# Patient Record
Sex: Female | Born: 1990 | Race: White
Health system: Southern US, Community
[De-identification: ages and names within clinical notes are randomized; demographics above are authoritative.]

## PROBLEM LIST (undated history)

## (undated) ENCOUNTER — Ambulatory Visit: Admission: RE | Payer: No Typology Code available for payment source | Source: Home / Self Care | Admitting: Obstetrics

## (undated) ENCOUNTER — Emergency Department
Disposition: A | Discharge status: Home / Self Care | Payer: Self-pay | Attending: Emergency Medicine | Admitting: Emergency Medicine

## (undated) ENCOUNTER — Observation Stay: Admission: EM | Payer: Medicaid Other | Source: Home / Self Care

## (undated) DIAGNOSIS — J349 Unspecified disorder of nose and nasal sinuses: Secondary | ICD-10-CM

## (undated) DIAGNOSIS — C801 Malignant (primary) neoplasm, unspecified: Secondary | ICD-10-CM

## (undated) DIAGNOSIS — T148XXA Other injury of unspecified body region, initial encounter: Secondary | ICD-10-CM

## (undated) DIAGNOSIS — R112 Nausea with vomiting, unspecified: Secondary | ICD-10-CM

## (undated) DIAGNOSIS — J45909 Unspecified asthma, uncomplicated: Secondary | ICD-10-CM

## (undated) DIAGNOSIS — F4310 Post-traumatic stress disorder, unspecified: Secondary | ICD-10-CM

## (undated) DIAGNOSIS — F419 Anxiety disorder, unspecified: Secondary | ICD-10-CM

## (undated) DIAGNOSIS — H9390 Unspecified disorder of ear, unspecified ear: Secondary | ICD-10-CM

## (undated) DIAGNOSIS — S0990XA Unspecified injury of head, initial encounter: Secondary | ICD-10-CM

## (undated) DIAGNOSIS — Z9889 Other specified postprocedural states: Secondary | ICD-10-CM

## (undated) DIAGNOSIS — K219 Gastro-esophageal reflux disease without esophagitis: Secondary | ICD-10-CM

## (undated) DIAGNOSIS — G43909 Migraine, unspecified, not intractable, without status migrainosus: Secondary | ICD-10-CM

## (undated) DIAGNOSIS — I10 Essential (primary) hypertension: Secondary | ICD-10-CM

## (undated) DIAGNOSIS — F1911 Other psychoactive substance abuse, in remission: Secondary | ICD-10-CM

## (undated) DIAGNOSIS — H9190 Unspecified hearing loss, unspecified ear: Secondary | ICD-10-CM

## (undated) DIAGNOSIS — K429 Umbilical hernia without obstruction or gangrene: Secondary | ICD-10-CM

## (undated) DIAGNOSIS — F53 Postpartum depression: Secondary | ICD-10-CM

## (undated) DIAGNOSIS — F319 Bipolar disorder, unspecified: Secondary | ICD-10-CM

## (undated) DIAGNOSIS — Z9109 Other allergy status, other than to drugs and biological substances: Secondary | ICD-10-CM

## (undated) DIAGNOSIS — R011 Cardiac murmur, unspecified: Secondary | ICD-10-CM

## (undated) DIAGNOSIS — K589 Irritable bowel syndrome without diarrhea: Secondary | ICD-10-CM

## (undated) DIAGNOSIS — M545 Low back pain, unspecified: Secondary | ICD-10-CM

## (undated) DIAGNOSIS — N83209 Unspecified ovarian cyst, unspecified side: Secondary | ICD-10-CM

## (undated) DIAGNOSIS — F32A Depression, unspecified: Secondary | ICD-10-CM

## (undated) DIAGNOSIS — B009 Herpesviral infection, unspecified: Secondary | ICD-10-CM

## (undated) DIAGNOSIS — B1910 Unspecified viral hepatitis B without hepatic coma: Secondary | ICD-10-CM

## (undated) DIAGNOSIS — N390 Urinary tract infection, site not specified: Secondary | ICD-10-CM

## (undated) DIAGNOSIS — J392 Other diseases of pharynx: Secondary | ICD-10-CM

## (undated) DIAGNOSIS — G8929 Other chronic pain: Secondary | ICD-10-CM

## (undated) DIAGNOSIS — R1310 Dysphagia, unspecified: Secondary | ICD-10-CM

## (undated) DIAGNOSIS — T8859XA Other complications of anesthesia, initial encounter: Secondary | ICD-10-CM

## (undated) HISTORY — PX: TYMPANOSTOMY TUBE PLACEMENT: SHX32

## (undated) HISTORY — DX: Unspecified viral hepatitis B without hepatic coma: B19.10

---
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
## 2010-01-01 LAB — URINALYSIS
Bilirubin, UA: NEGATIVE
Blood, UA: NEGATIVE
Glucose, UA: NEGATIVE
Ketones UA: NEGATIVE
Leukocyte Esterase, UA: NEGATIVE
Nitrate: NEGATIVE
Protein, UR: NEGATIVE
Specific Gravity, UR: 1.007 (ref 1.000–1.035)
Urobilinogen, UA: NORMAL
pH, Urine: 7.5 (ref 5.0–8.0)

## 2010-01-01 LAB — CBC AND DIFFERENTIAL
BASOPHILS %: 0.1 % (ref 0.0–2.0)
Baso(Absolute): 0.01 10*3/uL (ref 0.00–0.20)
Eosinophils %: 1.6 % (ref 0.0–6.0)
Eosinophils Absolute: 0.13 10*3/uL (ref 0.10–0.30)
Hematocrit: 37.4 % (ref 27.0–49.5)
Hemoglobin: 12.5 g/dL (ref 11.7–15.5)
Immature Granulocytes #: 0.01 10*3/uL (ref 0.00–0.05)
Immature Granulocytes %: 0.1 % — ABNORMAL HIGH (ref 0.0–0.0)
Lymphocytes Absolute: 4.36 10*3/uL (ref 1.00–4.80)
Lymphocytes Relative: 55.2 % — ABNORMAL HIGH (ref 25.0–55.0)
MCH: 27.3 pg (ref 27.0–34.0)
MCHC: 33.4 g/dL (ref 32.0–36.0)
MCV: 81.7 fL (ref 80–100)
MPV: 9.6 fL (ref 9.0–13.0)
Monocytes Absolute: 0.42 10*3/uL (ref 0.10–1.20)
Monocytes Relative %: 5.3 % (ref 1.0–8.0)
Neutrophils Absolute: 2.98 10*3/uL (ref 1.80–7.70)
Neutrophils Relative %: 37.8 % — ABNORMAL LOW (ref 49.0–69.0)
Nucleated RBC %: 0 /100WBC (ref 0.0–0.0)
Nucleted RBC #: 0 10*3/uL (ref 0.00–0.00)
Platelets: 202 10*3/uL (ref 150–400)
RBC: 4.58 M/uL (ref 3.80–5.40)
RDW: 13.9 % (ref 11.0–14.0)
WBC: 7.9 10*3/uL (ref 4.80–10.80)

## 2010-01-01 LAB — CHLAMYDIA TRACH/GC AMPLIFIED DETECTION
C. Trachomatis Amplified: NEGATIVE
N. Gonorrhoeae Amplified: NEGATIVE

## 2010-01-01 LAB — COMPREHENSIVE METABOLIC PANEL
ALT: 32 U/L (ref 7–56)
AST (SGOT): 20 U/L (ref 10–40)
Albumin, Synovial: 3.4 g/dL — ABNORMAL LOW (ref 3.9–5.0)
Alkaline Phosphatase: 44 U/L (ref 38–126)
BUN / Creatinine Ratio: 13 (ref 8–20)
BUN: 8 mg/dL (ref 6–20)
Bilirubin, Total: <0.1 mg/dL — ABNORMAL LOW (ref 0.2–1.3)
CO2: 26 mmol/L (ref 21.0–31.0)
Calcium: 8.7 mg/dL (ref 8.4–10.2)
Chloride: 107 mmol/L (ref 101–111)
Creatinine: 0.65 mg/dL (ref 0.52–1.04)
EGFR: >60 mL/min/{1.73_m2}
EGFR: >60 mL/min/{1.73_m2}
Glucose: 80 mg/dL (ref 70–100)
Potassium: 4.1 mmol/L (ref 3.6–5.0)
Protein, Total: 5.8 g/dL — ABNORMAL LOW (ref 6.3–8.2)
Sodium: 140 mmol/L (ref 135–145)

## 2010-01-01 LAB — SEDIMENTATION RATE, AUTOMATED: Sed Rate: 3 (ref 0–20)

## 2010-01-01 LAB — STREP A ANTIGEN (THROAT): Streptococcus A AG: NEGATIVE

## 2010-01-01 LAB — HCG, QUALITATIVE URINE: Urine HCG Qualitative: NEGATIVE

## 2010-01-01 LAB — C-REACTIVE PROTEIN, QUANTITATIVE: C-Reactive Protein, Quantitative: <0.5 mg/dL (ref 0.0–1.0)

## 2010-01-01 LAB — AMYLASE: Amylase: 32 U/L (ref 29–110)

## 2010-01-01 LAB — LIPASE: Lipase: 95 U/L (ref 23–300)

## 2010-07-09 ENCOUNTER — Emergency Department
Admission: EM | Admit: 2010-07-09 | Disposition: A | Discharge status: Home / Self Care | Payer: Self-pay | Source: Emergency Department | Admitting: Emergency Medicine

## 2010-07-09 LAB — COMPREHENSIVE METABOLIC PANEL
ALT: 149 U/L — ABNORMAL HIGH (ref 7–56)
AST (SGOT): 82 U/L — ABNORMAL HIGH (ref 10–40)
Albumin, Synovial: 4.4 g/dL (ref 3.9–5.0)
Alkaline Phosphatase: 68 U/L (ref 38–126)
BUN / Creatinine Ratio: 23 — ABNORMAL HIGH (ref 8–20)
BUN: 15 mg/dL (ref 6–20)
Bilirubin, Total: 0.8 mg/dL (ref 0.2–1.3)
CO2: 24 mmol/L (ref 21.0–31.0)
Calcium: 9.2 mg/dL (ref 8.4–10.2)
Chloride: 103 mmol/L (ref 101–111)
Creatinine: 0.64 mg/dL (ref 0.52–1.04)
EGFR: >60 mL/min/{1.73_m2}
EGFR: >60 mL/min/{1.73_m2}
Glucose: 90 mg/dL (ref 70–100)
Potassium: 4.2 mmol/L (ref 3.6–5.0)
Protein, Total: 7.3 g/dL (ref 6.3–8.2)
Sodium: 138 mmol/L (ref 135–145)

## 2010-07-09 LAB — CBC AND DIFFERENTIAL
BASOPHILS %: 0.1 % (ref 0.0–2.0)
Baso(Absolute): 0.01 10*3/uL (ref 0.00–0.20)
Eosinophils %: 0.5 % (ref 0.0–6.0)
Eosinophils Absolute: 0.04 10*3/uL — ABNORMAL LOW (ref 0.10–0.30)
Hematocrit: 40.2 % (ref 27.0–49.5)
Hemoglobin: 13.7 g/dL (ref 11.7–15.5)
Immature Granulocytes #: 0.01 10*3/uL (ref 0.00–0.05)
Immature Granulocytes %: 0.1 % — ABNORMAL HIGH (ref 0.0–0.0)
Lymphocytes Absolute: 2.27 10*3/uL (ref 1.00–4.80)
Lymphocytes Relative: 29.6 % (ref 25.0–55.0)
MCH: 28.1 pg (ref 27.0–34.0)
MCHC: 34.1 g/dL (ref 32.0–36.0)
MCV: 82.4 fL (ref 80–100)
MPV: 9.3 fL (ref 9.0–13.0)
Monocytes Absolute: 0.43 10*3/uL (ref 0.10–1.20)
Monocytes Relative %: 5.6 % (ref 1.0–8.0)
Neutrophils Absolute: 4.91 10*3/uL (ref 1.80–7.70)
Neutrophils Relative %: 64.2 % (ref 49.0–69.0)
Nucleated RBC %: 0 /100WBC (ref 0.0–0.0)
Nucleted RBC #: 0 10*3/uL (ref 0.00–0.00)
Platelets: 258 10*3/uL (ref 150–400)
RBC: 4.88 M/uL (ref 3.80–5.40)
RDW: 13.6 % (ref 11.0–14.0)
WBC: 7.66 10*3/uL (ref 4.80–10.80)

## 2010-07-09 LAB — URINALYSIS
Bilirubin, UA: NEGATIVE
Glucose, UA: NEGATIVE
Leukocyte Esterase, UA: NEGATIVE
Nitrate: NEGATIVE
Specific Gravity, UR: 1.025 (ref 1.000–1.035)
Urobilinogen, UA: NORMAL
pH, Urine: 6 (ref 5.0–8.0)

## 2010-07-09 LAB — HCG, QUALITATIVE URINE: Urine HCG Qualitative: NEGATIVE

## 2010-07-09 LAB — STREP A ANTIGEN (THROAT): Streptococcus A AG: NEGATIVE

## 2010-07-09 LAB — URINALYSIS WITH MICROSCOPIC

## 2010-08-05 ENCOUNTER — Emergency Department
Admission: EM | Admit: 2010-08-05 | Disposition: A | Discharge status: Home / Self Care | Payer: Self-pay | Source: Emergency Department | Admitting: Pediatric Emergency Medicine

## 2010-08-05 LAB — CBC AND DIFFERENTIAL
BASOPHILS %: 0.1 % (ref 0.0–2.0)
Baso(Absolute): 0.01 10*3/uL (ref 0.00–0.20)
Eosinophils %: 1.2 % (ref 0.0–6.0)
Eosinophils Absolute: 0.11 10*3/uL (ref 0.10–0.30)
Hematocrit: 38.8 % (ref 27.0–49.5)
Hemoglobin: 13.2 g/dL (ref 11.7–15.5)
Immature Granulocytes #: 0.02 10*3/uL (ref 0.00–0.05)
Immature Granulocytes %: 0.2 % — ABNORMAL HIGH (ref 0.0–0.0)
Lymphocytes Absolute: 2.96 10*3/uL (ref 1.00–4.80)
Lymphocytes Relative: 32 % (ref 25.0–55.0)
MCH: 27.9 pg (ref 27.0–34.0)
MCHC: 34 g/dL (ref 32.0–36.0)
MCV: 82 fL (ref 80–100)
MPV: 9.5 fL (ref 9.0–13.0)
Monocytes Absolute: 0.6 10*3/uL (ref 0.10–1.20)
Monocytes Relative %: 6.5 % (ref 1.0–8.0)
Neutrophils Absolute: 5.57 10*3/uL (ref 1.80–7.70)
Neutrophils Relative %: 60.2 % (ref 49.0–69.0)
Nucleated RBC %: 0 /100WBC (ref 0.0–0.0)
Nucleted RBC #: 0 10*3/uL (ref 0.00–0.00)
Platelets: 234 10*3/uL (ref 150–400)
RBC: 4.73 M/uL (ref 3.80–5.40)
RDW: 13.2 % (ref 11.0–14.0)
WBC: 9.25 10*3/uL (ref 4.80–10.80)

## 2010-08-05 LAB — URINALYSIS
Bilirubin, UA: NEGATIVE
Blood, UA: NEGATIVE
Glucose, UA: NEGATIVE
Leukocyte Esterase, UA: NEGATIVE
Nitrate: NEGATIVE
Protein, UR: NEGATIVE
Specific Gravity, UR: 1.007 (ref 1.000–1.035)
Urobilinogen, UA: NORMAL
pH, Urine: 7.5 (ref 5.0–8.0)

## 2010-08-05 LAB — COMPREHENSIVE METABOLIC PANEL
ALT: 30 U/L (ref 7–56)
AST (SGOT): 23 U/L (ref 10–40)
Albumin, Synovial: 3.9 g/dL (ref 3.9–5.0)
Alkaline Phosphatase: 56 U/L (ref 38–126)
BUN / Creatinine Ratio: 18 (ref 8–20)
BUN: 10 mg/dL (ref 6–20)
Bilirubin, Total: 0.7 mg/dL (ref 0.2–1.3)
CO2: 23 mmol/L (ref 21.0–31.0)
Calcium: 9 mg/dL (ref 8.4–10.2)
Chloride: 106 mmol/L (ref 101–111)
Creatinine: 0.55 mg/dL (ref 0.52–1.04)
EGFR: >60 mL/min/{1.73_m2}
EGFR: >60 mL/min/{1.73_m2}
Glucose: 86 mg/dL (ref 70–100)
Potassium: 4.4 mmol/L (ref 3.6–5.0)
Protein, Total: 7 g/dL (ref 6.3–8.2)
Sodium: 137 mmol/L (ref 135–145)

## 2010-08-05 LAB — HCG, QUALITATIVE URINE: Urine HCG Qualitative: NEGATIVE

## 2010-08-05 LAB — D-DIMER, QUANTITATIVE: D-Dimer, Quant.: <0.22 ug/mL-FEU (ref 0.00–0.51)

## 2010-08-07 LAB — CHLAMYDIA TRACH/GC AMPLIFIED DETECTION
C. Trachomatis Amplified: NEGATIVE
N. Gonorrhoeae Amplified: NEGATIVE

## 2010-08-16 ENCOUNTER — Emergency Department
Admission: EM | Admit: 2010-08-16 | Disposition: A | Discharge status: Home / Self Care | Payer: Self-pay | Source: Emergency Department | Admitting: Emergency Medicine

## 2010-08-16 LAB — HCG, QUANTITATIVE, PREGNANCY: hCG, Quant.: 4810.4 m[IU]/mL — ABNORMAL HIGH (ref 0.00–6.10)

## 2010-08-16 LAB — COMPREHENSIVE METABOLIC PANEL
ALT: 29 U/L (ref 7–56)
AST (SGOT): 22 U/L (ref 10–40)
Albumin, Synovial: 3.8 g/dL — ABNORMAL LOW (ref 3.9–5.0)
Alkaline Phosphatase: 53 U/L (ref 38–126)
BUN / Creatinine Ratio: 19 (ref 8–20)
BUN: 12 mg/dL (ref 6–20)
Bilirubin, Total: 0.2 mg/dL (ref 0.2–1.3)
CO2: 22 mmol/L (ref 21.0–31.0)
Calcium: 8.8 mg/dL (ref 8.4–10.2)
Chloride: 108 mmol/L (ref 101–111)
Creatinine: 0.6 mg/dL (ref 0.52–1.04)
EGFR: >60 mL/min/{1.73_m2}
EGFR: >60 mL/min/{1.73_m2}
Glucose: 77 mg/dL (ref 70–100)
Potassium: 4 mmol/L (ref 3.6–5.0)
Protein, Total: 6.2 g/dL — ABNORMAL LOW (ref 6.3–8.2)
Sodium: 140 mmol/L (ref 135–145)

## 2010-08-16 LAB — CBC AND DIFFERENTIAL
BASOPHILS %: 0.1 % (ref 0.0–2.0)
Baso(Absolute): 0.01 10*3/uL (ref 0.00–0.20)
Eosinophils %: 1.2 % (ref 0.0–6.0)
Eosinophils Absolute: 0.14 10*3/uL (ref 0.10–0.30)
Hematocrit: 37.3 % (ref 27.0–49.5)
Hemoglobin: 12.7 g/dL (ref 11.7–15.5)
Immature Granulocytes #: 0.03 10*3/uL (ref 0.00–0.05)
Immature Granulocytes %: 0.3 % — ABNORMAL HIGH (ref 0.0–0.0)
Lymphocytes Absolute: 4.33 10*3/uL (ref 1.00–4.80)
Lymphocytes Relative: 36.7 % (ref 25.0–55.0)
MCH: 28 pg (ref 27.0–34.0)
MCHC: 34 g/dL (ref 32.0–36.0)
MCV: 82.3 fL (ref 80–100)
MPV: 9.4 fL (ref 9.0–13.0)
Monocytes Absolute: 0.73 10*3/uL (ref 0.10–1.20)
Monocytes Relative %: 6.2 % (ref 1.0–8.0)
Neutrophils Absolute: 6.6 10*3/uL (ref 1.80–7.70)
Neutrophils Relative %: 55.8 % (ref 49.0–69.0)
Nucleated RBC %: 0.1 /100WBC — ABNORMAL HIGH (ref 0.0–0.0)
Nucleted RBC #: 0.02 10*3/uL — ABNORMAL HIGH (ref 0.00–0.00)
Platelets: 251 10*3/uL (ref 150–400)
RBC: 4.53 M/uL (ref 3.80–5.40)
RDW: 13.3 % (ref 11.0–14.0)
WBC: 11.81 10*3/uL — ABNORMAL HIGH (ref 4.80–10.80)

## 2010-08-16 LAB — URINALYSIS
Bilirubin, UA: NEGATIVE
Blood, UA: NEGATIVE
Glucose, UA: NEGATIVE
Ketones UA: NEGATIVE
Leukocyte Esterase, UA: NEGATIVE
Nitrate: NEGATIVE
Protein, UR: NEGATIVE
Specific Gravity, UR: 1.004 (ref 1.000–1.035)
Urobilinogen, UA: NORMAL
pH, Urine: 6 (ref 5.0–8.0)

## 2010-08-16 LAB — ABO/RH: ABO Rh: O POS

## 2010-08-16 LAB — HCG, SERUM, QUALITATIVE: Hcg Qualitative: POSITIVE

## 2010-08-16 LAB — ANTIBODY SCREEN: AB Screen Gel: NEGATIVE

## 2010-08-16 LAB — HCG, QUALITATIVE URINE: Urine HCG Qualitative: POSITIVE

## 2010-08-25 ENCOUNTER — Emergency Department
Admit: 2010-08-25 | Disposition: A | Discharge status: Home / Self Care | Payer: Self-pay | Source: Emergency Department | Admitting: Emergency Medicine

## 2010-08-25 LAB — GROUP A STREP, RAPID ANTIGEN: Group A Strep, Rapid Antigen: NEGATIVE

## 2010-08-27 LAB — THROAT CULTURE: Culture Throat: NORMAL

## 2010-11-23 ENCOUNTER — Ambulatory Visit
Admission: AD | Admit: 2010-11-23 | Disposition: A | Discharge status: Home / Self Care | Payer: Self-pay | Source: Ambulatory Visit | Attending: Obstetrics & Gynecology | Admitting: Obstetrics & Gynecology

## 2010-11-23 LAB — URINALYSIS
Bilirubin, UA: NEGATIVE
Blood, UA: NEGATIVE
Glucose, UA: NEGATIVE
Leukocyte Esterase, UA: NEGATIVE
Nitrate: NEGATIVE
Protein, UR: NEGATIVE
Specific Gravity, UR: 1.032 (ref 1.000–1.035)
Urobilinogen, UA: NORMAL
pH, Urine: 6 (ref 5.0–8.0)

## 2010-12-02 ENCOUNTER — Emergency Department
Admission: EM | Admit: 2010-12-02 | Disposition: A | Discharge status: Home / Self Care | Payer: Self-pay | Source: Emergency Department | Admitting: Emergency Medicine

## 2011-03-22 ENCOUNTER — Ambulatory Visit
Admission: AD | Admit: 2011-03-22 | Disposition: A | Discharge status: Home / Self Care | Payer: Self-pay | Source: Ambulatory Visit | Attending: Obstetrics & Gynecology | Admitting: Obstetrics & Gynecology

## 2011-04-05 DIAGNOSIS — B977 Papillomavirus as the cause of diseases classified elsewhere: Secondary | ICD-10-CM

## 2011-04-05 HISTORY — DX: Papillomavirus as the cause of diseases classified elsewhere: B97.7

## 2011-04-07 ENCOUNTER — Inpatient Hospital Stay
Admission: RE | Admit: 2011-04-07 | Disposition: A | Discharge status: Home / Self Care | Payer: Self-pay | Source: Ambulatory Visit

## 2011-04-07 ENCOUNTER — Ambulatory Visit
Admission: RE | Admit: 2011-04-07 | Discharge: 2011-04-07 | Disposition: A | Discharge status: Home / Self Care | Payer: Self-pay | Source: Ambulatory Visit | Attending: Obstetrics & Gynecology | Admitting: Obstetrics & Gynecology

## 2011-04-07 LAB — CBC AND DIFFERENTIAL
Basophils Absolute Automated: 0.01 10*3/uL (ref 0.00–0.20)
Basophils Automated: 0 % (ref 0–2)
Eosinophils Absolute Automated: 0.14 10*3/uL (ref 0.00–0.70)
Eosinophils Automated: 1 % (ref 0–5)
Hematocrit: 30.1 % — ABNORMAL LOW (ref 37.0–47.0)
Hgb: 9.6 g/dL — ABNORMAL LOW (ref 12.0–16.0)
Immature Granulocytes Absolute: 0.06 10*3/uL — ABNORMAL HIGH
Immature Granulocytes: 1 % (ref 0–1)
Lymphocytes Absolute Automated: 3.6 10*3/uL (ref 0.50–4.40)
Lymphocytes Automated: 32 % (ref 15–41)
MCH: 26.4 pg — ABNORMAL LOW (ref 28.0–32.0)
MCHC: 31.9 g/dL — ABNORMAL LOW (ref 32.0–36.0)
MCV: 82.9 fL (ref 80.0–100.0)
MPV: 9.3 fL — ABNORMAL LOW (ref 9.4–12.3)
Monocytes Absolute Automated: 0.82 10*3/uL (ref 0.00–1.20)
Monocytes: 7 % (ref 0–11)
Neutrophils Absolute: 6.76 10*3/uL (ref 1.80–8.10)
Neutrophils: 60 % (ref 52–75)
Nucleated RBC: 0 /100 WBC
Platelets: 214 10*3/uL (ref 140–400)
RBC: 3.63 10*6/uL — ABNORMAL LOW (ref 4.20–5.40)
RDW: 14 % (ref 12–15)
WBC: 11.33 10*3/uL — ABNORMAL HIGH (ref 3.50–10.80)

## 2011-04-07 LAB — TYPE AND SCREEN
AB Screen Gel: NEGATIVE
ABO Rh: O POS

## 2011-04-08 LAB — RAPID DRUG SCREEN, URINE
Barbiturate Screen, UR: NOT DETECTED
Benzodiazepine Screen, UR: NOT DETECTED
Cannabinoid Screen, UR: NOT DETECTED
Cocaine, UR: NOT DETECTED
Opiate Screen, UR: NOT DETECTED
PCP Screen, UR: NOT DETECTED
Urine Amphetamine Screen: NOT DETECTED

## 2011-05-06 ENCOUNTER — Emergency Department
Admission: EM | Admit: 2011-05-06 | Disposition: A | Discharge status: Home / Self Care | Payer: Self-pay | Source: Emergency Department | Admitting: Pediatric Emergency Medicine

## 2011-05-06 LAB — URINALYSIS
Bilirubin, UA: NEGATIVE
Blood, UA: NEGATIVE
Glucose, UA: NEGATIVE
Ketones UA: NEGATIVE
Nitrate: NEGATIVE
Protein, UR: NEGATIVE
Specific Gravity, UR: 1.012 (ref 1.000–1.035)
Urobilinogen, UA: NORMAL
pH, Urine: 6.5 (ref 5.0–8.0)

## 2011-05-06 LAB — URINALYSIS WITH MICROSCOPIC

## 2011-05-06 LAB — INFLUENZA ANTIGEN
Influenza A: NEGATIVE
Influenza B: NEGATIVE

## 2011-05-06 LAB — HCG, QUALITATIVE URINE: Urine HCG Qualitative: NEGATIVE

## 2011-05-06 LAB — STREP A ANTIGEN (THROAT): Streptococcus A AG: NEGATIVE

## 2011-08-25 ENCOUNTER — Emergency Department
Admission: EM | Admit: 2011-08-25 | Discharge: 2011-08-25 | Disposition: A | Discharge status: Home / Self Care | Payer: Self-pay | Attending: Emergency Medical Services | Admitting: Emergency Medical Services

## 2011-08-25 ENCOUNTER — Emergency Department: Payer: Self-pay

## 2011-08-25 DIAGNOSIS — Z880 Allergy status to penicillin: Secondary | ICD-10-CM | POA: Insufficient documentation

## 2011-08-25 DIAGNOSIS — F17200 Nicotine dependence, unspecified, uncomplicated: Secondary | ICD-10-CM | POA: Insufficient documentation

## 2011-08-25 DIAGNOSIS — G8929 Other chronic pain: Secondary | ICD-10-CM | POA: Insufficient documentation

## 2011-08-25 DIAGNOSIS — N949 Unspecified condition associated with female genital organs and menstrual cycle: Secondary | ICD-10-CM | POA: Insufficient documentation

## 2011-08-25 DIAGNOSIS — Z91040 Latex allergy status: Secondary | ICD-10-CM | POA: Insufficient documentation

## 2011-08-25 DIAGNOSIS — Z882 Allergy status to sulfonamides status: Secondary | ICD-10-CM | POA: Insufficient documentation

## 2011-08-25 DIAGNOSIS — Z881 Allergy status to other antibiotic agents status: Secondary | ICD-10-CM | POA: Insufficient documentation

## 2011-08-25 DIAGNOSIS — J40 Bronchitis, not specified as acute or chronic: Secondary | ICD-10-CM | POA: Insufficient documentation

## 2011-08-25 HISTORY — DX: Anxiety disorder, unspecified: F41.9

## 2011-08-25 LAB — URINALYSIS
Bilirubin, UA: NEGATIVE
Glucose, UA: NEGATIVE
Leukocyte Esterase, UA: NEGATIVE
Nitrite, UA: NEGATIVE
Protein, UR: NEGATIVE
Specific Gravity UA: 1.025 (ref 1.001–1.035)
Urine pH: 6 (ref 5.0–8.0)
Urobilinogen, UA: 1 mg/dL

## 2011-08-25 LAB — URINE MICROSCOPIC

## 2011-08-25 LAB — URINE HCG QUALITATIVE: Urine HCG Qualitative: NEGATIVE

## 2011-08-25 MED ORDER — AZITHROMYCIN 250 MG PO TABS
250.00 mg | ORAL_TABLET | Freq: Every day | ORAL | Status: AC
Start: 2011-08-25 — End: 2011-08-31

## 2011-08-25 NOTE — Discharge Instructions (Signed)
Bronchitis    You have been diagnosed with bronchitis.    Bronchitis is an irritation of the large breathing tubes. It can be caused by tobacco smoke, air pollution, or an infection. Patients with bronchitis are short of breath and may cough up green or yellow mucous. These symptoms are usually worse at night, when lying flat and in wet weather. Most people with bronchitis do not need antibiotics. If your doctor prescribes antibiotics, fill the prescription and take all the medicine according to the instructions.    Bronchitis is usually treated with medicine to help stop coughing. An inhaler with Albuterol or Ventolin is sometimes used to help with cough. It is best to use the inhaler with a spacer to help the medicine reach your lungs. The doctor can prescribe a spacer.    Bronchitis is usually caused by a virus. Antibiotics do not kill viruses. In fact, antibiotics do not affect viruses in any way. In the past, some doctors prescribed antibiotics for people with bronchitis. We now know that antibiotics are not helpful for most bronchitis patients. Patients who might need antibiotics are those with lung problems that don't go away, like emphysema or COPD.    Your coughing and wheezing might last for 2 or 3 weeks! The symptoms should get better over this time period and not worse.    Do not smoke. Research shows that smoking causes heart disease, cancer, and birth defects. Avoiding smoking will help your asthma. If you smoke, ask your doctor for ideas about how to stop.   If you do not smoke, avoid others who do.    YOU SHOULD SEEK MEDICAL ATTENTION IMMEDIATELY, EITHER HERE OR AT THE NEAREST EMERGENCY DEPARTMENT, IF ANY OF THE FOLLOWING OCCURS:   You wheeze or have trouble breathing.   You have a fever (temperature higher than 101 F or 38.3 C), that won't go away.   You have chest pain.   You vomit or cannot keep liquids down or you feel weak or dizzy.   Your symptoms get worse over the next 2 or 3  days.      Bronchitis    You have been diagnosed with bronchitis.    Bronchitis is an irritation of the large breathing tubes. It can be caused by tobacco smoke, air pollution, or an infection. Patients with bronchitis are short of breath and may cough up green or yellow mucous. These symptoms are usually worse at night, when lying flat and in wet weather. Most people with bronchitis do not need antibiotics. If your doctor prescribes antibiotics, fill the prescription and take all the medicine according to the instructions.    Bronchitis is usually treated with medicine to help stop coughing. An inhaler with Albuterol or Ventolin is sometimes used to help with cough. It is best to use the inhaler with a spacer to help the medicine reach your lungs. The doctor can prescribe a spacer.    Bronchitis is usually caused by a virus. Antibiotics do not kill viruses. In fact, antibiotics do not affect viruses in any way. In the past, some doctors prescribed antibiotics for people with bronchitis. We now know that antibiotics are not helpful for most bronchitis patients. Patients who might need antibiotics are those with lung problems that don't go away, like emphysema or COPD.    Your coughing and wheezing might last for 2 or 3 weeks! The symptoms should get better over this time period and not worse.    Do not smoke. Research  shows that smoking causes heart disease, cancer, and birth defects. Avoiding smoking will help your asthma. If you smoke, ask your doctor for ideas about how to stop.   If you do not smoke, avoid others who do.    YOU SHOULD SEEK MEDICAL ATTENTION IMMEDIATELY, EITHER HERE OR AT THE NEAREST EMERGENCY DEPARTMENT, IF ANY OF THE FOLLOWING OCCURS:   You wheeze or have trouble breathing.   You have a fever (temperature higher than 101 F or 38.3 C), that won't go away.   You have chest pain.   You vomit or cannot keep liquids down or you feel weak or dizzy.   Your symptoms get worse over the  next 2 or 3 days.      Pelvic Pain    You have been diagnosed with pelvic pain.    Pelvic pain affects many women who come here for evaluation. Your doctor has checked for the most dangerous causes of pelvic pain, such as appendicitis, pelvic abscess ectopic (tubal) pregnancy, or other complications of pregnancy. You do not have any of these problems.    There are many other causes of pelvic pain that cannot be detected with the type of testing that can be done here today. Some diseases, such as uterine fibroids or endometriosis, may cause abnormal vaginal bleeding along with pain. These problems often require the doctor to look at the pelvic organs in the operating room under general anesthesia.    After examining you today and conducting a few tests, the doctor may suspect a specific cause of your pelvic pain, like infection of the uterus or fallopian tubes (known as pelvic inflammatory disease or PID). If so, treatment can be started immediately, even though it might take a few days to get all the test results back.    Cancer is a rare cause of acute pelvic pain, but it still needs to be considered as a possible cause of your symptoms. The emergency department or urgent care clinic is rarely able to diagnose or rule out cancer as the cause of pelvic pain. Cancer screening is not part of a routine emergency evaluation.    It is VERY IMPORTANT that you follow up with your regular doctor who can evaluate all possible causes of your pain.    At this time it is safe for you to go home.    You may need to return here or go to the nearest Emergency Department if you develop symptoms that might indicate you are having complications, such as worsening infection, vaginal bleeding, or some other problem.    Be sure to follow up with your gynecologist or regular doctor in the next few days. Tell your doctor about this visit.   If you do not have a regular doctor, make sure you tell the medical staff prior to  leaving here, so that we can help make these arrangements for you.    YOU SHOULD SEEK MEDICAL ATTENTION IMMEDIATELY, EITHER HERE OR AT THE NEAREST EMERGENCY DEPARTMENT, IF ANY OF THE FOLLOWING OCCURS:   Increasingly severe pain in the abdomen (belly), pelvis or back.   Increasingly large amounts of vaginal discharge or bleeding (more than one pad per hour), or passage of large blood clots.   Fever, chills, nausea, vomiting.   Dizziness, lightheadedness, or passing out.    [facility] Referral Tenna Delaine    You have been referred to Ob/Gyn specialists, Dr. Tenna Delaine and Dr. Shelby Dubin.    Call them at [phone] today or the next  business day to schedule an appointment.    When you call the office, tell them that you were evaluated in the Emergency Department and the doctor felt that you should be seen by an Ob/Gyn specialist.    They should be able to see you within 3 business days.    Their website is [email or web address]    [facility]  [phone]  [place] [address]    [facility] [address]  [place] [address]

## 2011-08-25 NOTE — ED Notes (Signed)
Reports "bacteria infection since i've been pregnant" referring to possible UTI/vaginal infection x approx 11 months and now reports 2 days of SOB and upper back pain.

## 2011-08-25 NOTE — ED Provider Notes (Addendum)
History     Chief Complaint   Patient presents with   . Urinary Tract Infection Symptoms   . Back Pain   . Shortness of Breath     Patient is a 21 y.o. female presenting with urinary tract infection, back pain, and shortness of breath.   Urinary Tract Infection Symptoms    Back Pain     Shortness of Breath   Associated symptoms include shortness of breath.   Pt reports that she has had a UTI since the birth of her sone 14 months ago. Last antibioitics were about 3 months ago.  She complains of chronic pain, but has not returned to see her doctor since then. Today she is also concerned about a cough and shortness of breath for the last few days, associated with some numbness to the upper back. Cough is dry and not accompanied by a fever. Has a hx of bronchitis, still smoking. No PMD.    Past Medical History   Diagnosis Date   . Childhood asthma    . Anxiety        History reviewed. No pertinent past surgical history.    No family history on file.    Social  History   Substance Use Topics   . Smoking status: Current Some Day Smoker   . Smokeless tobacco: Not on file   . Alcohol Use: No       Allergies   Allergen Reactions   . Amoxicillin Anaphylaxis   . Ceclor (Cefaclor) Anaphylaxis and Rash   . Macrodantin Anaphylaxis   . Penicillins Anaphylaxis   . Flagyl (Metronidazole) Itching   . Sulfa Antibiotics Itching   . Clindamycin Rash   . Latex Rash       Current/Home Medications    NAPROXEN SODIUM (ANAPROX) 220 MG TABLET    Take 220 mg by mouth 2 (two) times daily with meals.        Review of Systems   Respiratory: Positive for shortness of breath.    Musculoskeletal: Positive for back pain.   As above,  All other systems reviewed and are negative      Physical Exam    BP 121/74  Pulse 75  Temp(Src) 97 F (36.1 C) (Oral)  Resp 18  Ht 1.676 m  Wt 91.173 kg  BMI 32.44 kg/m2  SpO2 98%  LMP 08/22/2011    Physical Exam  Obese, vitals noted.   Constitutional: Vital signs reviewed. Well appearing.  Head:  Normocephalic, atraumatic  Eyes: No conjunctival injection. No discharge.  ENT: Mucous membranes moist, OP and TM's clear,no adenopathy.   Neck: Normal range of motion. Non-tender.  Respiratory/Chest: Clear to auscultation. No respiratory distress.   Cardiovascular: Regular rate and rhythm. No murmur.   Abdomen: Soft and non-tender. No guarding. No masses or hepatosplenomegaly.  LowerExtremity: No edema. No cyanosis.  Neurological: No focal motor deficits by observation. Speech normal. Memory normal.  Skin: Warm and dry. No rash.  Lymphatic: No cervical lymphadenopathy.  Psychiatric: Normal affect. Normal concentration.    MDM and ED Course     ED Medication Orders     None           MDM      Procedures    Clinical Impression & Disposition     Clinical Impression  Final diagnoses:   None        ED Disposition     None           New Prescriptions  No medications on file               Juliane Poot, MD  08/25/11 1340    Discussed resutls with patient. Recommend GYN follow up for chronic pelvic pain. Menstruating now. Will rx for bronchitis.    Juliane Poot, MD  08/25/11 [phone]

## 2011-08-26 NOTE — ED Notes (Signed)
Follow up call. Answering machine.

## 2011-09-06 ENCOUNTER — Emergency Department: Payer: Self-pay

## 2011-09-06 ENCOUNTER — Emergency Department
Admission: EM | Admit: 2011-09-06 | Discharge: 2011-09-06 | Disposition: A | Discharge status: Home / Self Care | Payer: Self-pay | Attending: Pediatric Emergency Medicine | Admitting: Pediatric Emergency Medicine

## 2011-09-06 DIAGNOSIS — R071 Chest pain on breathing: Secondary | ICD-10-CM | POA: Insufficient documentation

## 2011-09-06 DIAGNOSIS — F17200 Nicotine dependence, unspecified, uncomplicated: Secondary | ICD-10-CM | POA: Insufficient documentation

## 2011-09-06 LAB — ECG 12-LEAD
Atrial Rate: 55 {beats}/min
P Axis: 35 degrees
P-R Interval: 168 ms
Q-T Interval: 424 ms
QRS Duration: 86 ms
QTC Calculation (Bezet): 405 ms
R Axis: 37 degrees
T Axis: 24 degrees
Ventricular Rate: 55 {beats}/min

## 2011-09-06 LAB — URINE HCG QUALITATIVE: Urine HCG Qualitative: NEGATIVE

## 2011-09-06 LAB — URINALYSIS
Bilirubin, UA: NEGATIVE
Glucose, UA: NEGATIVE
Ketones UA: NEGATIVE
Nitrite, UA: NEGATIVE
Protein, UR: NEGATIVE
Specific Gravity UA: 1.02 (ref 1.001–1.035)
Urine pH: 6 (ref 5.0–8.0)
Urobilinogen, UA: 0.2 mg/dL

## 2011-09-06 LAB — URINE MICROSCOPIC

## 2011-09-06 MED ORDER — CYCLOBENZAPRINE HCL 5 MG PO TABS
5.00 mg | ORAL_TABLET | Freq: Three times a day (TID) | ORAL | Status: DC | PRN
Start: 2011-09-06 — End: 2011-09-11

## 2011-09-06 NOTE — ED Provider Notes (Signed)
Physician/Midlevel provider first contact with patient: 67    History     Chief Complaint   Patient presents with   . Chest Pain   . Abdominal Pain     HPI Comments: Catherine Spence female presents with chest pain for over past month.  Pt seen at Urgent care about a month ago, and diagnosed with bronchitis/sinus infection, and placed on Azithromycin.  Pt cough and congestion improved.  Pt has developed new chest pain over past 3 days.  Pt pain is in upper right central chest, radiates laterally and to back.  Pt pain is not reproducible.  Pain is worse with deep breaths.  Pt also with complaints of abdominal pain localized to suprapubic area and lower back for past few days.  Pt denies any urinary symptoms, and denies any vaginal discharge.    Patient is a 21 y.o. female presenting with chest pain and abdominal pain. The history is provided by the patient. No language interpreter was used.   Chest Pain  The chest pain began more  than 1 month ago. Duration of episode(s) is 3 days. Chest pain occurs constantly. The chest pain is unchanged. The pain is associated with breathing and coughing. At its most intense, the pain is at 8/10. The pain is currently at 5/10. The severity of the pain is moderate. The quality of the pain is described as sharp and dull. The pain radiates to the upper back. Chest pain is worsened by deep breathing. Primary symptoms include abdominal pain. Pertinent negatives for primary symptoms include no fever, no shortness of breath, no cough, no wheezing, no vomiting, no dizziness and no altered mental status.   The abdominal pain began more than 2 days ago. The abdominal pain has been unchanged since its onset. The abdominal pain is located in the suprapubic region. The abdominal pain does not radiate. The severity of the abdominal pain is 4/10. The abdominal pain is relieved by nothing.   Pertinent negatives for associated symptoms include no lower extremity edema, no near-syncope, no numbness and no  weakness. She tried NSAIDs (Azithromycin) for the symptoms. Risk factors include stress and obesity.   Her past medical history is significant for anxiety/panic attacks.   Pertinent negatives for past medical history include no diabetes. Past medical history comments: Asthma       Abdominal Pain  The primary symptoms of the illness include abdominal pain. The primary symptoms of the illness do not include fever, shortness of breath, vomiting, diarrhea, dysuria or vaginal bleeding. The current episode started more than 2 days ago. The onset of the illness was gradual. The problem has not changed since onset.  The patient states that she believes she is currently not pregnant. The patient has not had a change in bowel habit. Additional symptoms associated with the illness include back pain. Symptoms associated with the illness do not include anorexia, constipation, urgency or frequency. Significant associated medical issues do not include inflammatory bowel disease, diabetes or liver disease.       Past Medical History   Diagnosis Date   . Childhood asthma    . Anxiety        History reviewed. No pertinent past surgical history.    History reviewed. No pertinent family history.    Social  History   Substance Use Topics   . Smoking status: Current Some Day Smoker   . Smokeless tobacco: Not on file   . Alcohol Use: No       Allergies  Allergen Reactions   . Amoxicillin Anaphylaxis   . Ceclor (Cefaclor) Anaphylaxis and Rash   . Macrodantin Anaphylaxis   . Penicillins Anaphylaxis   . Flagyl (Metronidazole) Itching   . Sulfa Antibiotics Itching   . Clindamycin Rash   . Latex Rash       Current/Home Medications    No medications on file        Review of Systems   Constitutional: Negative for fever, activity change and appetite change.   HENT: Negative for congestion, sore throat, facial swelling, rhinorrhea, trouble swallowing and neck pain.    Eyes: Negative for pain and discharge.   Respiratory: Negative for cough,  shortness of breath and wheezing.    Cardiovascular: Positive for chest pain. Negative for near-syncope.   Gastrointestinal: Positive for abdominal pain. Negative for vomiting, diarrhea, constipation and anorexia.   Genitourinary: Negative for dysuria, urgency, frequency, decreased urine volume, vaginal bleeding and difficulty urinating.   Musculoskeletal: Positive for back pain. Negative for myalgias and arthralgias.   Skin: Negative for color change and rash.   Neurological: Negative for dizziness, weakness, numbness and headaches.   Psychiatric/Behavioral: Negative for behavioral problems, confusion and altered mental status.       Physical Exam    BP 118/70  Pulse 71  Temp(Src) 98.4 F (36.9 C) (Temporal Artery)  Resp 18  Ht 1.676 m  Wt 102 kg  BMI 36.29 kg/m2  SpO2 98%  LMP 08/22/2011    Physical Exam   Nursing note and vitals reviewed.  Constitutional: She is oriented to person, place, and time. She appears well-developed and well-nourished. No distress.   HENT:   Head: Normocephalic and atraumatic.   Right Ear: External ear normal.   Left Ear: External ear normal.   Nose: Nose normal.   Mouth/Throat: Oropharynx is clear and moist. No oropharyngeal exudate.   Eyes: Conjunctivae are normal. Right eye exhibits no discharge. Left eye exhibits no discharge.   Neck: Normal range of motion. Neck supple. No JVD present. No tracheal deviation present.   Cardiovascular: Normal rate, regular rhythm, normal heart sounds and intact distal pulses.  Exam reveals no gallop and no friction rub.    No murmur heard.  Pulmonary/Chest: Effort normal and breath sounds normal. No stridor. No respiratory distress. She has no wheezes. She exhibits no tenderness.   Abdominal: Soft. Bowel sounds are normal. She exhibits no distension and no mass. There is tenderness (Mild suprapubic). There is no guarding.   Musculoskeletal: Normal range of motion. She exhibits no edema and no tenderness.   Lymphadenopathy:     She has no  cervical adenopathy.   Neurological: She is alert and oriented to person, place, and time. No cranial nerve deficit. She exhibits normal muscle tone. Coordination normal.   Skin: Skin is warm and dry. No rash noted. She is not diaphoretic.       MDM and ED Course     ED Medication Orders     None           MDM  Number of Diagnoses or Management Options  Chest pain on breathing: new and does not require workup  Diagnosis management comments: Diff Dx  Musculoskeletal chest pain  Gastritis  Pneumonia  Pneumothorax  Pulmonary embolism  Cardiac chest pain       Amount and/or Complexity of Data Reviewed  Clinical lab tests: reviewed  Tests in the radiology section of CPT: reviewed  Tests in the medicine section of CPT: reviewed  Risk of Complications, Morbidity, and/or Mortality  Presenting problems: low  Diagnostic procedures: low  Management options: low  General comments: 21yo female with chest pain over past month, with new chest pain over past 3 days associated with breathing.  Pt also with some suprapubic abdominal pain as well.  EKG reveals sinus bradycardia, but is otherwise normal.  CXR reveal no acute cardiopulmonary process.  UHCG is negative.  UA reveals moderate LE, and 11-25 WBC/hpf, but had many squamous epithelials.  UCx was sent.  Discussed findings with Pt, agrees with plan.  [place] with Flexeril for muscle aches, supportive care, and PMD follow up as outpatient.    Patient Progress  Patient progress: stable      Oxygen saturation by pulse oximetry is 95%-100%, Normal.  Interventions: None Needed.    EKG Interpretation:    Rhythm:  Sinus Bradycardia  Ectopy:  None  Rate:  Bradycardic  Conduction:  No blocks  ST Segments:  Normal ST segments  T Waves:  Normal T Waves  Axis:  Normal  Q Waves:  None seen  Pacing:  Not applicable  Clinical Impression:  Non-specific EKG    Procedures    Clinical Impression & Disposition     Clinical Impression  Final diagnoses:   Chest pain on breathing        ED Disposition      Discharge Catherine Spence discharge to home/self care.    Condition at discharge: Stable             New Prescriptions    CYCLOBENZAPRINE (FLEXERIL) 5 MG TABLET    Take 1 tablet (5 mg total) by mouth 3 (three) times daily as needed for Muscle spasms.               Ivan Croft, MD  09/07/11 [phone]

## 2011-09-06 NOTE — ED Notes (Signed)
Pt reports having chest pain for 4 weeks. PMD put her on zpack for bronchitis and sinusitis. No improvement while on abx. Now pt with worsening chest pain today. Lower abd pain and low back pain for 5 days.

## 2011-09-06 NOTE — Discharge Instructions (Signed)
Chest Pain of Unclear Etiology     You have been seen for chest pain. The cause of your pain is not yet known.     Your doctor has learned about your medical history, examined you, and checked any tests that were done. Still, it is unclear why you are having pain. The doctor thinks there is only a very small chance that your pain is caused by a life-threatening condition. Later, your primary care doctor might do more tests or check you again.     Sometimes chest pain is caused by a dangerous condition, like a heart attack, aorta injury, blood clot in the lung, or collapsed lung. It is unlikely that your pain is caused by a life-threatening condition if: Your chest pain lasts only a few seconds at a time; you are not short of breath, nauseated (sick to your stomach), sweaty, or lightheaded; your pain gets worse when you twist or bend; your pain improves with exercise or hard work.     Chest pain is serious. It is VERY IMPORTANT that you follow up with your regular doctor and seek medical attention immediately here or at the nearest Emergency Department if your symptoms become worse or they change.     YOU SHOULD SEEK MEDICAL ATTENTION IMMEDIATELY, EITHER HERE OR AT THE NEAREST EMERGENCY DEPARTMENT, IF ANY OF THE FOLLOWING OCCURS:  · Your pain gets worse.  · Your pain makes you short of breath, nauseated, or sweaty.  · Your pain gets worse when you walk, go up stairs, or exert yourself.  · You feel weak, lightheaded, or faint.  · It hurts to breathe.  · Your leg swells.  · Your symptoms get worse or you have new symptoms or concerns.

## 2011-09-11 ENCOUNTER — Emergency Department
Admission: EM | Admit: 2011-09-11 | Discharge: 2011-09-11 | Disposition: A | Discharge status: Home / Self Care | Payer: Self-pay | Attending: Pediatrics | Admitting: Pediatrics

## 2011-09-11 ENCOUNTER — Emergency Department: Payer: Self-pay

## 2011-09-11 DIAGNOSIS — Z883 Allergy status to other anti-infective agents status: Secondary | ICD-10-CM | POA: Insufficient documentation

## 2011-09-11 DIAGNOSIS — Z880 Allergy status to penicillin: Secondary | ICD-10-CM | POA: Insufficient documentation

## 2011-09-11 DIAGNOSIS — Z882 Allergy status to sulfonamides status: Secondary | ICD-10-CM | POA: Insufficient documentation

## 2011-09-11 DIAGNOSIS — Z91040 Latex allergy status: Secondary | ICD-10-CM | POA: Insufficient documentation

## 2011-09-11 DIAGNOSIS — B9689 Other specified bacterial agents as the cause of diseases classified elsewhere: Secondary | ICD-10-CM | POA: Insufficient documentation

## 2011-09-11 DIAGNOSIS — N760 Acute vaginitis: Secondary | ICD-10-CM | POA: Insufficient documentation

## 2011-09-11 DIAGNOSIS — F17200 Nicotine dependence, unspecified, uncomplicated: Secondary | ICD-10-CM | POA: Insufficient documentation

## 2011-09-11 LAB — CBC AND DIFFERENTIAL
Basophils Absolute Automated: 0.01 10*3/uL (ref 0.00–0.20)
Basophils Automated: 0 % (ref 0–2)
Eosinophils Absolute Automated: 0.07 10*3/uL (ref 0.00–0.70)
Eosinophils Automated: 1 % (ref 0–5)
Hematocrit: 37 % (ref 37.0–47.0)
Hgb: 12 g/dL (ref 12.0–16.0)
Immature Granulocytes Absolute: 0.01 10*3/uL
Immature Granulocytes: 0 % (ref 0–1)
Lymphocytes Absolute Automated: 3 10*3/uL (ref 0.50–4.40)
Lymphocytes Automated: 34 % (ref 15–41)
MCH: 24.5 pg — ABNORMAL LOW (ref 28.0–32.0)
MCHC: 32.4 g/dL (ref 32.0–36.0)
MCV: 75.7 fL — ABNORMAL LOW (ref 80.0–100.0)
MPV: 10.1 fL (ref 9.4–12.3)
Monocytes Absolute Automated: 0.48 10*3/uL (ref 0.00–1.20)
Monocytes: 5 % (ref 0–11)
Neutrophils Absolute: 5.36 10*3/uL (ref 1.80–8.10)
Neutrophils: 60 % (ref 52–75)
Platelets: 289 10*3/uL (ref 140–400)
RBC: 4.89 10*6/uL (ref 4.20–5.40)
RDW: 14 % (ref 12–15)
WBC: 8.92 10*3/uL (ref 3.50–10.80)

## 2011-09-11 LAB — URINALYSIS
Bilirubin, UA: NEGATIVE
Glucose, UA: NEGATIVE
Ketones UA: NEGATIVE
Leukocyte Esterase, UA: NEGATIVE
Nitrite, UA: NEGATIVE
Protein, UR: NEGATIVE
Specific Gravity UA: 1.03 (ref 1.001–1.035)
Urine pH: 6 (ref 5.0–8.0)
Urobilinogen, UA: 0.2 mg/dL

## 2011-09-11 LAB — URINE MICROSCOPIC

## 2011-09-11 LAB — HCG, SERUM, QUALITATIVE: Hcg Qualitative: NEGATIVE

## 2011-09-11 MED ORDER — CLINDAMYCIN PHOSPHATE 100 MG VA SUPP
100.00 mg | Freq: Every evening | VAGINAL | Status: AC
Start: 2011-09-11 — End: 2011-09-14

## 2011-09-11 MED ORDER — KETOROLAC TROMETHAMINE 30 MG/ML IJ SOLN
INTRAMUSCULAR | Status: DC
Start: 2011-09-11 — End: 2011-09-11
  Filled 2011-09-11: qty 1

## 2011-09-11 MED ORDER — KETOROLAC TROMETHAMINE 30 MG/ML IJ SOLN
30.00 mg | Freq: Once | INTRAMUSCULAR | Status: AC
Start: 2011-09-11 — End: 2011-09-11
  Administered 2011-09-11: 30 mg via INTRAVENOUS

## 2011-09-11 NOTE — ED Provider Notes (Addendum)
Physician/Midlevel provider first contact with patient: 1726    History     Chief Complaint   Patient presents with   . Pelvic Pain   . Vaginal Bleeding     Patient is a 21 y.o. female presenting with pelvic pain and vaginal bleeding. The history is provided by the patient and a significant other. No language interpreter was used.   Pelvic Pain  This is a new problem. The current episode started in the past 7 days. The problem occurs intermittently. The problem has been rapidly worsening. Associated symptoms include abdominal pain and nausea. Pertinent negatives include no congestion, coughing, fever, headaches, rash, sore throat or vomiting. Nothing aggravates the symptoms. She has tried nothing for the symptoms.   Vaginal Bleeding  This is a new problem. The current episode started 1 to 4 weeks ago. The problem occurs intermittently. Associated symptoms include abdominal pain and nausea. Pertinent negatives include no congestion, coughing, fever, headaches, rash, sore throat or vomiting.   21 yo well until 2 weeks prior when she had a normal period.  Since then intermittent spotting and lower abdominal pain.  Seen last week, urine HCG neg.  Today was having intercourse and suddenly had much worse vaginal and pelvic pain.  Also with foul smelling pink vaginal discharge.    Past Medical History   Diagnosis Date   . Childhood asthma    . Anxiety        No past surgical history on file.    No family history on file.    Social  History   Substance Use Topics   . Smoking status: Current Some Day Smoker   . Smokeless tobacco: Not on file   . Alcohol Use: No       Allergies   Allergen Reactions   . Amoxicillin Anaphylaxis   . Ceclor (Cefaclor) Anaphylaxis and Rash   . Macrodantin Anaphylaxis   . Penicillins Anaphylaxis   . Flagyl (Metronidazole) Itching   . Sulfa Antibiotics Itching   . Clindamycin Rash   . Latex Rash       Current/Home Medications    No medications on file        Review of Systems   Constitutional:  Negative for fever, activity change and appetite change.   HENT: Negative for congestion and sore throat.    Eyes: Negative for discharge and redness.   Respiratory: Negative for cough and wheezing.    Gastrointestinal: Positive for nausea and abdominal pain. Negative for vomiting and diarrhea.   Genitourinary: Positive for vaginal bleeding, vaginal discharge, vaginal pain, pelvic pain and dyspareunia. Negative for dysuria, urgency, hematuria and decreased urine volume.   Musculoskeletal: Positive for back pain (unchanged).   Skin: Negative for color change and rash.   Neurological: Negative for seizures and headaches.       Physical Exam    BP 117/71  Pulse 79  Temp(Src) 97.9 F (36.6 C) (Temporal Artery)  Resp 18  Ht 1.676 m  Wt 103.5 kg  BMI 36.85 kg/m2  SpO2 100%  LMP 08/22/2011    Physical Exam   Constitutional: She is oriented to person, place, and time. She appears well-developed and well-nourished. She is active.  Non-toxic appearance. She does not appear ill. No distress.   HENT:   Head: Normocephalic and atraumatic.   Right Ear: Tympanic membrane and external ear normal. No drainage.   Left Ear: Tympanic membrane and external ear normal. No drainage.   Mouth/Throat: No oral lesions. No oropharyngeal exudate.  Eyes: EOM are normal. Pupils are equal, round, and reactive to light. Right eye exhibits no discharge. Left eye exhibits no discharge. Right conjunctiva is not injected. Left conjunctiva is not injected.   Neck: Normal range of motion. Neck supple.   Cardiovascular: Normal rate, regular rhythm and normal pulses.    No murmur heard.  Pulmonary/Chest: Not tachypneic. No respiratory distress. She has no decreased breath sounds. She has no wheezes.   Abdominal: Soft. Normal appearance. She exhibits no distension. There is tenderness in the suprapubic area. There is no rigidity, no rebound, no guarding and no CVA tenderness.        Obese abdomen, soft but tender suprapubic  No rebound or guarding    Genitourinary: No labial fusion. There is no rash, tenderness, lesion or injury on the right labia. There is no rash, tenderness, lesion or injury on the left labia. Cervix exhibits no motion tenderness, no discharge and no friability. Right adnexum displays tenderness (mild). Left adnexum displays no tenderness. No erythema, tenderness or bleeding around the vagina. No foreign body around the vagina. No signs of injury around the vagina. Vaginal discharge found.   Musculoskeletal:        Normal ROM, no deformity or edema   Lymphadenopathy:     She has no cervical adenopathy.   Neurological: She is alert and oriented to person, place, and time. No cranial nerve deficit or sensory deficit.   Skin: Skin is warm, dry and intact. No rash noted. No erythema.   Psychiatric: Her speech is normal and behavior is normal. Thought content normal. Her affect is not inappropriate. Cognition and memory are normal.       MDM and ED Course     ED Medication Orders     None           MDM  Number of Diagnoses or Management Options  Diagnosis management comments: I, Allene Dillon, MD, have been the primary provider for Catherine Spence during this Emergency Dept visit.    DDX  PID  Vaginitis  Pregnancy   Ovarian pathology    Pt signed out to Dr. Dory Peru at 7 pm, introduced at the bedside        Procedures      Radiology Results (24 Hour)     Procedure Component Value Units Date/Time    US Pelvic with Transvaginal [[id number]] Collected:09/11/11 2057    Order Status:Completed  Updated:09/11/11 2103    Narrative:    HISTORY: 21 year old female with pelvic pain and vaginal bleeding.  Negative beta-hCG.     FINDINGS: Transabdominal and transvaginal pelvic ultrasound were  performed. Transvaginal exam was used to better evaluate the endometrium  and adnexa. Color-flow and spectral Doppler imaging of the ovaries was  performed to assess for torsion.     The uterus measures 8.5 x 4.3 x 5.2 cm and is normal in size and  echotexture. No  fibroids or other focal lesions are seen. The  endometrium is homogeneous in appearance and measures 1.2 mm in double  wall thickness.     The right ovary measures 4.0 x 2.6 x 3.4 cm and the left ovary 3.1 x 1.9  x 1.8 cm. The ovaries appear normal. There is a 2.2 cm follicle in the  right ovary. No adnexal masses or fluid collections are seen. There is  normal symmetric arterial and venous Doppler demonstrated in both  ovaries. There is no evidence of ovarian torsion.       Impression:  Impression:  Normal pelvic ultrasound. No evidence of ovarian torsion.    US Abdomen/Pelvis Art/Vein Visceral Duplex [employer] [[id number]] Collected:09/11/11 2057    Order Status:Completed  Updated:09/11/11 2103    Narrative:    HISTORY: 21 year old female with pelvic pain and vaginal bleeding.  Negative beta-hCG.     FINDINGS: Transabdominal and transvaginal pelvic ultrasound were  performed. Transvaginal exam was used to better evaluate the endometrium  and adnexa. Color-flow and spectral Doppler imaging of the ovaries was  performed to assess for torsion.     The uterus measures 8.5 x 4.3 x 5.2 cm and is normal in size and  echotexture. No fibroids or other focal lesions are seen. The  endometrium is homogeneous in appearance and measures 1.2 mm in double  wall thickness.     The right ovary measures 4.0 x 2.6 x 3.4 cm and the left ovary 3.1 x 1.9  x 1.8 cm. The ovaries appear normal. There is a 2.2 cm follicle in the  right ovary. No adnexal masses or fluid collections are seen. There is  normal symmetric arterial and venous Doppler demonstrated in both  ovaries. There is no evidence of ovarian torsion.       Impression:    Impression:  Normal pelvic ultrasound. No evidence of ovarian torsion.        Clinical Impression & Disposition     Clinical Impression  Final diagnoses:   Bacterial vaginosis        ED Disposition     Discharge CEIL RODERICK discharge to home/self care.    Condition at discharge: Stable             New  Prescriptions    CLINDAMYCIN (CLEOCIN) 100 MG VAGINAL SUPPOSITORY    Place 1 suppository (100 mg total) vaginally nightly.               Allene Dillon, MD  09/11/11 0865    Allene Dillon, MD  09/12/11 7846    Allene Dillon, MD  09/12/11 [phone]

## 2011-09-11 NOTE — ED Notes (Signed)
Spotting a few days ago. Spotting prior to having sex, painful intercourse today. Douched last night because of a foul smell and had some pink. Not on birth controlled stopped in may. Concerned because of the spotting. For the past 2 weeks with headache. Pt with lower pelvic pain/cramps. Last period was a normal period.

## 2011-09-11 NOTE — Discharge Instructions (Signed)
BACTERIAL VAGINOSIS    You have a bacterial infection of the vagina called bacterial baginosis (also called BV, gardnerella, or non-specific vaginitis). This occurs when the "bad" bacteria outnumber the "good" bacteria that are normally present in the vagina. Symptoms include foul-smelling vaginal discharge (most noticeable after vaginal intercourse). There may also be burning with urination. The burning is caused as the urine passes over the inflamed outer vaginal area.  The cause of bacterial vaginosis is not certain. However, your risk is higher if you recently began a new sexual relationship, or have had many sex partners in the past. Your risk is also higher if you douche often.  While bacterial vaginosis most often occurs only in sexually active women, this is not a true sexually transmitted disease. You did not get this from your partner. You cannot give it to your partner. The infection may be related to temporary changes in the pH of vaginal fluids after being exposed to semen.  HOME CARE:   Keep the genital area clean and free of discharge. Do this by wearing an absorbent sanitary pad and changing it often. Shower daily. When you shower, clean the outer vaginal area with plain soap and water.   Do not douche during treatment unless advised to do so by your doctor. Routine douching after treatment is no longer recommended to clean the vagina. It raises your risk of vaginal infection and pelvic inflammatory disease.   Avoid having sex until you have finished all antibiotic medicine and all symptoms have gone away.   Wear cotton underwear or cotton-lined panty hose. Don't wear pants that are too tight.   Limiting the number of sex partners you have lowers your risk of this and other vaginal infections, STDs, and HIV.   Take all medicine as directed until it is gone, even if you are feeling better. If you don't do this, symptoms might return.  FOLLOW UP with your doctor if symptoms don't go away after  the medicine is finished.  GET PROMPT MEDICAL ATTENTION if any of the following occur:   Fever of 100.39F (38C) or higher, or as directed by your healthcare provider   Lower abdominal pain   Rash or joint pain   Painful sores around the outer vaginal area or on your partner's penis   [address], [address] [place], [place] [address]. All rights reserved. This information is not intended as a substitute for professional medical care. Always follow your healthcare professional's instructions.

## 2011-10-23 ENCOUNTER — Emergency Department
Admission: EM | Admit: 2011-10-23 | Discharge: 2011-10-23 | Disposition: A | Discharge status: Home / Self Care | Payer: Self-pay | Attending: Pediatric Emergency Medicine | Admitting: Pediatric Emergency Medicine

## 2011-10-23 ENCOUNTER — Emergency Department: Payer: Self-pay

## 2011-10-23 DIAGNOSIS — Z91040 Latex allergy status: Secondary | ICD-10-CM | POA: Insufficient documentation

## 2011-10-23 DIAGNOSIS — J329 Chronic sinusitis, unspecified: Secondary | ICD-10-CM | POA: Insufficient documentation

## 2011-10-23 DIAGNOSIS — Z882 Allergy status to sulfonamides status: Secondary | ICD-10-CM | POA: Insufficient documentation

## 2011-10-23 DIAGNOSIS — N898 Other specified noninflammatory disorders of vagina: Secondary | ICD-10-CM | POA: Insufficient documentation

## 2011-10-23 HISTORY — DX: Depression, unspecified: F32.A

## 2011-10-23 LAB — CBC AND DIFFERENTIAL
Basophils Absolute Automated: 0.02 10*3/uL (ref 0.00–0.20)
Basophils Automated: 0 % (ref 0–2)
Eosinophils Absolute Automated: 0.14 10*3/uL (ref 0.00–0.70)
Eosinophils Automated: 1 % (ref 0–5)
Hematocrit: 38.3 % (ref 37.0–47.0)
Hgb: 12.2 g/dL (ref 12.0–16.0)
Lymphocytes Absolute Automated: 3.76 10*3/uL (ref 0.50–4.40)
Lymphocytes Automated: 38 % (ref 15–41)
MCH: 24 pg — ABNORMAL LOW (ref 28.0–32.0)
MCHC: 31.9 g/dL — ABNORMAL LOW (ref 32.0–36.0)
MCV: 75.4 fL — ABNORMAL LOW (ref 80.0–100.0)
MPV: 9.9 fL (ref 9.4–12.3)
Monocytes Absolute Automated: 0.57 10*3/uL (ref 0.00–1.20)
Monocytes: 6 % (ref 0–11)
Neutrophils Absolute: 5.27 10*3/uL (ref 1.80–8.10)
Neutrophils: 54 % (ref 52–75)
Platelets: 285 10*3/uL (ref 140–400)
RBC: 5.08 10*6/uL (ref 4.20–5.40)
RDW: 15 % (ref 12–15)
WBC: 9.76 10*3/uL (ref 3.50–10.80)

## 2011-10-23 LAB — URINALYSIS
Bilirubin, UA: NEGATIVE
Blood, UA: NEGATIVE
Glucose, UA: NEGATIVE
Ketones UA: NEGATIVE
Leukocyte Esterase, UA: NEGATIVE
Nitrite, UA: NEGATIVE
Protein, UR: NEGATIVE
Specific Gravity UA: 1.032 (ref 1.001–1.035)
Urine pH: 6 (ref 5.0–8.0)
Urobilinogen, UA: 0.2 mg/dL

## 2011-10-23 LAB — HCG QUANTITATIVE: hCG, Quant.: <1.2 m[IU]/mL

## 2011-10-23 LAB — URINE HCG QUALITATIVE: Urine HCG Qualitative: NEGATIVE

## 2011-10-23 MED ORDER — IBUPROFEN 200 MG PO TABS
600.00 mg | ORAL_TABLET | Freq: Once | ORAL | Status: AC
Start: 2011-10-23 — End: 2011-10-23
  Administered 2011-10-23: 600 mg via ORAL
  Filled 2011-10-23: qty 3

## 2011-10-23 MED ORDER — AZITHROMYCIN 250 MG PO TABS
250.00 mg | ORAL_TABLET | Freq: Every day | ORAL | Status: AC
Start: 2011-10-23 — End: 2011-10-29

## 2011-10-23 MED ORDER — IBUPROFEN 600 MG PO TABS
600.00 mg | ORAL_TABLET | Freq: Four times a day (QID) | ORAL | Status: AC | PRN
Start: 2011-10-23 — End: 2011-11-02

## 2011-10-23 NOTE — Discharge Instructions (Signed)
Sinusitis    You have been seen for a sinus infection.    Sinus infections are common. They often happen after people have a virus or a common cold.    Symptoms are: Pain in the face, green or yellow mucous from the nose, fevers and chills. There may also be a feeling of fullness or pressure in the face.    Decongestants may help. This helps the sinuses drain. Sometimes a steroid spray is used.    You may need antibiotics for the infection. Not all cases of sinusitis need antibiotics. Viruses cause most sinusitis. Antibiotics do not work on viruses. Sometimes sinusitis and be caused by bacteria. These cases usually get better with medicine to help the sinuses drain. Antibiotics should be given only to patients who have symptoms for more than 2 weeks and if they have fevers, severe sinus pain and pus mixed in with the mucus.    YOU SHOULD SEEK MEDICAL ATTENTION IMMEDIATELY, EITHER HERE OR AT THE NEAREST EMERGENCY DEPARTMENT, IF ANY OF THE FOLLOWING OCCURS:   You do not get better with treatment.   You have severe headaches and high fevers or any confusion.   You have worse pain in the face. Your face gets more swollen.

## 2011-10-23 NOTE — ED Notes (Signed)
Three weeks ago, patient was prescribed an antibiotic for her vaginal infection. Patient however developed an allergic reaction from the medication and discontinued medication. Patient now is complaining of abdominal pain and a pressure headache. Patient also developed diarrhea and nausea.

## 2011-10-23 NOTE — ED Provider Notes (Signed)
Physician/Midlevel provider first contact with patient: 10/23/11 1651         History     Chief Complaint   Patient presents with   . Sinusitis   . Abdominal Pain     Patient is a 21 y.o. female presenting with sinusitis and female genitourinary complaint. The history is provided by the patient.   Sinusitis   This is a new problem. Episode onset: 3 weeks. The problem has been gradually worsening. There has been no fever. The pain is at a severity of 5/10. The pain is moderate. The pain has been constant since onset. Associated symptoms include congestion, sinus pressure and cough. Pertinent negatives include no chills, no ear pain and no sore throat. She has tried nothing for the symptoms.   Female GU Problem  Primary symptoms include discharge, genital odor and vaginal bleeding.  Primary symptoms include no pelvic pain, no genital pain, no genital rash and no dysuria. Primary symptoms comment: pt has had vaginal spotting on and off for the past few weeks.  This is a new (3 weeks) problem. The problem has not changed since onset.She has missed her period (lmp was early june). The discharge was green and yellow. Associated symptoms include abdominal pain. Pertinent negatives include no vomiting and no frequency. Associated symptoms comments: Pt states feels bloating in the lower abd, not necessarily, pain, but has bloating. . She has tried nothing for the symptoms.       Past Medical History   Diagnosis Date   . Childhood asthma    . Anxiety    . Depression        History reviewed. No pertinent past surgical history.    No family history on file.    Social  History   Substance Use Topics   . Smoking status: Current Some Day Smoker   . Smokeless tobacco: Not on file   . Alcohol Use: No       .     Allergies   Allergen Reactions   . Amoxicillin Anaphylaxis   . Ceclor (Cefaclor) Anaphylaxis and Rash   . Macrodantin Anaphylaxis   . Penicillins Anaphylaxis   . Flagyl (Metronidazole) Itching   . Sulfa Antibiotics Itching    . Clindamycin Rash   . Latex Rash       Current/Home Medications    No medications on file        Review of Systems   Constitutional: Negative for fever and chills.   HENT: Positive for congestion, rhinorrhea, postnasal drip and sinus pressure. Negative for ear pain, nosebleeds and sore throat.    Eyes: Negative for photophobia and visual disturbance.   Respiratory: Positive for cough.    Gastrointestinal: Positive for abdominal pain. Negative for vomiting.   Genitourinary: Positive for vaginal bleeding and vaginal discharge. Negative for dysuria, urgency, frequency, flank pain, vaginal pain and pelvic pain.   Musculoskeletal: Negative for back pain and arthralgias.   All other systems reviewed and are negative.        Physical Exam    BP 108/55  Pulse 70  Temp(Src) 98.9 F (37.2 C) (Temporal Artery)  Resp 18  Ht 1.676 m  Wt 104 kg  BMI 37.01 kg/m2  SpO2 100%  LMP 09/06/2011    Physical Exam   Nursing note and vitals reviewed.  Constitutional: She is oriented to person, place, and time. She appears well-developed and well-nourished.   HENT:   Head: Normocephalic and atraumatic.   Right Ear: External ear  normal.   Left Ear: External ear normal.   Nose: Mucosal edema and rhinorrhea present. No epistaxis.  No foreign bodies. Right sinus exhibits frontal sinus tenderness. Left sinus exhibits frontal sinus tenderness.   Eyes: EOM are normal. Pupils are equal, round, and reactive to light.   Neck: Normal range of motion. No Brudzinski's sign noted.   Cardiovascular: Normal rate, regular rhythm and normal heart sounds.    Pulmonary/Chest: Effort normal and breath sounds normal.   Abdominal: Soft. Bowel sounds are normal. She exhibits no distension. There is no tenderness. There is no rebound and no guarding.        Denies any pain currently. C/o feeling bloated.    Genitourinary: There is no rash or tenderness on the right labia. There is no rash or tenderness on the left labia. Cervix exhibits no motion  tenderness, no discharge and no friability. Right adnexum displays no mass and no tenderness. Left adnexum displays no mass and no tenderness. No tenderness or bleeding around the vagina. No signs of injury around the vagina. Vaginal discharge found.   Musculoskeletal: Normal range of motion. She exhibits no edema and no tenderness.   Neurological: She is alert and oriented to person, place, and time.   Skin: Skin is warm and dry.       MDM and ED Course     ED Medication Orders      Start     Status Ordering Provider    10/23/11 1915   ibuprofen (ADVIL,MOTRIN) tablet 600 mg   Once      Route: Oral  Ordered Dose: 600 mg         Last MAR action:  Given [name]                 MDM  Number of Diagnoses or Management Options  Sinusitis:   Diagnosis management comments: Pt here for two complaints    #1) HA and sinus infection x 3 weeks. States she has had frontal facial pain with moderate nasal congestion and mucous. Having to blow her nose constantly. Forehead pain worse with bending down. Denies any fevers or chills. No sore throat Has had mild cough for the past 2 weeks. Sx consistent with sinusitis. Non toxic appearing and no signs of sepsis. NO meningeal signs.     #2) Vaginal d/c for the past 3 weeks. Was seen in the ED last month and dx with vaginosis. Took one pill of clinda and felt funny so stopped taking it. Still having discharge, yellowish. States she had a regular period Early June, and only had vaginal spotting early July Did   not have a regular period in July and wondering if she is pregnant.  Pelvic shows minimal d/c. Abd soft and nontender. No signs of appy or torsion. Pt is not pregnant. NO signs of PID and no UTI. Wet mount negative. Sx consistent with dysfunctional uterine bleeding.       I, Carolina Sink- Physician Assistant, have been the primary provider for this pt during their ED stay.     The attending signature signifies review and agreement of the history, physical examination,  evaluation, clinical impression and plan except as noted.     02 sat  99%ra, which is nml.           Procedures    Clinical Impression & Disposition     Clinical Impression  Final diagnoses:   Sinusitis        ED Disposition  Discharge Catherine Spence discharge to home/self care.    Condition at discharge: Stable             New Prescriptions    AZITHROMYCIN (ZITHROMAX Z-PAK) 250 MG TABLET    Take 1 tablet (250 mg total) by mouth daily. 2 tablets on day one, then 1 tablet on days 2 through 5    IBUPROFEN (ADVIL,MOTRIN) 600 MG TABLET    Take 1 tablet (600 mg total) by mouth every 6 (six) hours as needed for Pain or Fever.               Mariel Aloe [place], [place]  10/23/11 2225

## 2011-10-24 NOTE — ED Provider Notes (Signed)
I have reviewed the notes, assessments, and/or procedures performed by Carolina Sink, PA, I concur with her/his documentation of Catherine Spence.      Ivan Croft, MD  10/24/11 [name]

## 2011-11-30 ENCOUNTER — Emergency Department: Payer: Self-pay

## 2011-11-30 ENCOUNTER — Emergency Department
Admission: EM | Admit: 2011-11-30 | Discharge: 2011-11-30 | Disposition: A | Discharge status: Home / Self Care | Payer: Self-pay | Attending: Pediatric Emergency Medicine | Admitting: Pediatric Emergency Medicine

## 2011-11-30 DIAGNOSIS — R51 Headache: Secondary | ICD-10-CM | POA: Insufficient documentation

## 2011-11-30 DIAGNOSIS — F17200 Nicotine dependence, unspecified, uncomplicated: Secondary | ICD-10-CM | POA: Insufficient documentation

## 2011-11-30 DIAGNOSIS — R079 Chest pain, unspecified: Secondary | ICD-10-CM | POA: Insufficient documentation

## 2011-11-30 LAB — URINALYSIS
Bilirubin, UA: NEGATIVE
Blood, UA: NEGATIVE
Glucose, UA: NEGATIVE
Ketones UA: NEGATIVE
Leukocyte Esterase, UA: NEGATIVE
Nitrite, UA: NEGATIVE
Protein, UR: NEGATIVE
Specific Gravity UA: 1.01 (ref 1.001–1.035)
Urine pH: 7.5 (ref 5.0–8.0)
Urobilinogen, UA: 0.2 mg/dL

## 2011-11-30 LAB — URINE HCG QUALITATIVE: Urine HCG Qualitative: NEGATIVE

## 2011-11-30 LAB — GROUP A STREP, RAPID ANTIGEN: Group A Strep, Rapid Antigen: NEGATIVE

## 2011-11-30 MED ORDER — IBUPROFEN 200 MG PO TABS
600.00 mg | ORAL_TABLET | Freq: Once | ORAL | Status: AC
Start: 2011-11-30 — End: 2011-11-30
  Administered 2011-11-30: 600 mg via ORAL
  Filled 2011-11-30: qty 3

## 2011-11-30 NOTE — ED Notes (Signed)
Pt reports having chest pain for several days. This am began with sob and increased left sided chest/rib pain. Nausea but no vomiting. Diarrhea for 1 week but constipated for 2 days. Afebrile at home.

## 2011-11-30 NOTE — ED Provider Notes (Signed)
Physician/Midlevel provider first contact with patient: 11/30/11 1103         EMERGENCY DEPARTMENT NOTE    Date: 11/30/2011  Patient Name: [facility]    History of Presenting Illness     Chief Complaint   Patient presents with   . Shortness of Breath   . Chest Pain     Catherine Spence is a 21 y.o. female who presents with HA, sore throat, cough and congestion for 2-3 days, with acute onset chest pain and palpitations today. Pt notes HA is intermittent, located bifrontal, and is c/w migraines she has had in past. Sore throat primarily when she swallows. Mild cough which is non-productive and worse in the evenings, along with nasal congestion and rhinorrhea which has been persistent. Pt has had tactile fevers and mild nausea. Today, while she was sitting, she began to feel sudden, sharp chest pain located just inferior to her left anterior rib. Pain is worse with deep inspiration, direct palpation and movement. This pain was accompanied by palpitations. She denies experiencing this pain before, no h/o cardiac issues or syncope. She has taken no analgesia. She denies any known trauma or injury and did not have this pain previously. No FH of cardiac disease or sudden death, no h/o clotting, stroke or DVT in patient or family. Pt denies using any medications or drugs. Pt uncertain if she could be pregnant.         This history was obtained from the patient.     PCP: Christa See, MD    Past Medical History     Past Medical History   Diagnosis Date   . Childhood asthma    . Anxiety    . Depression        Past Surgical History   History reviewed. No pertinent past surgical history.    Family History   No family history on file.    Social History     History     Social History   . Marital Status: Significant Other     Spouse Name: N/A     Number of Children: N/A   . Years of Education: N/A     Social History Main Topics   . Smoking status: Current Some Day Smoker     Types: Cigarettes   . Smokeless tobacco: Not on  file   . Alcohol Use: No   . Drug Use: Yes     Special: Marijuana   . Sexually Active: Not on file     Other Topics Concern   . Not on file     Social History Narrative   . No narrative on file     Pt currently lives in shelter    Allergies     Allergies   Allergen Reactions   . Amoxicillin Anaphylaxis   . Ceclor (Cefaclor) Anaphylaxis and Rash   . Macrodantin Anaphylaxis   . Penicillins Anaphylaxis   . Flagyl (Metronidazole) Itching   . Sulfa Antibiotics Itching   . Clindamycin Rash   . Latex Rash       Medications   No current facility-administered medications for this encounter.  No current outpatient prescriptions on file.    Review of Systems     Constitutional: Tactile fever, no significant changes in appetite  Eyes: No eye redness. No changes in vision  Ears: No ear pain, no ear drainage  Nose: Mild nasal congestion and rhinorrhea  Throat: + sore throat  Respiratory: Mild cough with acute shortness  of breath  GI: No vomiting or diarrhea.  Genitourinary: No dysuria or hematuria  Musculoskeletal: No swelling or edema  Neuro: Headache but no dizziness  Skin: No rash or skin lesions.    Physical Exam   BP 117/68  Pulse 75  Temp(Src) 97 F (36.1 C) (Temporal Artery)  Resp 18  Wt 104 kg  SpO2 97%  LMP 11/16/2011    Constitutional: Vital signs reviewed. Obese female. Well appearing, well hydrated, well perfused, non-toxic appearing, no apparent distress  Head:  Normocephalic, atraumatic  Eyes: PERRL, normal conjunctiva bilaterally, EOMI  ENT: Normal posterior pharynx. No injection, pus, ulcerations or blisters., Mucous membranes moist and Normal tongue. Lips not cracked.. Both external auditory canals normal. Both TMs normal with normal landmarks and light reflex.  Neck: Normal range of motion. Non-tender.   Respiratory/Chest: No tachypnea or increased WOB, shallow respirations but clear to auscultation bilaterally without appreciable wheezing or crackles. TTP over left anterior costal margin over the lower  rib border, no bruising or erythema.  Cardiovascular: Regular rate and rhythm. No murmur. 2+ peripheral pulses   Abdomen: Obese abdomen, but soft and nontender in all quadrants. No guarding or rebound. No masses or hepatosplenomegaly., bowel sounds normal and no distention.  UpperExtremity: No edema or cyanosis.  LowerExtremity: No edema or cyanosis. No swelling or discoloration, negative Homans signs  Neurological: Awake and alert. No focal motor deficits by observation.  Skin: Warm and dry. No rash.  Lymphatic: No cervical lymphadenopathy.      Diagnostic Study Results     Labs -     Results     Procedure Component Value Units Date/Time    Urinalysis [[id number]] Collected:11/30/11 1308    Specimen Information:Urine Updated:11/30/11 1342     Urine Type Clean Catch      Color, UA YELLOW      Clarity, UA CLEAR      Specific Gravity UA 1.010      Urine pH 7.5      Leukocytes, UA NEGATIVE      Nitrite, UA NEGATIVE      Protein, UA NEGATIVE      Glucose, UA NEGATIVE      Ketones UA NEGATIVE      Urobilinogen, UA 0.2 mg/dL      Bilirubin, UA NEGATIVE      Blood, UA NEGATIVE     Urine Culture [[id number]] Collected:11/30/11 1304    Specimen Information:Urine / Urine, Clean Catch Updated:11/30/11 1304    Rapid Strep [[id number]] Collected:11/30/11 1131    Specimen Information:Throat Updated:11/30/11 1148     Group A Strep, Rapid Antigen Negative     Urine HCG Qualitative [[id number]] Collected:11/30/11 1131    Specimen Information:Urine Updated:11/30/11 1141     Urine HCG Qualitative Negative           Radiologic Studies -   Radiology Results (24 Hour)     Procedure Component Value Units Date/Time    Chest 2 Views [[id number]] Collected:11/30/11 1232    Order Status:Completed  Updated:11/30/11 1248    Narrative:    History:  Left lower chest pain.      COMPARISON: 09/06/2011 chest radiograph. 08/25/2008 chest CT.     The heart size and contour are normal.  Lungs are clear with normal  pulmonary vascularity.  No pneumothorax,  pleural effusion, hilar or  mediastinal prominence is evident. The thoracic osseous structures  appear intact.       Impression:          No active  disease is seen in the chest.      .    Clinical Course in the Emergency Department     11:38 AM - EKG: normal EKG, 62 bpm, normal sinus rhythm with sinus arrhythmia, no ST changes, normal PR, QRS and QT interval    13:00 - Reassessed pt, she notes significant improvement in pain after motrin. However, as we were discussing supportive care management, she notes that she has been having frequent urination recently, despite no increase in drinking. She denies dysuria or hematuria. Will check UA and urine culture    14:00 - Reassessed pt, discussed findings of UA which did not reveal any glycosuria or concern for UTI.     Medical Decision Making   I am the first provider for this patient.  I reviewed the vital signs, nursing notes, past medical history, past surgical history, family history and social history.  Vital Signs - Patient Vitals for the past 12 hrs:   BP Temp Pulse Resp   11/30/11 1052 117/68 mmHg 97 F (36.1 C) 75  18      Pulse Oximetry Analysis - Normal  Differential Diagnosis (not completely inclusive): Arrhythmia, Pneumonia, costochondral chest pain, acute viral illness, UTI  Laboratory results reviewed by EDP: Yes  Radiologic study results reviewed by EDP: Yes  Radiologic Studies Interpreted (viewed) by EDP: Yes    21 y/o obese female with vague c/o cough, congestion, HA and tactile fevers, now with acute chest pain. Chest pain appears to be musculoskeletal in etiology both by HPI, PE, and good response to NSAIDs. CXR and ECG not concerning for additional etiology of pain. Pt is likely suffering from an acute viral illness which would account for HA, sore throat, cough, congestion and tactile fever. Pt well appearing in ED, supportive care instructions discussed and will follow up with PCP.     Diagnosis and Treatment Plan     Presumptive Diagnosis:    Final diagnoses:   Chest pain   Headache     ED Disposition     Discharge Catherine Spence discharge to home/self care.    Condition at discharge: Good            Treatment Plan: Supportive care instructions discussed, reviewed indications for return to ED. Follow up with PCP.         Greig Right, MD  11/30/11 [phone]

## 2011-11-30 NOTE — Discharge Instructions (Signed)
CHEST WALL PAIN: "COSTOCHONDRITIS"    The chest pain that you have had today is caused by "Costochondritis."This condition is due to an inflammation of the cartilage joining the ribs to the breastbone. It is not caused by heart or lung problems. Although the exact cause for costochondritis is not known, it often occurs during times of emotional stress. It can be painful, but it is not dangerous. It usually disappears within one to two weeks, but may recur. Rarely, a more serious condition may cause symptoms similar to costochondritis; therefore, watch for the warning signs listed below.  HOME CARE:   If you feel that emotional stress is a cause of your condition, try to identify sources of that stress. It may not be obvious! Learn ways to deal with the stress in your life such as regular exercise, muscle relaxation, meditation, or simply taking time out for yourself. For more information about this, consult your doctor or go to a local bookstore and review books and tapes available on the subject of "stress reduction."   You may use acetaminophen (Tylenol) or ibuprofen (Motrin, Advil) to control pain, unless another pain medicine was prescribed. [ NOTE: If you have liver disease or ever had a stomach ulcer, talk with your doctor before using these medicines.]   The use of heat (hot wet compress or heating pad) with or without local analgesic creams (Deep Heat Rub, Crista Elliot) will be helpful to reduce pain.  FOLLOW UP with your doctor as directed or sooner if you do not start to improve within the next two days.  [NOTE: If an X-ray or EKG (cardiogram) was made, another specialist will review it. You will be notified of any new findings that may affect your care.]  GET PROMPT MEDICAL ATTENTION if any of the following occur:   A change in the type of pain: if it feels different, becomes more severe, lasts longer, or spreads into your shoulder, arm, neck, jaw or back   Shortness of breath or increased pain with  breathing   Weakness, dizziness, or fainting   Cough with dark colored sputum (phlegm) or blood   Abdominal pain   Dark red or black stools   Fever of 100.68F (38C) or higher, or as directed by your healthcare provider   [address], [address] [place], [place] [address]. All rights reserved. This information is not intended as a substitute for professional medical care. Always follow your healthcare professional's instructions.

## 2011-11-30 NOTE — ED Notes (Signed)
PT DISCHARGED BY Megan Salon RN

## 2011-11-30 NOTE — ED Notes (Signed)
I did not see this pt.    Forest Gleason M.D.        Forest Gleason, MD  11/30/11 1056

## 2011-12-01 LAB — ECG 12-LEAD
Atrial Rate: 62 {beats}/min
P Axis: 49 degrees
P-R Interval: 174 ms
Q-T Interval: 422 ms
QRS Duration: 88 ms
QTC Calculation (Bezet): 428 ms
R Axis: 22 degrees
T Axis: 25 degrees
Ventricular Rate: 62 {beats}/min

## 2011-12-29 LAB — ECG 12-LEAD
Atrial Rate: 54 {beats}/min
P Axis: 23 degrees
P-R Interval: 170 ms
Q-T Interval: 428 ms
QRS Duration: 84 ms
QTC Calculation (Bezet): 405 ms
R Axis: 30 degrees
T Axis: 32 degrees
Ventricular Rate: 54 {beats}/min

## 2011-12-30 LAB — ECG 12-LEAD
Atrial Rate: 74 {beats}/min
P Axis: 54 degrees
P-R Interval: 162 ms
Q-T Interval: 412 ms
QRS Duration: 82 ms
QTC Calculation (Bezet): 457 ms
R Axis: 53 degrees
T Axis: 47 degrees
Ventricular Rate: 74 {beats}/min

## 2012-04-04 NOTE — L&D Delivery Note (Signed)
Delivery Summary for Catherine Spence,  Patient's last menstrual period was 03/19/2012., Estimated Date of Delivery: 12/24/12    Pt admitted at: 12/17/2012  6:29 AM    Patient Active Problem List   Diagnosis   . Abdominal pain complicating pregnancy   . Umbilical hernia   . Pregnancy   . [redacted] weeks gestation of pregnancy   . SOB (shortness of breath)   . Post term pregnancy       Time of Delivery: N/A  12/17/2012     Obstetrician:   Rolene Arbour, MD    Procedure: Spontaneous vaginal delivery    Anesthesia:      A female  infant was delivered from  LOA presentation.    Infant Wt:     7lbs 4 oz    Infant Name:   ASIA      Apgars:   at and  at 5 minutes    ROM Time: 0924 12/17/12  Amniotic Fluid: Clear       Placenta and Cord:          Mechanism:       Description:      Episiotomy:     Lacerations:      Laceration Type:    Episiotomy/Laceration Repair:  none     Estimated Blood Loss:   200 cc's        Specimens: none           Complications:  none

## 2012-04-20 ENCOUNTER — Emergency Department
Admission: EM | Admit: 2012-04-20 | Discharge: 2012-04-20 | Disposition: A | Discharge status: Home / Self Care | Payer: No Typology Code available for payment source | Attending: Emergency Medicine | Admitting: Emergency Medicine

## 2012-04-20 ENCOUNTER — Emergency Department: Payer: No Typology Code available for payment source

## 2012-04-20 DIAGNOSIS — S62639A Displaced fracture of distal phalanx of unspecified finger, initial encounter for closed fracture: Secondary | ICD-10-CM | POA: Insufficient documentation

## 2012-04-20 DIAGNOSIS — X58XXXA Exposure to other specified factors, initial encounter: Secondary | ICD-10-CM | POA: Insufficient documentation

## 2012-04-20 DIAGNOSIS — F17200 Nicotine dependence, unspecified, uncomplicated: Secondary | ICD-10-CM | POA: Insufficient documentation

## 2012-04-20 DIAGNOSIS — O99891 Other specified diseases and conditions complicating pregnancy: Secondary | ICD-10-CM | POA: Insufficient documentation

## 2012-04-20 DIAGNOSIS — J45909 Unspecified asthma, uncomplicated: Secondary | ICD-10-CM | POA: Insufficient documentation

## 2012-04-20 LAB — URINE HCG QUALITATIVE: Urine HCG Qualitative: POSITIVE — AB

## 2012-04-20 MED ORDER — IBUPROFEN 600 MG PO TABS
600.00 mg | ORAL_TABLET | Freq: Once | ORAL | Status: DC
Start: 2012-04-20 — End: 2012-04-20
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
## 2012-04-20 NOTE — ED Notes (Signed)
Pt refusing pain meds at this time. 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

## 2012-04-20 NOTE — ED Notes (Signed)
Care complete.
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
## 2012-04-20 NOTE — Discharge Instructions (Signed)
Tylenol for pain.    Avoid Advil, Motrin, Ibuprofen, Aleve, Naproxen and similar medications during pregnancy.    Phalanx Fracture, Finger    You have been diagnosed with a fracture of a bone in your finger.    A fracture is a break in a bone. It means the same thing as saying a "broken bone." In general fractures heal over about 6-8 weeks. The broken bone will eventually become stronger at the site of the break than in the surrounding area. At first, fractures are often treated with a splint. Although the splint will help keep your finger from moving, it may be replaced with a cast when you visit an orthopedic (bone) doctor. Most fractures heal using a splint or cast, but some require surgery. An orthopedic doctor will help decide if your fracture needs surgery.    Fractures are treated with medication to reduce pain, a splint or cast to reduce movement, and Resting, Icing, Compressing and Elevating the injured area. Remember this as "RICE."   REST : Limit the use of the injured body part.   ICE: By applying ice to the affected area, swelling and pain can be reduced. Place some ice cubes in a re-sealable (Ziploc) bag and add some water. Put a thin washcloth between the bag and the skin. Apply the ice bag to the area for at least 20 minutes. Do this at least 4 times per day. Using the ice for longer times and more frequently is OK. NEVER APPLY ICE DIRECTLY TO THE SKIN.   COMPRESS: Compression means to apply pressure around the injured area such as with a splint, cast or an ace bandage. Compression decreases swelling and improves comfort. Compression should be tight enough to relieve swelling but not so tight as to decrease circulation. Increasing pain, numbness, tingling, or change in skin color, are all signs of decreased circulation.   ELEVATE: Elevate the injured part. For example, a fractured arm can be elevated by placing the arm in a sling while awake or by propping it up on pillows while lying  down.    You have been given a SPLINT to decrease pain and help to keep the injured area from moving. You should use the splint until you follow up with the referral orthopedic (bone) doctor.    Use the following SPLINT CARE instructions frequently throughout the day:   Check capillary refill (circulation) in the fingernails by pressing on the nail and then releasing it. The nail bed should turn white when you press on it and then return to pink in less than 2 seconds after you let go.   Watch for swelling of the area beyond the splint.   If the skin of the hand or fingers is excessively cold, pale, or numb to the touch, the splint may be too tight. You can loosen the wrap holding the splint in place, or you can return here or go to the nearest Emergency Department to have it adjusted.    YOU SHOULD SEEK MEDICAL ATTENTION IMMEDIATELY, EITHER HERE OR AT THE NEAREST EMERGENCY DEPARTMENT, IF ANY OF THE FOLLOWING OCCURS:   You experience a severe increase in pain or swelling in the affected area.   You develop new numbness and tingling in or below the affected area.   You develop a cold, pale finger that appears to have a problem with its blood supply.      Pregnancy    It has been determined that you are pregnant.  You originally came here to be treated for another problem, but pregnancy will affect the way we evaluate and treat your medical condition.    You have been referred to an obstetrician [facility] doctor). Make sure you call to make your appointment today. Close follow-up with an OB doctor is very important for you and your baby.    Make sure you obtain follow-up care as soon as possible, certainly within the next few days. Inform your doctor or obstetrician of your visit here and your pregnancy.    If you smoke, stop. This is necessary to protect the health of your fetus (developing baby) and yourself. Smoking makes you more likely to have a miscarriage (lose your pregnancy).    Abusing alcohol or  drugs will hurt your fetus (developing baby). It may cause a miscarriage, abnormal development, premature birth, or severe health or mental problems after birth. Avoid these substances from now until the end of your pregnancy.    If you are taking pain medications, antipsychotics, antidepressants, or seizure medication, check with your physician as soon as possible to see if you should continue with these medications.    If you develop pain or cramping in the abdomen (belly) or pelvis, vaginal bleeding, nausea/vomiting, dizziness/lightheadedness or passing out, then you should return here or go to the nearest Emergency Department immediately.    Keep yourself well-hydrated at all times by drinking a lot of fluid, and eat a balanced, healthy, nutritious diet.    You should take prenatal vitamins every day. These may be prescribed for you on this visit, or you may need to get them through your follow-up care provider. You can also purchase an over-the-counter equivalent that may be a lot less expensive. Ask you pharmacist for details.

## 2012-04-28 LAB — RUBELLA ANTIBODY, IGG: Rubella AB, IgG: IMMUNE

## 2012-04-28 LAB — RPR: RPR: NONREACTIVE

## 2012-05-02 ENCOUNTER — Emergency Department: Payer: No Typology Code available for payment source

## 2012-05-02 ENCOUNTER — Other Ambulatory Visit: Payer: No Typology Code available for payment source

## 2012-05-02 ENCOUNTER — Emergency Department
Admission: EM | Admit: 2012-05-02 | Discharge: 2012-05-02 | Disposition: A | Discharge status: Home / Self Care | Payer: No Typology Code available for payment source | Attending: Emergency Medicine | Admitting: Emergency Medicine

## 2012-05-02 DIAGNOSIS — J069 Acute upper respiratory infection, unspecified: Secondary | ICD-10-CM | POA: Insufficient documentation

## 2012-05-02 DIAGNOSIS — O99891 Other specified diseases and conditions complicating pregnancy: Secondary | ICD-10-CM | POA: Insufficient documentation

## 2012-05-02 DIAGNOSIS — J029 Acute pharyngitis, unspecified: Secondary | ICD-10-CM | POA: Insufficient documentation

## 2012-05-02 DIAGNOSIS — N949 Unspecified condition associated with female genital organs and menstrual cycle: Secondary | ICD-10-CM | POA: Insufficient documentation

## 2012-05-02 LAB — URINALYSIS
Bilirubin, UA: NEGATIVE
Blood, UA: NEGATIVE
Glucose, UA: NEGATIVE
Ketones UA: NEGATIVE
Leukocyte Esterase, UA: NEGATIVE
Nitrite, UA: NEGATIVE
Protein, UR: NEGATIVE
Specific Gravity UA: 1.03 (ref 1.001–1.035)
Urine pH: 6 (ref 5.0–8.0)
Urobilinogen, UA: 0.2 mg/dL

## 2012-05-02 LAB — COMPREHENSIVE METABOLIC PANEL
ALT: 13 U/L (ref 0–55)
AST (SGOT): 16 U/L (ref 5–34)
Albumin/Globulin Ratio: 1.2 (ref 0.9–2.2)
Albumin: 3.4 g/dL — ABNORMAL LOW (ref 3.5–5.0)
Alkaline Phosphatase: 62 U/L (ref 40–150)
Anion Gap: 8 (ref 5.0–15.0)
BUN: 12 mg/dL (ref 7.0–19.0)
Bilirubin, Total: 0.3 mg/dL (ref 0.2–1.2)
CO2: 23 mEq/L (ref 22–29)
Calcium: 8.9 mg/dL (ref 8.5–10.5)
Chloride: 108 mEq/L — ABNORMAL HIGH (ref 98–107)
Creatinine: 0.8 mg/dL (ref 0.6–1.0)
Globulin: 2.8 g/dL (ref 2.0–3.6)
Glucose: 96 mg/dL (ref 70–100)
Potassium: 4.3 mEq/L (ref 3.5–5.1)
Protein, Total: 6.2 g/dL (ref 6.0–8.3)
Sodium: 139 mEq/L (ref 136–145)

## 2012-05-02 LAB — CBC AND DIFFERENTIAL
Basophils Absolute Automated: 0.02 10*3/uL (ref 0.00–0.20)
Basophils Automated: 0 %
Eosinophils Absolute Automated: 0.21 10*3/uL (ref 0.00–0.70)
Eosinophils Automated: 3 %
Hematocrit: 37.8 % (ref 37.0–47.0)
Hgb: 12.4 g/dL (ref 12.0–16.0)
Immature Granulocytes Absolute: 0.02 10*3/uL
Immature Granulocytes: 0 %
Lymphocytes Absolute Automated: 1.89 10*3/uL (ref 0.50–4.40)
Lymphocytes Automated: 29 %
MCH: 25.6 pg — ABNORMAL LOW (ref 28.0–32.0)
MCHC: 32.8 g/dL (ref 32.0–36.0)
MCV: 77.9 fL — ABNORMAL LOW (ref 80.0–100.0)
MPV: 9.5 fL (ref 9.4–12.3)
Monocytes Absolute Automated: 0.52 10*3/uL (ref 0.00–1.20)
Monocytes: 8 %
Neutrophils Absolute: 3.78 10*3/uL (ref 1.80–8.10)
Neutrophils: 59 %
Platelets: 224 10*3/uL (ref 140–400)
RBC: 4.85 10*6/uL (ref 4.20–5.40)
RDW: 14 % (ref 12–15)
WBC: 6.42 10*3/uL (ref 3.50–10.80)

## 2012-05-02 LAB — GROUP A STREP, RAPID ANTIGEN: Group A Strep, Rapid Antigen: NEGATIVE

## 2012-05-02 LAB — HCG QUANTITATIVE: hCG, Quant.: 14785.08 m[IU]/mL

## 2012-05-02 LAB — TYPE AND SCREEN
AB Screen Gel: NEGATIVE
ABO Rh: O POS

## 2012-05-02 LAB — GFR: EGFR: >60

## 2012-05-02 MED ORDER — AZITHROMYCIN 250 MG PO TABS
250.00 mg | ORAL_TABLET | Freq: Every day | ORAL | Status: AC
Start: 2012-05-02 — End: 2012-05-08

## 2012-05-02 MED ORDER — ONDANSETRON 4 MG PO TBDP
4.00 mg | ORAL_TABLET | Freq: Four times a day (QID) | ORAL | Status: AC | PRN
Start: 2012-05-02 — End: 2012-05-09

## 2012-05-02 NOTE — ED Notes (Signed)
Pt has symptoms of uri for four days. Reports positive pregnancy test. Runny nose, cough, sore throat, vomiting related to coughing.

## 2012-05-02 NOTE — Discharge Instructions (Signed)
Pharyngitis    You have been diagnosed with pharyngitis.    Pharyngitis is an infection of the back of the throat. Most sore throats are caused by viruses and do not require antibiotics. Some sore throats are caused by bacteria. Antibiotics will help this type of sore throat. A test for Strep throat may be used to help in your diagnosis.    Symptoms of pharyngitis include fever, sore throat, and a hoarse voice. If you have cold symptoms such as sneezing and coughing, runny nose, or congestion, your sore throat is more likely to be caused by a virus and not bacteria.    Whether your sore throat is caused by a virus or bacteria, you may need medication for pain and fever. You should also drink a lot of fluid. If your sore throat is caused by bacteria, you will also need antibiotics. If your sore throat is caused by a virus, you do not need antibiotics. Antibiotics will not kill the virus and they may cause side-effects, like diarrhea, abdominal cramps, or allergic reactions. Taking an antibiotic that you do not need may cause "resistance," meaning that antibiotic won't work in the future when you have a true bacterial infection.    YOU SHOULD SEEK MEDICAL ATTENTION IMMEDIATELY, EITHER HERE OR AT THE NEAREST EMERGENCY DEPARTMENT, IF ANY OF THE FOLLOWING OCCURS:   You have difficulty breathing.   Your voices changes or seems muffled.   You have trouble swallowing.   You have a fever that won't go away (temperature greater than 100.4 F).   You feel worse or do not improve after 2 to 3 days.      Pelvic Pain    You have been diagnosed with pelvic pain.    Pelvic pain affects many women who come here for evaluation. Your doctor has checked for the most dangerous causes of pelvic pain, such as appendicitis, pelvic abscess ectopic (tubal) pregnancy, or other complications of pregnancy. You do not have any of these problems.    There are many other causes of pelvic pain that cannot be detected with the type of  testing that can be done here today. Some diseases, such as uterine fibroids or endometriosis, may cause abnormal vaginal bleeding along with pain. These problems often require the doctor to look at the pelvic organs in the operating room under general anesthesia.    After examining you today and conducting a few tests, the doctor may suspect a specific cause of your pelvic pain, like infection of the uterus or fallopian tubes (known as pelvic inflammatory disease or PID). If so, treatment can be started immediately, even though it might take a few days to get all the test results back.    Cancer is a rare cause of acute pelvic pain, but it still needs to be considered as a possible cause of your symptoms. The emergency department or urgent care clinic is rarely able to diagnose or rule out cancer as the cause of pelvic pain. Cancer screening is not part of a routine emergency evaluation.    It is VERY IMPORTANT that you follow up with your regular doctor who can evaluate all possible causes of your pain.    At this time it is safe for you to go home.    You may need to return here or go to the nearest Emergency Department if you develop symptoms that might indicate you are having complications, such as worsening infection, vaginal bleeding, or some other problem.  Be sure to follow up with your gynecologist or regular doctor in the next few days. Tell your doctor about this visit.   If you do not have a regular doctor, make sure you tell the medical staff prior to leaving here, so that we can help make these arrangements for you.    YOU SHOULD SEEK MEDICAL ATTENTION IMMEDIATELY, EITHER HERE OR AT THE NEAREST EMERGENCY DEPARTMENT, IF ANY OF THE FOLLOWING OCCURS:   Increasingly severe pain in the abdomen (belly), pelvis or back.   Increasingly large amounts of vaginal discharge or bleeding (more than one pad per hour), or passage of large blood clots.   Fever, chills, nausea, vomiting.   Dizziness,  lightheadedness, or passing out.      Pregnancy    It has been determined that you are pregnant.    You originally came here to be treated for another problem, but pregnancy will affect the way we evaluate and treat your medical condition.    You have been referred to an obstetrician [facility] doctor). Make sure you call to make your appointment today. Close follow-up with an OB doctor is very important for you and your baby.    Make sure you obtain follow-up care as soon as possible, certainly within the next few days. Inform your doctor or obstetrician of your visit here and your pregnancy.    If you smoke, stop. This is necessary to protect the health of your fetus (developing baby) and yourself. Smoking makes you more likely to have a miscarriage (lose your pregnancy).    Abusing alcohol or drugs will hurt your fetus (developing baby). It may cause a miscarriage, abnormal development, premature birth, or severe health or mental problems after birth. Avoid these substances from now until the end of your pregnancy.    If you are taking pain medications, antipsychotics, antidepressants, or seizure medication, check with your physician as soon as possible to see if you should continue with these medications.    If you develop pain or cramping in the abdomen (belly) or pelvis, vaginal bleeding, nausea/vomiting, dizziness/lightheadedness or passing out, then you should return here or go to the nearest Emergency Department immediately.    Keep yourself well-hydrated at all times by drinking a lot of fluid, and eat a balanced, healthy, nutritious diet.    You should take prenatal vitamins every day. These may be prescribed for you on this visit, or you may need to get them through your follow-up care provider. You can also purchase an over-the-counter equivalent that may be a lot less expensive. Ask you pharmacist for details.      Upper Respiratory Infection    You have been diagnosed with a viral upper  respiratory infection, often called a "URI."    The symptoms of a viral upper respiratory infection may include fever, runny nose, congestion and sinus fullness, facial pain, earache, sore throat, cough, and occasionally wheezing. The symptoms usually improve within 3 to 4 days. It might take up to 10 days before you feel completely better.    URI's are treated with fluids, rest, and medication for fever and pain. Sometimes decongestants, cough medications or-medications for wheezing can also help.    Antibiotics have NO effect whatsoever on viruses and ARE NOT needed for a URI. Taking antibiotics when they are not necessary can cause side effects (like diarrhea or allergic reactions). It can also cause resistance, meaning antibiotics won't work when you need them in the future.    You do  not need to follow up with a doctor unless you feel worse or have other concerns.    YOU SHOULD SEEK MEDICAL ATTENTION IMMEDIATELY, EITHER HERE OR AT THE NEAREST EMERGENCY DEPARTMENT, IF ANY OF THE FOLLOWING OCCURS:   You have new or worse symptoms or concerns.   You are short of breath.   You have a severe headache, stiff neck, confusion, or problems thinking.   You have nasal discharge, fever, or productive cough (one that brings up mucous from the lungs) lasting more that 10 days. Occasionally a viral cold may lead into a bacterial infection, such as a sinus infection, ear infection or pneumonia. Taking antibiotics during the cold will NOT prevent these infections, but if a secondary infection occurs, you might require additional treatment.

## 2012-05-02 NOTE — ED Provider Notes (Signed)
Physician/Midlevel provider first contact with patient: 05/02/12 1520         History     Chief Complaint   Patient presents with   . URI   . Vaginal Bleeding     HPI Comments: [id number] currently approx [redacted] weeks pregnant here with LLQ pain, mild vaginal spotting and cough productive of yellow , brown sputum.  Was sent to the ED by her new OB DR Dorinda Hill for eval.  NO F/C/N/V/urinary c/o.  C/O cough productive of yellow/green sputum and sore throat.    Patient is a 22 y.o. female presenting with female genitourinary complaint. The history is provided by the patient and a relative. No language interpreter was used.   Female GU Problem  Primary symptoms include discharge, pelvic pain and vaginal bleeding.  Primary symptoms include no dysuria. There has been no fever. This is a new problem. The current episode started yesterday. The problem occurs rarely. The problem has been resolved. The symptoms occur at rest. She is pregnant. Her LMP was months ago. The discharge was white. Associated symptoms include abdominal pain. Pertinent negatives include no nausea, no vomiting, no frequency and no dizziness. She has tried nothing for the symptoms. The treatment provided no relief. Sexual activity: sexually active. She uses nothing for contraception. Associated medical issues do not include STD or gynecological surgery.       Past Medical History   Diagnosis Date   . Childhood asthma    . Anxiety    . Depression        History reviewed. No pertinent past surgical history.    No family history on file.    Social  History   Substance Use Topics   . Smoking status: Former Smoker     Types: Cigarettes   . Smokeless tobacco: Not on file   . Alcohol Use: No       .     Allergies   Allergen Reactions   . Amoxicillin Anaphylaxis   . Ceclor (Cefaclor) Anaphylaxis and Rash   . Macrodantin Anaphylaxis   . Penicillins Anaphylaxis   . Flagyl (Metronidazole) Itching   . Sulfa Antibiotics Itching   . Clindamycin Rash   . Latex Rash        Current/Home Medications    No medications on file        Review of Systems   Constitutional: Negative for fever and chills.   HENT: Negative for congestion and sore throat.    Eyes: Negative for pain and redness.   Respiratory: Negative for cough and shortness of breath.    Cardiovascular: Negative for chest pain and palpitations.   Gastrointestinal: Positive for abdominal pain. Negative for nausea and vomiting.   Genitourinary: Positive for vaginal bleeding, vaginal discharge and pelvic pain. Negative for dysuria, frequency and vaginal pain.   Musculoskeletal: Negative for back pain.   Neurological: Negative for dizziness and headaches.   Psychiatric/Behavioral: Negative.        Physical Exam    BP 115/56  Pulse 91  Temp 97.8 F (36.6 C) (Temporal Artery)  Resp 18  Ht 1.676 m  Wt 108.863 kg  BMI 38.76 kg/m2  SpO2 98%  LMP 03/20/2012    Physical Exam   Constitutional: She is oriented to person, place, and time. She appears well-developed and well-nourished.   HENT:   Head: Normocephalic and atraumatic.   Eyes: EOM are normal. Pupils are equal, round, and reactive to light.   Neck: Normal range of motion. Neck  supple.   Cardiovascular: Normal rate, regular rhythm and normal heart sounds.    Pulmonary/Chest: Effort normal and breath sounds normal.   Abdominal: Soft. Bowel sounds are normal.   Genitourinary: Uterus normal. Cervix exhibits no motion tenderness and no friability. Right adnexum displays no mass and no tenderness. Left adnexum displays no mass and no tenderness. There is bleeding around the vagina. No tenderness around the vagina. No foreign body around the vagina. Vaginal discharge found.   Musculoskeletal: Normal range of motion.   Neurological: She is alert and oriented to person, place, and time.   Skin: Skin is warm and dry.   Psychiatric: She has a normal mood and affect.       MDM and ED Course     ED Medication Orders     None           MDM  Number of Diagnoses or Management  Options  Normal IUP (intrauterine pregnancy) on prenatal ultrasound:   Pelvic pain:   Pharyngitis:   Upper respiratory infection:   Diagnosis management comments: I, Louis Matte, MD, have been the primary provider for Catherine Spence during this Emergency Dept visit.    Due to reasons beyond my control in the EPIC system,  timed orders including medications, IV fluids and drips show up as posted 30-45 minutes AFTER they were entered by me.  Nurses know and are notified verbally to give medications STAT in the ED.    Oxygen saturation by pulse oximetry is 95%-100%, Normal.  Interventions: None Needed    DDX to include but not limited to: Miscarriage, ectopic, ovarian torsion, ovarian cysts, abnormal vaginal/dysfunctional bleeding  Plan:  Labs, quant BHCG, T & S, labs, [place], re-eval    Labs Reviewed  CBC AND DIFFERENTIAL - Abnormal; Notable for the following:      MCV                           77.9 (*)                       MCH                           25.6 (*)                    All other components within normal limits  URINALYSIS - Abnormal; Notable for the following:      Clarity, UA                   CLOUDY (*)                    All other components within normal limits  COMPREHENSIVE METABOLIC PANEL - Abnormal; Notable for the following:      Chloride                      108 (*)                        Albumin                       3.4 (*)                     All other components within normal limits  HCG QUANTITATIVE  TYPE AND SCREEN  WET PREP TRICHOMONAS         Narrative: ORDER#: [id number]                                    ORDERED BY: Maisie Fus, Danaija Eskridge                  SOURCE: Cervical Swab                                COLLECTED:  05/02/12 17:55                  ANTIBIOTICS AT COLL.:                                RECEIVED :  05/02/12 17:59                  Wet Prep Trichomonas                       FINAL       05/02/12 18:21                  05/02/12   Rare WBCs Seen                             No  Trichomonas Seen                             No Yeast/Fungal Elements Seen                             No Clue Cells Seen                             Reference Range: No Trichomonas or Yeast Seen                    GFR  RAPID INFLUENZA A/B ANTIGENS         Narrative: ORDER#: [id number]                                    ORDERED BY: Louis Matte                  SOURCE: Nasal Wash                                   COLLECTED:  05/02/12 16:09                  ANTIBIOTICS AT COLL.:                                RECEIVED :  05/02/12 16:12                  Influenza Rapid Antigen A&B                FINAL  05/02/12 16:34                  05/02/12   Negative for Influenza A and B                             Reference Range: Negative                    GROUP A STREP, RAPID ANTIGEN  CHLAMYDIA/GC BY PCR  THROAT CULTURE    US OB < 14 WEEKS W TRANSVAG         Final Result:  Single living intrauterine pregnancy measuring 5 weeks 6          days by crown-rump length.       IUP identified.  Probable viral URI/pharyngitis.  NO ectopic.  F/U with her OB Dr Dorinda Hill suggested.                  .             Amount and/or Complexity of Data Reviewed  Clinical lab tests: ordered and reviewed  Tests in the radiology section of CPT: ordered and reviewed  Tests in the medicine section of CPT: ordered and reviewed    Risk of Complications, Morbidity, and/or Mortality  Presenting problems: high  Diagnostic procedures: high  Management options: high    Patient Progress  Patient progress: improved        Procedures    Clinical Impression & Disposition     Clinical Impression  Final diagnoses:   Upper respiratory infection   Normal IUP (intrauterine pregnancy) on prenatal ultrasound   Pelvic pain   Pharyngitis        ED Disposition     Discharge Catherine Spence discharge to home/self care.    Condition at discharge: Good             New Prescriptions    AZITHROMYCIN (ZITHROMAX Z-PAK) 250 MG TABLET    Take 1 tablet (250 mg total) by mouth  daily. 2 tablets on day one, then 1 tablet on days 2 through 5    ONDANSETRON (ZOFRAN ODT) 4 MG DISINTEGRATING TABLET    Take 1 tablet (4 mg total) by mouth every 6 (six) hours as needed for Nausea.               Oliver Barre, MD  05/03/12 [id number]

## 2012-08-16 ENCOUNTER — Ambulatory Visit: Payer: No Typology Code available for payment source

## 2012-08-16 ENCOUNTER — Ambulatory Visit
Admission: RE | Admit: 2012-08-16 | Discharge: 2012-08-16 | Disposition: A | Discharge status: Home / Self Care | Payer: No Typology Code available for payment source | Attending: Emergency Medicine | Admitting: Emergency Medicine

## 2012-08-16 ENCOUNTER — Ambulatory Visit: Payer: No Typology Code available for payment source | Admitting: Obstetrics & Gynecology

## 2012-08-16 DIAGNOSIS — N760 Acute vaginitis: Secondary | ICD-10-CM | POA: Insufficient documentation

## 2012-08-16 DIAGNOSIS — T07XXXA Unspecified multiple injuries, initial encounter: Secondary | ICD-10-CM | POA: Insufficient documentation

## 2012-08-16 DIAGNOSIS — R109 Unspecified abdominal pain: Secondary | ICD-10-CM | POA: Insufficient documentation

## 2012-08-16 DIAGNOSIS — O2390 Unspecified genitourinary tract infection in pregnancy, unspecified trimester: Secondary | ICD-10-CM | POA: Insufficient documentation

## 2012-08-16 DIAGNOSIS — O99891 Other specified diseases and conditions complicating pregnancy: Secondary | ICD-10-CM | POA: Insufficient documentation

## 2012-08-16 DIAGNOSIS — B9689 Other specified bacterial agents as the cause of diseases classified elsewhere: Secondary | ICD-10-CM | POA: Insufficient documentation

## 2012-08-16 DIAGNOSIS — O368190 Decreased fetal movements, unspecified trimester, not applicable or unspecified: Secondary | ICD-10-CM | POA: Insufficient documentation

## 2012-08-16 DIAGNOSIS — B3731 Acute candidiasis of vulva and vagina: Secondary | ICD-10-CM | POA: Insufficient documentation

## 2012-08-16 DIAGNOSIS — O99330 Smoking (tobacco) complicating pregnancy, unspecified trimester: Secondary | ICD-10-CM | POA: Insufficient documentation

## 2012-08-16 HISTORY — DX: Other allergy status, other than to drugs and biological substances: Z91.09

## 2012-08-16 HISTORY — DX: Unspecified ovarian cyst, unspecified side: N83.209

## 2012-08-16 MED ORDER — MICONAZOLE NITRATE 200 MG VA SUPP
200.00 mg | Freq: Every evening | VAGINAL | Status: DC
Start: 2012-08-16 — End: 2012-09-21

## 2012-08-16 NOTE — Progress Notes (Signed)
Pt down to ultrasound  Per wheelchair by transporter

## 2012-08-16 NOTE — Discharge Instructions (Signed)
[place] Head Injury w Definite Concussion    [place] Head Injury with Definite Concussion     You, your child, or your family member has been evaluated by our Emergency Department and has been diagnosed with a concussion. This is a form of traumatic brain injury that results from significant acceleration, deceleration, or rotational force to the brain and produces an alteration in the function of the brain. Most concussions are mild and symptoms usually resolve quickly. Contact of the head with another object, loss of consciousness, or loss of memory is not required for the diagnosis of concussion.      Common symptoms of a concussion include:   Chronic headaches   Dizziness, balance problems   Nausea   Vision problems   Increased sensitivity to noise and/or light   Depression or mood swings   Anxiety   Irritability   Memory problems   Difficulty concentrating or paying attention   Sleep difficulties   Feeling tired all the time    To diagnose a concussion, a thorough history and physical have been performed. At the discretion of the health care team, radiology studies may have been performed. A concussion is usually not seen on a CT scan or even an MRI. Even if you received a CT scan or MRI of the brain in the emergency department, it does not predict the presence or absence of a concussion and only demonstrates large injuries such as fractures and/or bleeding to the brain that could require surgery.      The symptoms of a concussion may have already occurred or may develop over the next few hours to days. Because it is difficult to predict the effect the concussion will have on a patient, you will likely require multiple reexaminations after today.    Very rarely, some patients with head injuries will develop more serious symptoms after discharge from the Emergency Department. You should contact your doctor or return to the Emergency Department immediately if you develop any of the  following:   Worsening, severe headache that does not improve with acetaminophen/ibuprofen   Fever, neck pain, persistent nausea/vomiting   Increased lethargy/difficulty waking from sleep   Change in behavior, increased confusion, and loss of interest in surroundings   Change in vision (blurred, double), pupils of different sizes   Drainage or bleeding from the ear or nose   Difficulty walking   Convulsions/Seizure-like activity    Follow up    It is very important that the patient follows up with a physician trained in the care of concussion. Your Emergency Department physician will provide you with referral information.    Return to Sports/Work/Activities    If you are involved with sports, your emergency physician will not clear you to return to play. You should not do activity which could potentially result in a fall or perform any exercise until cleared by a follow up physician, school trainer, or concussion clinic. You should pay close attention to your symptoms. Avoid any activities that cause recurrence or worsening of your symptoms, including reading and exertion. It is common to take 2-3 days off work or school to rest and minimize activity.    If you play school sports, you may not participate until you have completed your county's required Post Concussion Medical Clearance and Return to Play Evaluation administered through your High School Athletic Trainer, private physician, or a specialized concussion clinic as required by  State Law. This is a 6-step process and may be a different length   process for each person.    This includes:   Removal from play if concussion is suspected   No return to play that day   Written clearance from appropriate licensed medical provider to return to athletics   Compliance with your counties mandated Return to Play Protocol   Parent and athlete MUST be provided information on concussions annually WITH acknowledgement of "information understood"  PRIOR to participation    CT Scan    A CT scan of the brain is used to evaluate patients for large injuries such as fractures and/or bleeding to the brain that could require surgery. If you received a CT scan during your stay the results will be discussed with you by the physician.    If you did not receive a CT scan, some patients ask "why wasn't it done?"    There are two general reasons:    The first is that most patients who fall and hit their heads, who have a normal neurologic examination, and who don't have any high risk symptoms (bad headache, persistent vomiting, loss of consciousness), will not have anything found on a CT scan. And even fewer will have anything that requires treatment.     The second reason is that a CT scan of the head is not without risk. The medical profession is becoming more and more concerned about the risks of radiation from CT scans. This is particularly important for very young children with developing brains. The long-term risks may include a very small increase in the risk of cancer. These risks are not yet clear, but are important in the decision to get a CT scan.     So when you ask your doctor, "Why NOT get a CT scan just to make sure?", this is why. Because the risk of the CT scan may be greater than the chance of actually finding anything on the scan. You physician today has weighed the chances of anything serious going on with the risks of getting a CT scan and has decided that a CT scan is not in your best interests.    Cervical Strain    You have been diagnosed with a neck strain, also called a cervical strain.    The cervical spine is between the base of the skull and the top of the shoulders.    A strain happens when a muscle is stretched, torn or injured. The pain that you feel is caused by inflammation (swelling) or bruising in the muscle. A strain is not the same as a sprain. A sprain is an injury to a ligament that holds bones together.    A cervical  strain occurs when the head snaps forward during an accident or a fall. The muscles can easily be strained with this type of movement. It is normal to experience pain over the muscles around the neck but not over the bones of the cervical spine.    Your doctor did not find any pain over the bones in your neck (even though you might have pain in the neck muscles). This means it is very unlikely that you have a fracture in your neck. Your doctor did not think it was necessary to take an x-ray.    Apply a warm damp washcloth to the neck for 20 minutes at a time, at least 4 times per day. This will reduce your pain. Massaging your neck might also help.    It is normal to feel stiffness and pain in your neck after   a strain. This pain may last for the next few days. If your pain stays about the same or gets better, you probably do not need to see a doctor. However, if your symptoms get worse or you have new symptoms, you should return here or go to the nearest Emergency Department.    Call your physician or go to the nearest Emergency Department if you your pain does not improve within 4 weeks or your pain is bad enough to seriously limit your normal activities.    YOU SHOULD SEEK MEDICAL ATTENTION IMMEDIATELY, EITHER HERE OR AT THE NEAREST EMERGENCY DEPARTMENT, IF ANY OF THE FOLLOWING OCCURS:   Your arms and legs tingle or get numb (lose feeling).   Your arms or legs are weak.   You feel that your neck is unstable.   You lose control of your bladder or bowels. If this were to happen, it may cause you to wet or soil yourself. Some people may actually have problems urinating instead.   Your pain gets worse.
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
## 2012-08-16 NOTE — Progress Notes (Signed)
Report given to J. Lyman Bishop, Charity fundraiser.  Care relinquished at this time.

## 2012-08-17 NOTE — Progress Notes (Signed)
Order to remove fetal heart monitor. Continuous TOCO only per Dr. Mcneil Sober

## 2012-08-17 NOTE — Progress Notes (Signed)
Orders by Dr. Katrinka Blazing to transfer to ER. Discussed prescription for yeast infection and discussed patient making appointment with her OB/GYN Dr. Dorinda Hill about bacterial vaginosis due to her medication reactions. Discussed to return if she experiences any vaginal bleeding, contractions, leakage of fluid, or decreased fetal movement. Denies any other needs at this time.
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
## 2012-09-21 ENCOUNTER — Ambulatory Visit
Admission: RE | Admit: 2012-09-21 | Discharge: 2012-09-21 | Disposition: A | Discharge status: Home / Self Care | Payer: No Typology Code available for payment source | Attending: Obstetrics & Gynecology | Admitting: Obstetrics & Gynecology

## 2012-09-21 ENCOUNTER — Ambulatory Visit: Payer: No Typology Code available for payment source | Admitting: Obstetrics & Gynecology

## 2012-09-21 ENCOUNTER — Ambulatory Visit: Payer: No Typology Code available for payment source

## 2012-09-21 DIAGNOSIS — K429 Umbilical hernia without obstruction or gangrene: Secondary | ICD-10-CM | POA: Insufficient documentation

## 2012-09-21 DIAGNOSIS — R109 Unspecified abdominal pain: Secondary | ICD-10-CM | POA: Insufficient documentation

## 2012-09-21 DIAGNOSIS — O9989 Other specified diseases and conditions complicating pregnancy, childbirth and the puerperium: Secondary | ICD-10-CM | POA: Insufficient documentation

## 2012-09-21 DIAGNOSIS — O99330 Smoking (tobacco) complicating pregnancy, unspecified trimester: Secondary | ICD-10-CM | POA: Insufficient documentation

## 2012-09-21 LAB — CBC AND DIFFERENTIAL
Basophils Absolute Automated: 0.01 (ref 0.00–0.20)
Basophils Automated: 0 %
Eosinophils Absolute Automated: 0.03 (ref 0.00–0.70)
Eosinophils Automated: 0 %
Hematocrit: 31.9 % — ABNORMAL LOW (ref 37.0–47.0)
Hgb: 10.8 g/dL — ABNORMAL LOW (ref 12.0–16.0)
Immature Granulocytes Absolute: 0.04
Immature Granulocytes: 0 %
Lymphocytes Absolute Automated: 1.91 (ref 0.50–4.40)
Lymphocytes Automated: 20 %
MCH: 27 pg — ABNORMAL LOW (ref 28.0–32.0)
MCHC: 33.9 g/dL (ref 32.0–36.0)
MCV: 79.8 fL — ABNORMAL LOW (ref 80.0–100.0)
MPV: 9.3 fL — ABNORMAL LOW (ref 9.4–12.3)
Monocytes Absolute Automated: 0.51 (ref 0.00–1.20)
Monocytes: 6 %
Neutrophils Absolute: 6.87 (ref 1.80–8.10)
Neutrophils: 74 %
Platelets: 217 (ref 140–400)
RBC: 4 — ABNORMAL LOW (ref 4.20–5.40)
RDW: 14 % (ref 12–15)
WBC: 9.33 (ref 3.50–10.80)

## 2012-09-21 LAB — COMPREHENSIVE METABOLIC PANEL
ALT: 10 U/L (ref 0–55)
AST (SGOT): 11 U/L (ref 5–34)
Albumin/Globulin Ratio: 1.1 (ref 0.9–2.2)
Albumin: 2.9 g/dL — ABNORMAL LOW (ref 3.5–5.0)
Alkaline Phosphatase: 64 U/L (ref 40–150)
Anion Gap: 7 (ref 5.0–15.0)
BUN: 9 mg/dL (ref 7.0–19.0)
Bilirubin, Total: 0.2 mg/dL (ref 0.2–1.2)
CO2: 22 (ref 22–29)
Calcium: 9 mg/dL (ref 8.5–10.5)
Chloride: 108 — ABNORMAL HIGH (ref 98–107)
Creatinine: 0.7 mg/dL (ref 0.6–1.0)
Globulin: 2.7 g/dL (ref 2.0–3.6)
Glucose: 81 mg/dL (ref 70–100)
Potassium: 3.8 (ref 3.5–5.1)
Protein, Total: 5.6 g/dL — ABNORMAL LOW (ref 6.0–8.3)
Sodium: 137 (ref 136–145)

## 2012-09-21 LAB — GFR: EGFR: >60

## 2012-09-21 LAB — URINALYSIS WITH MICROSCOPIC
Blood, UA: NEGATIVE
Glucose, UA: NEGATIVE
Ketones UA: 15 — AB
Nitrite, UA: NEGATIVE
Specific Gravity UA: 1.029 (ref 1.001–1.035)
Urine pH: 6 (ref 5.0–8.0)
Urobilinogen, UA: 0.2 mg/dL (ref 0.2–2.0)

## 2012-09-21 LAB — LIPASE: Lipase: 36 U/L (ref 8–78)

## 2012-09-21 MED ORDER — LACTATED RINGERS IV BOLUS
1000.0000 mL | Freq: Once | INTRAVENOUS | Status: AC
Start: 2012-09-21 — End: 2012-09-21
  Administered 2012-09-21: 1000 mL via INTRAVENOUS

## 2012-09-21 MED ORDER — ACETAMINOPHEN 500 MG PO TABS
1000.00 mg | ORAL_TABLET | Freq: Four times a day (QID) | ORAL | Status: DC | PRN
Start: 2012-09-21 — End: 2012-12-05

## 2012-09-21 NOTE — Progress Notes (Signed)
Pt taken off efm to u/s

## 2012-09-21 NOTE — Discharge Instructions (Signed)
[facility]  Obstetrical Patient Discharge Instructions for Undelivered Patients    *Should the following symptoms occur, Call your doctor's office*  Do NOT wait for your next visit:    Call your doctor if:   Contractions become stronger and regular every 5-10 minutes for one hour.   Bag of water ruptures.  It may be a sudden gush or continuous leak that you cannot control.   Vaginal bleeding occurs   Bright red, running down legs or pass blood clots   Spotting after a vaginal exam or mucous blood show is normal.   You experience any unusually sharp abdominal pain.   You notice a decrease in fetal movement.   You have persistent severe headaches.   You have blurring of vision or spots before the eyes.   You have nausea and vomiting.   You have sudden increased swelling of hands, feet, face, and ankles.   If suddenly your rings are too tight.   You have chills and fever over 100.4 degrees farenheit.   Temperature not accompanied by a cold.   You have very frequent urination or burning with urination.   You have further concerns or questions.

## 2012-09-21 NOTE — Progress Notes (Signed)
Dr Mcneil Sober in room examining pt

## 2012-09-21 NOTE — Progress Notes (Signed)
Pt placed on efm. Pt here c/o belly button and menstrual cramping every 5 minutes. Pt states pain has been going on since 1700 today. Pt states +fm. Pt denies srom.  Pt states light pink on toilet paper at 1700 when she wiped. Pt states had intercourse today.
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
## 2012-09-21 NOTE — Progress Notes (Signed)
Pt okay to be d/c home per dr kula. Pt given d/c instructions, pt verbalizes understanding. D/c home.

## 2012-10-21 ENCOUNTER — Ambulatory Visit: Payer: Self-pay | Admitting: Obstetrics & Gynecology

## 2012-10-21 ENCOUNTER — Ambulatory Visit: Payer: No Typology Code available for payment source

## 2012-10-21 ENCOUNTER — Ambulatory Visit
Admission: EM | Admit: 2012-10-21 | Discharge: 2012-10-21 | Disposition: A | Discharge status: Home / Self Care | Payer: No Typology Code available for payment source | Attending: Emergency Medicine | Admitting: Emergency Medicine

## 2012-10-21 DIAGNOSIS — J019 Acute sinusitis, unspecified: Secondary | ICD-10-CM | POA: Insufficient documentation

## 2012-10-21 DIAGNOSIS — Z3490 Encounter for supervision of normal pregnancy, unspecified, unspecified trimester: Secondary | ICD-10-CM

## 2012-10-21 DIAGNOSIS — J40 Bronchitis, not specified as acute or chronic: Secondary | ICD-10-CM | POA: Insufficient documentation

## 2012-10-21 DIAGNOSIS — O9989 Other specified diseases and conditions complicating pregnancy, childbirth and the puerperium: Secondary | ICD-10-CM | POA: Insufficient documentation

## 2012-10-21 LAB — GROUP A STREP, RAPID ANTIGEN: Group A Strep, Rapid Antigen: NEGATIVE

## 2012-10-21 MED ORDER — AZITHROMYCIN 250 MG PO TABS
250.0000 mg | ORAL_TABLET | Freq: Every day | ORAL | Status: AC
Start: 2012-10-21 — End: 2012-10-27

## 2012-10-21 MED ORDER — ALBUTEROL SULFATE (2.5 MG/3ML) 0.083% IN NEBU
2.5000 mg | INHALATION_SOLUTION | Freq: Once | RESPIRATORY_TRACT | Status: AC
Start: 2012-10-21 — End: 2012-10-21
  Administered 2012-10-21: 2.5 mg via RESPIRATORY_TRACT
  Filled 2012-10-21: qty 3

## 2012-10-21 MED ORDER — PREDNISONE 20 MG PO TABS
20.0000 mg | ORAL_TABLET | Freq: Every day | ORAL | Status: AC
Start: 2012-10-22 — End: 2012-10-26

## 2012-10-21 MED ORDER — PREDNISONE 20 MG PO TABS
20.0000 mg | ORAL_TABLET | Freq: Once | ORAL | Status: AC
Start: 2012-10-21 — End: 2012-10-21
  Administered 2012-10-21: 20 mg via ORAL
  Filled 2012-10-21: qty 1

## 2012-10-21 MED ORDER — ALBUTEROL SULFATE HFA 108 (90 BASE) MCG/ACT IN AERS
2.0000 | INHALATION_SPRAY | Freq: Once | RESPIRATORY_TRACT | Status: AC
Start: 2012-10-21 — End: 2012-10-21
  Administered 2012-10-21: 2 via RESPIRATORY_TRACT
  Filled 2012-10-21: qty 1

## 2012-10-21 NOTE — Progress Notes (Signed)
Pt of Dr. Gerarda Fraction here in triage for evaluation of c/o SOB and coughing for the past week. Pt states she was seen by Dr. Dorinda Hill and given a Z-Pack script but pt states she did not take the prescription. Pt states baby is moving well. Denies LOF, vaginal bleeding or regular contractions. Pt placed on monitor.

## 2012-10-21 NOTE — Progress Notes (Signed)
Labor and Delivery Triage Non Stress Test Report    Subjective:  Catherine Spence is a 22 y.o. G75P1011 female with a gestation age of [redacted]w[redacted]d and Estimated Date of Delivery: 12/24/12 who presents for SOB and cough.  Patient receives [facility] with Dr. Dorinda Hill.  Patient was recently diagnosed with sinusitis and was prescribed a Z-pack but did not get Rx because she did not think she had sinusitis. No obstetrical complaints.    Objective:  Temp:  [97.8 F (36.6 C)] 97.8 F (36.6 C)  Heart Rate:  [101-109] 101   BP: (127)/(71) 127/71 mmHg  Pox 98% on RA  Chest CTAB  FHT Baseline caetgory 1          Variability moderate in degree          Accels present          Decels None    Toco: none    Plan:  IUP@ [redacted]w[redacted]d  SOB, cough-send to ER for evaluation  Normal Pox  Follow up with primary OB    Gerarda Fraction, MD  10/21/2012  11:54 AM  [phone]

## 2012-10-21 NOTE — ED Provider Notes (Signed)
Physician/Midlevel provider first contact with patient: 10/21/12 1217         History     Chief Complaint   Patient presents with   . Cough     HPI Comments: Sent from [facility] for further eval and tx.    Patient is a 22 y.o. female presenting with cough. The history is provided by the patient.   Cough  This is a new problem. Episode onset: 4 days. The problem occurs constantly. The problem has not changed since onset.The cough is non-productive. There has been no fever. Associated symptoms include chest pain, chills, rhinorrhea, sore throat, shortness of breath and wheezing. Pertinent negatives include no sweats, no weight loss, no ear pain and no headaches. She has tried nothing for the symptoms. She is a smoker.       Past Medical History   Diagnosis Date   . Childhood asthma    . Anxiety    . Depression    . Pollen allergies    . Other and unspecified ovarian cysts      left side   . HPV in female 2013     had cervical biopsies       History reviewed. No pertinent past surgical history.    No family history on file.    Social  History   Substance Use Topics   . Smoking status: Current Some Day Smoker     Types: Cigarettes   . Smokeless tobacco: Not on file      Comment: trying to quit, has been smoking since age 10 up to 1 ppd   . Alcohol Use: No       .     Allergies   Allergen Reactions   . Amoxicillin Anaphylaxis   . Ceclor (Cefaclor) Anaphylaxis and Rash   . Macrodantin Anaphylaxis   . Penicillins Anaphylaxis   . Flagyl (Metronidazole) Itching   . Sulfa Antibiotics Itching   . Clindamycin Rash   . Latex Rash       Current/Home Medications    ACETAMINOPHEN (TYLENOL) 500 MG TABLET    Take 2 tablets (1,000 mg total) by mouth every 6 (six) hours as needed for Pain.    PRENATAL VIT-FE SULFATE-FA (PRENATAL VITAMIN PO)    Take 1 tablet by mouth daily.        Review of Systems   Constitutional: Positive for chills. Negative for fever, weight loss and diaphoresis.   HENT: Positive for sore throat, rhinorrhea and sinus  pressure. Negative for ear pain, congestion and neck pain.    Eyes: Negative for pain and visual disturbance.   Respiratory: Positive for cough, shortness of breath and wheezing. Negative for chest tightness.    Cardiovascular: Positive for chest pain. Negative for palpitations and leg swelling.   Gastrointestinal: Negative for nausea, vomiting, abdominal pain, diarrhea and constipation.   Genitourinary: Negative for dysuria and urgency.   Musculoskeletal: Negative for back pain and gait problem.   Skin: Negative for color change and rash.   Neurological: Negative for dizziness, weakness and headaches.   Psychiatric/Behavioral: Negative for confusion. The patient is not nervous/anxious.        Physical Exam    BP 104/58  Pulse 100  Temp 97.4 F (36.3 C)  Resp 20  SpO2 95%  LMP 03/19/2012    Physical Exam   Nursing note and vitals reviewed.  Constitutional: She is oriented to person, place, and time. She appears well-developed and well-nourished.   HENT:   Head:  Normocephalic and atraumatic.   Right Ear: External ear normal.   Left Ear: External ear normal.   Nose: Right sinus exhibits maxillary sinus tenderness. Left sinus exhibits maxillary sinus tenderness.   Mouth/Throat: Oropharynx is clear and moist. No oropharyngeal exudate.   Eyes: Conjunctivae normal are normal. Pupils are equal, round, and reactive to light. Right eye exhibits no discharge. Left eye exhibits no discharge. No scleral icterus.   Neck: Normal range of motion. Neck supple. No JVD present. No tracheal deviation present.   Cardiovascular: Normal rate, regular rhythm and normal heart sounds.    No murmur heard.  Pulmonary/Chest: Effort normal. She has wheezes in the left middle field and the left lower field. She exhibits no tenderness.   Abdominal: Soft. Bowel sounds are normal. She exhibits no distension. There is no tenderness. There is no guarding.   Musculoskeletal: She exhibits no edema and no tenderness.   Neurological: She is alert  and oriented to person, place, and time. Coordination normal.   Skin: Skin is warm and dry. She is not diaphoretic. No pallor.   Psychiatric: She has a normal mood and affect. Her behavior is normal. Judgment normal.       MDM and ED Course     ED Medication Orders      Start     Status Ordering Provider    10/21/12 1403   albuterol (PROVENTIL HFA;VENTOLIN HFA) inhaler 2 puff   RT - Once      Route: Inhalation  Ordered Dose: 2 puff         Last MAR action:  Given Aalyah Mansouri    10/21/12 1402   predniSONE (DELTASONE) tablet 20 mg   Once      Route: Oral  Ordered Dose: 20 mg         Last MAR action:  Given Collins Dimaria    10/21/12 1226   albuterol (PROVENTIL) nebulizer solution 2.5 mg   RT - Once      Route: Nebulization  Ordered Dose: 2.5 mg         Last MAR action:  Given Katrianna Friesenhahn                 MDM  Number of Diagnoses or Management Options  Acute sinusitis:   Bronchitis:   Pregnancy:   Diagnosis management comments: Oxygen saturation by pulse oximetry is 95%-100%, Normal.  Interventions: None Needed.    Diff Dx: pneumonia, asthma, bronchitis, sinusitis. Doubt cardiac or PE    Reassessment of JOHN VASCONCELOS feeling better. Symptoms are improved. Stable for discharge.    I have reviewed the nursing history.    I have reviewed all available xrays and find the following results: CXR - NAP    DR. Melvern Sample  is the primary attending for this patient and has obtained and performed the history, PE, and medical decision making for this patient.      Results     Procedure Component Value Units Date/Time    Rapid Strep ([id number]) Collected:10/21/12 1239    Specimen Information:Throat Updated:10/21/12 1256     Group A Strep, Rapid Antigen Negative         Radiology Results (24 Hour)     Procedure Component Value Units Date/Time    XR Chest 2 Views ([id number]) Collected:10/21/12 1349    Order Status:Completed  Updated:10/21/12 1354    Narrative:    Clinical History: Cough and wheezing. Rule out pneumonia.    Findings:  Compared with prior  studies including 11/30/2011. PA and  lateral views of the chest. Central silhouette and pulmonary vascularity  are normal. Lungs are hypoinflated. No focal consolidation or pleural  effusion. Mild degenerative changes in the spine.     Impression:    Impression: No evidence of acute cardiopulmonary disease.    Darra Lis, MD   10/21/2012 1:50 PM               Amount and/or Complexity of Data Reviewed  Tests in the radiology section of CPT: ordered and reviewed          Procedures    Clinical Impression & Disposition     Clinical Impression  Final diagnoses:   Acute sinusitis   Bronchitis   Pregnancy        ED Disposition     Discharge THECLA FORGIONE discharge to home/self care.    Condition at discharge: Good             New Prescriptions    AZITHROMYCIN (ZITHROMAX Z-PAK) 250 MG TABLET    Take 1 tablet (250 mg total) by mouth daily. 2 tablets on day one, then 1 tablet on days 2 through 5    PREDNISONE (DELTASONE) 20 MG TABLET    Take 1 tablet (20 mg total) by mouth daily.               Melvern Sample, DO  10/21/12 1610

## 2012-10-21 NOTE — ED Notes (Addendum)
[redacted] weeks pregnant sent over after checked out at [facility] for cough, congestion  x 4 days; choking on phlegm;states coughing spells causes contractions.

## 2012-10-21 NOTE — Progress Notes (Signed)
Dr. Wilkie Aye notified of pt's arrival and complaints. VS and O2 sat reported to dr. Wilkie Aye. Ordered to do nst on pt then send pt to the ER for further evaluation.

## 2012-10-21 NOTE — Discharge Instructions (Signed)
Bronchitis    You have been diagnosed with bronchitis.    Bronchitis is an irritation of the large breathing tubes. It can be caused by tobacco smoke, air pollution, or an infection. Patients with bronchitis are short of breath and may cough up green or yellow mucous. These symptoms are usually worse at night, when lying flat and in wet weather. Most people with bronchitis do not need antibiotics. If your doctor prescribes antibiotics, fill the prescription and take all the medicine according to the instructions.    Bronchitis is usually treated with medicine to help stop coughing. An inhaler with Albuterol or Ventolin is sometimes used to help with cough. It is best to use the inhaler with a spacer to help the medicine reach your lungs. The doctor can prescribe a spacer.    Bronchitis is usually caused by a virus. Antibiotics do not kill viruses. In fact, antibiotics do not affect viruses in any way. In the past, some doctors prescribed antibiotics for people with bronchitis. We now know that antibiotics are not helpful for most bronchitis patients. Patients who might need antibiotics are those with lung problems that don't go away, like emphysema or COPD.    Your coughing and wheezing might last for 2 or 3 weeks! The symptoms should get better over this time period and not worse.    Your doctor prescribed an Albuterol inhaler. Use it every four hours if you are wheezing, coughing, or short of breath. This will reduce symptoms.    Do not smoke. Research shows that smoking causes heart disease, cancer, and birth defects. Avoiding smoking will help your asthma. If you smoke, ask your doctor for ideas about how to stop.   If you do not smoke, avoid others who do.    YOU SHOULD SEEK MEDICAL ATTENTION IMMEDIATELY, EITHER HERE OR AT THE NEAREST EMERGENCY DEPARTMENT, IF ANY OF THE FOLLOWING OCCURS:   You wheeze or have trouble breathing.   You have a fever (temperature higher than 101 F or 38.3 C), that won't  go away.   You have chest pain.   You vomit or cannot keep liquids down or you feel weak or dizzy.   Your symptoms get worse over the next 2 or 3 days.      Sinusitis    You have been seen for a sinus infection.    Sinus infections are common. They often happen after people have a virus or a common cold.    Symptoms are: Pain in the face, green or yellow mucous from the nose, fevers and chills. There may also be a feeling of fullness or pressure in the face.    Decongestants may help. This helps the sinuses drain. Sometimes a steroid spray is used.    You may need antibiotics for the infection. Not all cases of sinusitis need antibiotics. Viruses cause most sinusitis. Antibiotics do not work on viruses. Sometimes sinusitis and be caused by bacteria. These cases usually get better with medicine to help the sinuses drain. Antibiotics should be given only to patients who have symptoms for more than 2 weeks and if they have fevers, severe sinus pain and pus mixed in with the mucus.    YOU SHOULD SEEK MEDICAL ATTENTION IMMEDIATELY, EITHER HERE OR AT THE NEAREST EMERGENCY DEPARTMENT, IF ANY OF THE FOLLOWING OCCURS:   You do not get better with treatment.   You have severe headaches and high fevers or any confusion.   You have worse pain in the face.   Your face gets more swollen.

## 2012-11-23 LAB — GROUP B STREP TRANSCRIBED: GBS Transcribed: NEGATIVE

## 2012-12-05 ENCOUNTER — Ambulatory Visit
Admission: RE | Admit: 2012-12-05 | Discharge: 2012-12-05 | Disposition: A | Discharge status: Home / Self Care | Payer: No Typology Code available for payment source | Source: Ambulatory Visit | Attending: Obstetrics & Gynecology | Admitting: Obstetrics & Gynecology

## 2012-12-05 ENCOUNTER — Ambulatory Visit: Payer: No Typology Code available for payment source

## 2012-12-05 DIAGNOSIS — R109 Unspecified abdominal pain: Secondary | ICD-10-CM | POA: Insufficient documentation

## 2012-12-05 DIAGNOSIS — O9989 Other specified diseases and conditions complicating pregnancy, childbirth and the puerperium: Secondary | ICD-10-CM | POA: Insufficient documentation

## 2012-12-05 DIAGNOSIS — Z3A37 37 weeks gestation of pregnancy: Secondary | ICD-10-CM

## 2012-12-05 HISTORY — DX: Postpartum depression: F53.0

## 2012-12-05 NOTE — Progress Notes (Signed)
Heights instructions given to pt. Pt verbalizes understanding. Pt home in stable condition, care of pt relinquished.

## 2012-12-05 NOTE — Progress Notes (Signed)
Pt not having contractions or cramping. Orders received to [place] pt home and to have pt follow up in MD's office at her next MD's appt.

## 2012-12-05 NOTE — Discharge Instructions (Signed)
Call your doctor if:  -Contractions become stronger and regular every 5-10 minutes for one hour  -Bag of water ruptures. (Sudden gush or continuous leak).  -Vaginal bleeding occurs (bright red, running down legs or pass blood clots). Spotting after a vaginal exam or mucous blood show is normal.  -You experience any unusually sharp abdominal pain.  -You notice a decrease in fetal movement.  -You have persistent severe headaches.  -You have blurring of vision or spots before the eyes.  -You have nausea and vomiting.  -You have sudden increased swelling of hands, feet, face, and ankles. (if suddenly your rings are too tight)  -You have chills and fever over 100.4 F. (temperature not accompanied by a cold)  -You have very frequent urination or burning with urination.  -You have further concerns or questions.Call your doctor if:  -Contractions become stronger and regular every 5-10 minutes for one hour  -Bag of water ruptures. (Sudden gush or continuous leak).  -Vaginal bleeding occurs (bright red, running down legs or pass blood clots). Spotting after a vaginal exam or mucous blood show is normal.  -You experience any unusually sharp abdominal pain.  -You notice a decrease in fetal movement.  -You have persistent severe headaches.  -You have blurring of vision or spots before the eyes.  -You have nausea and vomiting.  -You have sudden increased swelling of hands, feet, face, and ankles. (if suddenly your rings are too tight)  -You have chills and fever over 100.4 F. (temperature not accompanied by a cold)  -You have very frequent urination or burning with urination.  -You have further concerns or questions.

## 2012-12-05 NOTE — Progress Notes (Signed)
Pt of Dr.Donalds arrived in triage with c/o sharp vaginal pain and mild menstrual cramps. EFM x 2 explained and applied, hx obtained, assessment complete.

## 2012-12-06 ENCOUNTER — Ambulatory Visit
Admission: RE | Admit: 2012-12-06 | Discharge: 2012-12-06 | Disposition: A | Discharge status: Home / Self Care | Payer: No Typology Code available for payment source | Attending: Obstetrics | Admitting: Obstetrics

## 2012-12-06 ENCOUNTER — Emergency Department
Admission: EM | Admit: 2012-12-06 | Discharge: 2012-12-07 | Disposition: A | Discharge status: Home / Self Care | Payer: No Typology Code available for payment source | Attending: Emergency Medicine | Admitting: Emergency Medicine

## 2012-12-06 ENCOUNTER — Emergency Department: Payer: No Typology Code available for payment source

## 2012-12-06 DIAGNOSIS — D649 Anemia, unspecified: Secondary | ICD-10-CM | POA: Insufficient documentation

## 2012-12-06 DIAGNOSIS — D72829 Elevated white blood cell count, unspecified: Secondary | ICD-10-CM | POA: Insufficient documentation

## 2012-12-06 DIAGNOSIS — O99891 Other specified diseases and conditions complicating pregnancy: Secondary | ICD-10-CM | POA: Insufficient documentation

## 2012-12-06 DIAGNOSIS — F411 Generalized anxiety disorder: Secondary | ICD-10-CM | POA: Insufficient documentation

## 2012-12-06 DIAGNOSIS — O99330 Smoking (tobacco) complicating pregnancy, unspecified trimester: Secondary | ICD-10-CM | POA: Insufficient documentation

## 2012-12-06 DIAGNOSIS — R0602 Shortness of breath: Secondary | ICD-10-CM | POA: Insufficient documentation

## 2012-12-06 DIAGNOSIS — R209 Unspecified disturbances of skin sensation: Secondary | ICD-10-CM | POA: Insufficient documentation

## 2012-12-06 DIAGNOSIS — R079 Chest pain, unspecified: Secondary | ICD-10-CM | POA: Insufficient documentation

## 2012-12-06 DIAGNOSIS — O99019 Anemia complicating pregnancy, unspecified trimester: Secondary | ICD-10-CM | POA: Insufficient documentation

## 2012-12-06 MED ORDER — SODIUM CHLORIDE 0.9 % IV BOLUS
500.0000 mL | Freq: Once | INTRAVENOUS | Status: AC
Start: 2012-12-06 — End: 2012-12-07
  Administered 2012-12-07: 500 mL via INTRAVENOUS

## 2012-12-06 NOTE — Discharge Instructions (Signed)
Call your doctor if:    - Contractions become stronger and regular every 5-10 minutes for one hour or   - Bag of water ruptures (sudden gush or continuous leak).  - Vaginal bleeding occurs (bright red, running down legs or pass blood clots).  Spotting   after a vaginal exam or mucous blood show is normal.  - You experience any unusually sharp abdominal pain.  - You notice a decrease in fetal movement.  - You have a persistent severe headaches.  - You have blurring of vision or spots before the eyes.  - You have nausea and vomiting.  - You have sudden increased swelling of hands, feet, face, and ankles.   - if suddenly your rings are too tight.  - You have chills and fever over 100.36F.   - temperature not accompanied by a cold.  - You have very frequent urination or burning with urination.  - You have further concerns or questions.    BP 107/55  Pulse 91  Temp 97.8 F (36.6 C) (Oral)  SpO2 97%  LMP 03/19/2012

## 2012-12-06 NOTE — ED Notes (Signed)
Patient states she was trying to fall asleep tonight and all of the sudden felt like she couldn't breathe. Patient states she has pressure under her right and left breasts radiating to her back. Patient states pressure increases with inspiration. Patient complains of sob. Patient states her mouth and throat become dry when she breathes. Patient cleared by [facility] prior to coming to ED.

## 2012-12-06 NOTE — Progress Notes (Signed)
New pt admitted to unit complaining of shortness of breath that started 40 minutes ago , pt states that she went to the ER and they sent her here , Pt also complaining of cramping and back pain

## 2012-12-06 NOTE — Final Progress Note (DC Note for stay less than 48 (Signed)
IUP @ 37 weeks and 3days. [facility] 12/24/12 Presents with SOB worse when lying down and feels pressure in chest, G23P1 History + smoker since 22 years old. Still smokes with pregnancy. PMD is Dr Dorinda Hill    Vital signs WNL.  Patient Vitals for the past 12 hrs:   BP Temp Pulse   12/06/12 2243 - - 91    12/06/12 2240 - - 94    12/06/12 2233 - 97.8 F (36.6 C) -   12/06/12 2230 107/55 mmHg - 90    FHR category 1.   Physical Exam:   Lungs clear  CVS:RRR   Significant findings: Gravid abdomen with EFW 6+lbs. No contractions noted on monitor   Pelvic exam deferred  A/P: Cleared obstetrically from L&D.          States Dr Dorinda Hill will induce her  12/17/12         Transfer to ER for evaluation of SOB and chest pressure.

## 2012-12-07 LAB — CBC AND DIFFERENTIAL
Basophils Absolute Automated: 0.01 (ref 0.00–0.20)
Basophils Automated: 0 %
Eosinophils Absolute Automated: 0.17 (ref 0.00–0.70)
Eosinophils Automated: 1 %
Hematocrit: 31.8 % — ABNORMAL LOW (ref 37.0–47.0)
Hgb: 10.4 g/dL — ABNORMAL LOW (ref 12.0–16.0)
Immature Granulocytes Absolute: 0.09 — ABNORMAL HIGH
Immature Granulocytes: 1 %
Lymphocytes Absolute Automated: 3.15 (ref 0.50–4.40)
Lymphocytes Automated: 27 %
MCH: 25.7 pg — ABNORMAL LOW (ref 28.0–32.0)
MCHC: 32.7 g/dL (ref 32.0–36.0)
MCV: 78.5 fL — ABNORMAL LOW (ref 80.0–100.0)
MPV: 9.4 fL (ref 9.4–12.3)
Monocytes Absolute Automated: 0.89 (ref 0.00–1.20)
Monocytes: 8 %
Neutrophils Absolute: 7.6 (ref 1.80–8.10)
Neutrophils: 64 %
Platelets: 227 (ref 140–400)
RBC: 4.05 — ABNORMAL LOW (ref 4.20–5.40)
RDW: 14 % (ref 12–15)
WBC: 11.82 — ABNORMAL HIGH (ref 3.50–10.80)

## 2012-12-07 LAB — COMPREHENSIVE METABOLIC PANEL
ALT: 7 U/L (ref 0–55)
AST (SGOT): 9 U/L (ref 5–34)
Albumin/Globulin Ratio: 0.9 (ref 0.9–2.2)
Albumin: 2.7 g/dL — ABNORMAL LOW (ref 3.5–5.0)
Alkaline Phosphatase: 103 U/L (ref 40–150)
Anion Gap: 9 (ref 5.0–15.0)
BUN: 7.6 mg/dL (ref 7.0–19.0)
Bilirubin, Total: 0.2 mg/dL (ref 0.2–1.2)
CO2: 20 — ABNORMAL LOW (ref 22–29)
Calcium: 8.7 mg/dL (ref 8.5–10.5)
Chloride: 110 — ABNORMAL HIGH (ref 98–107)
Creatinine: 0.6 mg/dL (ref 0.6–1.0)
Globulin: 3 g/dL (ref 2.0–3.6)
Glucose: 98 mg/dL (ref 70–100)
Potassium: 3.9 (ref 3.5–5.1)
Protein, Total: 5.7 g/dL — ABNORMAL LOW (ref 6.0–8.3)
Sodium: 139 (ref 136–145)

## 2012-12-07 LAB — ECG 12-LEAD
Atrial Rate: 90 {beats}/min
P Axis: 30 degrees
P-R Interval: 154 ms
Q-T Interval: 388 ms
QRS Duration: 90 ms
QTC Calculation (Bezet): 474 ms
R Axis: 4 degrees
T Axis: 19 degrees
Ventricular Rate: 90 {beats}/min

## 2012-12-07 LAB — TROPONIN I: Troponin I: <0.01 ng/mL (ref 0.00–0.09)

## 2012-12-07 LAB — GFR: EGFR: >60

## 2012-12-07 LAB — CK: Creatine Kinase (CK): 33 U/L (ref 29–168)

## 2012-12-07 NOTE — Discharge Instructions (Signed)
Anxiety, Panic    You have been diagnosed with an anxiety attack.    You seem to have had an anxiety attack. There are many conditions that can cause symptoms like these. If this is the first time this has happened, have a follow-up with your regular doctor. You may need more testing to be sure there isn't another cause for your symptoms.    Anxiety causes very strong feelings of worry and fear. It may also cause chest pain or shortness of breath. You may feel like you have palpitations (a racing heart). You might feel numbness (like parts of your body are "asleep"), especially around the mouth and in the hands or feet.    Follow up with your counselor and family doctor. If you do not have an appointment in the next 2-3 days, call and make one. It is VERY IMPORTANT for your counselor and family doctor to know if you get worse.    YOU SHOULD SEEK MEDICAL ATTENTION IMMEDIATELY, EITHER HERE OR AT THE NEAREST EMERGENCY DEPARTMENT, IF ANY OF THE FOLLOWING OCCURS:   You have symptoms that you normally don't have with your anxiety attacks.   You think of harming yourself (suicidal thoughts) or harming someone else.   You have symptoms you normally don't have and they last longer than normal or your medicine doesn t help. These include chest pain, passing out, feeling that your heart is racing or shortness of breath.   You have a fever.      Pregnancy    It has been determined that you are pregnant.    You originally came here to be treated for another problem, but pregnancy will affect the way we evaluate and treat your medical condition.    You have been referred to an obstetrician [facility] doctor). Make sure you call to make your appointment today. Close follow-up with an OB doctor is very important for you and your baby.    Make sure you obtain follow-up care as soon as possible, certainly within the next few days. Inform your doctor or obstetrician of your visit here and your pregnancy.    If you smoke, stop.  This is necessary to protect the health of your fetus (developing baby) and yourself. Smoking makes you more likely to have a miscarriage (lose your pregnancy).    Abusing alcohol or drugs will hurt your fetus (developing baby). It may cause a miscarriage, abnormal development, premature birth, or severe health or mental problems after birth. Avoid these substances from now until the end of your pregnancy.    If you are taking pain medications, antipsychotics, antidepressants, or seizure medication, check with your physician as soon as possible to see if you should continue with these medications.    If you develop pain or cramping in the abdomen (belly) or pelvis, vaginal bleeding, nausea/vomiting, dizziness/lightheadedness or passing out, then you should return here or go to the nearest Emergency Department immediately.    Keep yourself well-hydrated at all times by drinking a lot of fluid, and eat a balanced, healthy, nutritious diet.    You should take prenatal vitamins every day. These may be prescribed for you on this visit, or you may need to get them through your follow-up care provider. You can also purchase an over-the-counter equivalent that may be a lot less expensive. Ask you pharmacist for details.      Shortness of Breath, Unclear Etiology    You have been seen today for shortness of breath, or difficulty  breathing; but we don't know the cause.    This means that after talking about your medical problems, doing a physical exam, and looking at the tests that were done, there is still not a clear explanation as to why you are short of breath. You may need to go see your doctor for another exam or more tests to find out why you are short of breath. Marland Kitchen    CAUTION: Even after a workup, including EKGs, lab tests, x-rays and even CAT Scans (CT scans), it is still possible to have a serious problem that cannot be detected at first.    The physician caring for you feels that the cause of your shortness  of breath is not from a life-threatening problem.    Some of the more dangerous medical problems such as a heart attack, blood clot in the lung (pulmonary embolism), asthma, collapsed lung, heart failure, etc. have been looked at, but are not felt to be the cause of your shortness of breath.    Shortness of breath is a serious symptom and must be handled carefully. It is VERY IMPORTANT that you see your regular doctor and return for re-evaluation or seek medical attention immediately if your symptoms become worse or change.    YOU SHOULD SEEK MEDICAL ATTENTION IMMEDIATELY, EITHER HERE OR AT THE NEAREST EMERGENCY DEPARTMENT, IF ANY OF THE FOLLOWING OCCURS:   Your shortness of breath returns.   You notice that your shortness of breath happens as you walk, go up stairs, or exert yourself.   If you develop chest pain, sweating, or nausea (feel sick to your stomach).   Rapid heart beat.   You have any weakness, lightheadedness or you get so short of breath that you pass out.   It hurts to breathe.   Your leg swells.   Any other worsening symptoms or problems.   If you are unable to sleep because of shortness of breath, or you wake in the middle of the night short of breath.

## 2012-12-07 NOTE — ED Provider Notes (Signed)
Physician/Midlevel provider first contact with patient: 12/06/12 2346         History     Chief Complaint   Patient presents with   . Chest Pain   . Shortness of Breath     HPI Comments: 9 month preg pt with lower cp/sob with rads around to back and anxiety with numbness of forehead and all 4 ext while trying to sleep at 2100- cleared by tbi- now better    Patient is a 22 y.o. female presenting with chest pain and shortness of breath. The history is provided by the patient.   Chest Pain  The chest pain began 1 - 2 hours ago (2 hours ago). Duration of episode(s) is 2 hours. Chest pain occurs constantly. The chest pain is resolved. The severity of the pain is mild. The quality of the pain is described as aching. Radiates to: lower chest pain with rads around to back. Primary symptoms include shortness of breath. Pertinent negatives for primary symptoms include no fever, no cough, no abdominal pain, no nausea and no vomiting.   Associated symptoms include numbness.       Shortness of Breath   Associated symptoms include chest pain and shortness of breath. Pertinent negatives include no fever, no rhinorrhea, no sore throat and no cough.       Past Medical History   Diagnosis Date   . Childhood asthma    . Anxiety    . Depression    . Pollen allergies    . Other and unspecified ovarian cysts      left side   . HPV in female 2013     had cervical biopsies   . Postpartum depression      no meds       Past Surgical History   Procedure Date   . Tubes in ears        History reviewed. No pertinent family history.    Social  History   Substance Use Topics   . Smoking status: Current Some Day Smoker -- 0.5 packs/day     Types: Cigarettes   . Smokeless tobacco: Not on file      Comment: trying to quit, has been smoking since age 51 up to 1 ppd   . Alcohol Use: No       .     Allergies   Allergen Reactions   . Amoxicillin Anaphylaxis   . Ceclor (Cefaclor) Anaphylaxis and Rash   . Macrodantin Anaphylaxis   . Penicillins Anaphylaxis    . Chocolate Nausea And Vomiting   . Flagyl (Metronidazole) Itching   . Sulfa Antibiotics Itching   . Clindamycin Rash   . Latex Rash       Current/Home Medications    PRENATAL VIT-FE SULFATE-FA (PRENATAL VITAMIN PO)    Take 1 tablet by mouth daily.        Review of Systems   Constitutional: Negative for fever and activity change.   HENT: Negative for congestion, sore throat, rhinorrhea and neck pain.    Respiratory: Positive for shortness of breath. Negative for cough.    Cardiovascular: Positive for chest pain.   Gastrointestinal: Negative for nausea, vomiting, abdominal pain, diarrhea and constipation.   Genitourinary: Negative for dysuria, vaginal bleeding and vaginal discharge.        Preg   Musculoskeletal: Negative for back pain.        No trauma   Skin: Negative for color change and rash.   Neurological: Positive for  numbness. Negative for syncope and headaches.   All other systems reviewed and are negative.        Physical Exam    BP 112/70  Pulse 77  Temp 97.6 F (36.4 C)  Resp 18  Ht 1.676 m  Wt 107.502 kg  BMI 38.27 kg/m2  SpO2 97%  LMP 03/19/2012    Physical Exam   Vitals reviewed.  Constitutional: She is oriented to person, place, and time. Vital signs are normal. She appears well-developed and well-nourished. No distress.   HENT:   Head: Normocephalic and atraumatic.   Right Ear: External ear normal.   Left Ear: External ear normal.   Nose: Nose normal.   Mouth/Throat: Oropharynx is clear and moist and mucous membranes are normal.   Eyes: Conjunctivae normal, EOM and lids are normal. Pupils are equal, round, and reactive to light.   Neck: Trachea normal and full passive range of motion without pain. Neck supple. No spinous process tenderness present.   Cardiovascular: Normal rate, regular rhythm, normal heart sounds and normal pulses.    Pulmonary/Chest: Effort normal and breath sounds normal.   Abdominal: Soft. Normal appearance and bowel sounds are normal. She exhibits no distension. There  is no tenderness.   Musculoskeletal: Normal range of motion.        Ext nt with full rom and nvi   Lymphadenopathy:     She has no cervical adenopathy.   Neurological: She is alert and oriented to person, place, and time. She has normal strength. No cranial nerve deficit or sensory deficit. GCS eye subscore is 4. GCS verbal subscore is 5. GCS motor subscore is 6.   Skin: Skin is warm, dry and intact.   Psychiatric: She has a normal mood and affect.       MDM and ED Course     ED Medication Orders      Start     Status Ordering Provider    12/06/12 2358   sodium chloride 0.9 % bolus 500 mL   Once      Route: Intravenous  Ordered Dose: 500 mL         Last MAR action:  Stopped Ceara Wrightson                 MDM  Number of Diagnoses or Management Options  Anemia:   Anxiety:   Chest pain:   Leukocytosis:   Paresthesia:   Pregnancy:   Shortness of breath:   Diagnosis management comments: I, Devanie Galanti, md, have been the primary provider for Micheline Maze during this Emergency Dept visit.    Oxygen saturation by pulse oximetry is 95%-100%, Normal.  Interventions: None Needed.    Sob with chest to back pain and diffuse paresthesias- anxiety most likely and pt agrees to this and not to radiate today in search of pe or other - f/u pcp planned and knows to rter if sxs return    EKG Interpretation:    Rhythm:  Normal Sinus  Rate:  Normal, 90  Axis:  Normal  Conduction:  No blocks  ST/T Segments:  Normal ST segments  Other:         Amount and/or Complexity of Data Reviewed  Clinical lab tests: ordered and reviewed          Procedures    Clinical Impression & Disposition     Clinical Impression  Final diagnoses:   Anemia   Leukocytosis   Chest pain   Shortness of breath  Paresthesia   Anxiety   Pregnancy        ED Disposition     Discharge DENIAH SAIA discharge to home/self care.    Condition at disposition: Stable             New Prescriptions    No medications on file               Michaelle Copas, MD  12/07/12  [phone]

## 2012-12-17 ENCOUNTER — Inpatient Hospital Stay: Payer: No Typology Code available for payment source | Admitting: Anesthesiology

## 2012-12-17 ENCOUNTER — Inpatient Hospital Stay: Payer: Self-pay | Admitting: Obstetrics & Gynecology

## 2012-12-17 ENCOUNTER — Inpatient Hospital Stay
Admission: RE | Admit: 2012-12-17 | Discharge: 2012-12-17 | Disposition: A | Discharge status: Home / Self Care | Payer: No Typology Code available for payment source | Source: Ambulatory Visit

## 2012-12-17 ENCOUNTER — Inpatient Hospital Stay
Admission: RE | Admit: 2012-12-17 | Discharge: 2012-12-19 | DRG: 373 | Disposition: A | Discharge status: Home / Self Care | Payer: No Typology Code available for payment source | Source: Ambulatory Visit | Attending: Obstetrics & Gynecology | Admitting: Obstetrics & Gynecology

## 2012-12-17 ENCOUNTER — Encounter: Payer: Self-pay | Admitting: Anesthesiology

## 2012-12-17 DIAGNOSIS — O99892 Other specified diseases and conditions complicating childbirth: Principal | ICD-10-CM | POA: Diagnosis present

## 2012-12-17 DIAGNOSIS — O480 Post-term pregnancy: Secondary | ICD-10-CM | POA: Diagnosis present

## 2012-12-17 DIAGNOSIS — K429 Umbilical hernia without obstruction or gangrene: Secondary | ICD-10-CM | POA: Diagnosis present

## 2012-12-17 DIAGNOSIS — Z3490 Encounter for supervision of normal pregnancy, unspecified, unspecified trimester: Secondary | ICD-10-CM

## 2012-12-17 LAB — CBC AND DIFFERENTIAL
Basophils Absolute Automated: 0.01 (ref 0.00–0.20)
Basophils Automated: 0 %
Eosinophils Absolute Automated: 0.11 (ref 0.00–0.70)
Eosinophils Automated: 1 %
Hematocrit: 30.9 % — ABNORMAL LOW (ref 37.0–47.0)
Hgb: 9.9 g/dL — ABNORMAL LOW (ref 12.0–16.0)
Immature Granulocytes Absolute: 0.08 — ABNORMAL HIGH
Immature Granulocytes: 1 %
Lymphocytes Absolute Automated: 2.83 (ref 0.50–4.40)
Lymphocytes Automated: 26 %
MCH: 25.1 pg — ABNORMAL LOW (ref 28.0–32.0)
MCHC: 32 g/dL (ref 32.0–36.0)
MCV: 78.2 fL — ABNORMAL LOW (ref 80.0–100.0)
MPV: 9.8 fL (ref 9.4–12.3)
Monocytes Absolute Automated: 0.63 (ref 0.00–1.20)
Monocytes: 6 %
Neutrophils Absolute: 7.37 (ref 1.80–8.10)
Neutrophils: 67 %
Nucleated RBC: 0 (ref 0–1)
Platelets: 234 (ref 140–400)
RBC: 3.95 — ABNORMAL LOW (ref 4.20–5.40)
RDW: 14 % (ref 12–15)
WBC: 10.95 — ABNORMAL HIGH (ref 3.50–10.80)

## 2012-12-17 LAB — TYPE AND SCREEN
AB Screen Gel: NEGATIVE
ABO Rh: O POS

## 2012-12-17 MED ORDER — IBUPROFEN 600 MG PO TABS
600.0000 mg | ORAL_TABLET | Freq: Four times a day (QID) | ORAL | Status: DC | PRN
Start: 2012-12-17 — End: 2012-12-19
  Administered 2012-12-17 – 2012-12-19 (×6): 600 mg via ORAL
  Filled 2012-12-17 (×6): qty 1

## 2012-12-17 MED ORDER — ONDANSETRON HCL 4 MG/2ML IJ SOLN
8.0000 mg | Freq: Three times a day (TID) | INTRAMUSCULAR | Status: DC | PRN
Start: 2012-12-17 — End: 2012-12-17

## 2012-12-17 MED ORDER — FENTANYL 2 MCG/ML+BUPIVACAINE 0.125% 100 ML
EPIDURAL | Status: DC
Start: 2012-12-17 — End: 2012-12-17
  Administered 2012-12-17: 10 mL/h via EPIDURAL
  Filled 2012-12-17: qty 100

## 2012-12-17 MED ORDER — METHYLERGONOVINE MALEATE 0.2 MG/ML IJ SOLN
200.0000 ug | Freq: Four times a day (QID) | INTRAMUSCULAR | Status: DC | PRN
Start: 2012-12-17 — End: 2012-12-19

## 2012-12-17 MED ORDER — NALOXONE HCL 0.4 MG/ML IJ SOLN
0.1000 mg | INTRAMUSCULAR | Status: DC | PRN
Start: 2012-12-17 — End: 2012-12-17

## 2012-12-17 MED ORDER — DOCUSATE SODIUM 100 MG PO CAPS
100.0000 mg | ORAL_CAPSULE | Freq: Two times a day (BID) | ORAL | Status: DC
Start: 2012-12-17 — End: 2012-12-19
  Administered 2012-12-18 – 2012-12-19 (×3): 100 mg via ORAL
  Filled 2012-12-17 (×4): qty 1

## 2012-12-17 MED ORDER — METHYLERGONOVINE MALEATE 0.2 MG PO TABS
0.2000 mg | ORAL_TABLET | Freq: Four times a day (QID) | ORAL | Status: AC | PRN
Start: 2012-12-17 — End: 2012-12-18

## 2012-12-17 MED ORDER — OXYTOCIN 30UNITS IN LR 500 ML PP--OUTSOURCED
INTRAVENOUS | Status: DC
Start: 2012-12-17 — End: 2012-12-21
  Filled 2012-12-17: qty 500

## 2012-12-17 MED ORDER — ACETAMINOPHEN 325 MG PO TABS
650.0000 mg | ORAL_TABLET | ORAL | Status: DC | PRN
Start: 2012-12-17 — End: 2012-12-17

## 2012-12-17 MED ORDER — SIMETHICONE 80 MG PO CHEW
80.0000 mg | CHEWABLE_TABLET | Freq: Four times a day (QID) | ORAL | Status: DC | PRN
Start: 2012-12-17 — End: 2012-12-19
  Administered 2012-12-19: 80 mg via ORAL
  Filled 2012-12-17: qty 1

## 2012-12-17 MED ORDER — LANOLIN EX OINT
TOPICAL_OINTMENT | CUTANEOUS | Status: DC | PRN
Start: 2012-12-17 — End: 2012-12-19

## 2012-12-17 MED ORDER — BUPIVACAINE HCL (PF) 0.25 % IJ SOLN
INTRAMUSCULAR | Status: DC | PRN
Start: 2012-12-17 — End: 2012-12-17
  Administered 2012-12-17: 5 mL via EPIDURAL
  Administered 2012-12-17: 3 mL via EPIDURAL

## 2012-12-17 MED ORDER — NALBUPHINE HCL 10 MG/ML IJ SOLN
10.0000 mg | INTRAMUSCULAR | Status: DC | PRN
Start: 2012-12-17 — End: 2012-12-17

## 2012-12-17 MED ORDER — TERBUTALINE SULFATE 1 MG/ML IJ SOLN
0.2500 mg | Freq: Once | INTRAMUSCULAR | Status: DC | PRN
Start: 2012-12-17 — End: 2012-12-17

## 2012-12-17 MED ORDER — LACTATED RINGERS IV SOLN
INTRAVENOUS | Status: DC | PRN
Start: 2012-12-17 — End: 2012-12-17

## 2012-12-17 MED ORDER — LACTATED RINGERS IV SOLN
INTRAVENOUS | Status: DC
Start: 2012-12-17 — End: 2012-12-17

## 2012-12-17 MED ORDER — LACTATED RINGERS IV BOLUS
500.0000 mL | Freq: Once | INTRAVENOUS | Status: AC
Start: 2012-12-17 — End: 2012-12-17
  Administered 2012-12-17: 500 mL via INTRAVENOUS

## 2012-12-17 MED ORDER — OXYTOCIN 30UNITS IN LR 500 ML PP--OUTSOURCED
7.5000 [IU]/h | INTRAVENOUS | Status: DC
Start: 2012-12-17 — End: 2012-12-19
  Administered 2012-12-17: 7.5 [IU]/h via INTRAVENOUS

## 2012-12-17 MED ORDER — WITCH HAZEL-GLYCERIN EX PADS
1.0000 | MEDICATED_PAD | CUTANEOUS | Status: DC | PRN
Start: 2012-12-17 — End: 2012-12-19
  Administered 2012-12-17: 1 via TOPICAL
  Filled 2012-12-17: qty 40

## 2012-12-17 MED ORDER — CALCIUM CARBONATE ANTACID 500 MG PO CHEW
1000.0000 mg | CHEWABLE_TABLET | Freq: Three times a day (TID) | ORAL | Status: DC | PRN
Start: 2012-12-17 — End: 2012-12-19

## 2012-12-17 MED ORDER — BENZOCAINE-MENTHOL 20-0.5 % EX AERO
1.0000 | INHALATION_SPRAY | CUTANEOUS | Status: DC | PRN
Start: 2012-12-17 — End: 2012-12-19
  Administered 2012-12-17: 1 via TOPICAL
  Filled 2012-12-17: qty 56

## 2012-12-17 MED ORDER — ONDANSETRON HCL 4 MG PO TABS
4.0000 mg | ORAL_TABLET | Freq: Three times a day (TID) | ORAL | Status: DC | PRN
Start: 2012-12-17 — End: 2012-12-19

## 2012-12-17 MED ORDER — HYDROCORTISONE 1 % EX OINT
TOPICAL_OINTMENT | Freq: Three times a day (TID) | CUTANEOUS | Status: DC | PRN
Start: 2012-12-17 — End: 2012-12-19

## 2012-12-17 MED ORDER — IBUPROFEN 600 MG PO TABS
600.0000 mg | ORAL_TABLET | Freq: Once | ORAL | Status: AC | PRN
Start: 2012-12-17 — End: 2012-12-17
  Administered 2012-12-17: 600 mg via ORAL
  Filled 2012-12-17: qty 1

## 2012-12-17 MED ORDER — OXYTOCIN 30 UNITS IN LR 500 ML LABOR -OUTSOURCED
4.0000 m[IU]/min | INTRAVENOUS | Status: DC
Start: 2012-12-17 — End: 2012-12-17
  Administered 2012-12-17: 4 m[IU]/min via INTRAVENOUS
  Filled 2012-12-17: qty 500

## 2012-12-17 MED ORDER — OXYCODONE-ACETAMINOPHEN 5-325 MG PO TABS
1.0000 | ORAL_TABLET | Freq: Once | ORAL | Status: DC | PRN
Start: 2012-12-17 — End: 2012-12-19

## 2012-12-17 MED ORDER — ACETAMINOPHEN-CODEINE #3 300-30 MG PO TABS
2.0000 | ORAL_TABLET | ORAL | Status: DC | PRN
Start: 2012-12-17 — End: 2012-12-19

## 2012-12-17 MED ORDER — LACTATED RINGERS IV BOLUS
1000.0000 mL | Freq: Once | INTRAVENOUS | Status: DC
Start: 2012-12-17 — End: 2012-12-17

## 2012-12-17 MED ORDER — MAGNESIUM HYDROXIDE 400 MG/5ML PO SUSP
30.0000 mL | Freq: Four times a day (QID) | ORAL | Status: DC | PRN
Start: 2012-12-17 — End: 2012-12-19

## 2012-12-17 NOTE — Progress Notes (Signed)
Mom and baby transferred from L&D to [facility] room  354 ID bands on baby's ankles, match mom's.  Verified with L&D RN.  Correct pediatrician verified with mom.

## 2012-12-17 NOTE — Plan of Care (Signed)
Problem: Pain/Discomfort  Goal: Pain/discomfort is manageable.  Pain/discomfort is manageable by medication.  Will continue to monitor.

## 2012-12-17 NOTE — Progress Notes (Signed)
Report received from A.Manino,RN care of pt assumed.

## 2012-12-17 NOTE — Progress Notes (Signed)
Gave report to Maryla Morrow, RN.  Release of pt care

## 2012-12-17 NOTE — Anesthesia Procedure Notes (Signed)
Epidural    Patient location during procedure: L&D  Reason for block: maternal indication  Start time: 12/17/2012 9:45 AM  End time: 12/17/2012 10:04 AM  Staffing  Anesthesiologist: Fae Pippin  Performed by: anesthesiologist Preanesthetic Checklist Completed: patient identified, surgical consent, pre-op evaluation, timeout performed, risks and benefits discussed, monitors and equipment checked, anesthesia consent given and correct site      Epidural    Patient monitoring: Pulse oximetry, EKG and NIBP  Premedication: Meaningful Contact Maintained    Patient position: sitting  Sterile Technique: ChloraPrep  Skin Local: lidocaine 2%      Interspace: L3-4  Number of attempts: 1    Approach: midline  Needle type: Touhy needle     Needle gauge: 17    Injection technique: LOR air  Epidural Space ID: 5 cm  CSF Return: No   Blood Return: No  Paresthesia Pain: no  Needle type: Touhy needle     Needle gauge: 17  CSF Return: No  Blood Return: No          Paresthesia Pain: no    Catheter type: side hole    Catheter size: 21 G    Catheter at skin depth: 7 cm  CSF Return: No  Blood Return: No  Test Dose:1.5 % lidocaine3 cc  Incremental injection: yes  Injection made incrementally with aspirations every 5 mL.  Pressure Paresthesia: no  No Catheter IV/SA Signs or SymptomsAssessment   Sensory level: T10  Block Outcome: patient tolerated procedure well, no complications and successful blockBlock at Surgeon's request: Yes

## 2012-12-17 NOTE — Anesthesia Preprocedure Evaluation (Signed)
Anesthesia Evaluation    AIRWAY    Mallampati: II    TM distance: >3 FB  Neck ROM: full  Mouth Opening:full   CARDIOVASCULAR    normal       DENTAL    No notable dental hx     PULMONARY    pulmonary exam normal     OTHER FINDINGS                      Anesthesia Plan    ASA 3     epidural                             Post op pain management: per surgeon    informed consent obtained      pertinent labs reviewed

## 2012-12-17 NOTE — Progress Notes (Signed)
Dr.Donald at bedside, pt set up for delivery. Consulting civil engineer notified.

## 2012-12-18 NOTE — Progress Notes (Signed)
Postpartum Note    Subjective:  Delivery Type: Vaginal   No complaints.  Minimal Bleeding.  Breastfeeding well.    Objective:  Vital Signs:  Temp:  [96.8 F (36 C)-98.6 F (37 C)] 96.8 F (36 C)  Heart Rate:  [67-86] 76   Resp Rate:  [18] 18   BP: (94-129)/(52-76) 102/66 mmHg  Breast: soft, non tender   Fundus: firm, non tender  Lochia: mod, rubra.    Recent Labs:    Lab 12/17/12 0747   WBC 10.95*   HGB 9.9*   HCT 30.9*   PLT 234   NEUTROPCT --   LYMPHOPCT --   MONOPCT --   EOSPCT --       Assessment/Plan:  Stable - Postpartum Day 1  Routine care, discharge  tomorrow, F/U in 6 weeks.  Prescriptions given: PNV,  none.

## 2012-12-18 NOTE — Progress Notes (Signed)
Patient doing well, VSS, no headache, motor, sensory function intact. No epidural complaints/complications noted.      Sharvi Mooneyhan M.D.

## 2012-12-18 NOTE — Plan of Care (Signed)
Problem: Cesarean and Vaginal Delivery-Postpartum Care  Goal: Postpartum hemorrhage is prevented or managed promptly  No signs and symptoms of PPH

## 2012-12-19 ENCOUNTER — Other Ambulatory Visit: Payer: Self-pay

## 2012-12-19 MED ORDER — IBUPROFEN 600 MG PO TABS
600.0000 mg | ORAL_TABLET | Freq: Four times a day (QID) | ORAL | 0 refills | Status: DC | PRN
Start: 2012-12-19 — End: 2013-08-24
  Filled 2012-12-19: qty 30, 8d supply, fill #0

## 2012-12-19 MED ORDER — DSS 100 MG PO CAPS
100.0000 mg | ORAL_CAPSULE | Freq: Two times a day (BID) | ORAL | Status: DC
Start: 2012-12-19 — End: 2013-08-24

## 2012-12-19 NOTE — Plan of Care (Signed)
Problem: Pain/Discomfort  Goal: Pain/discomfort is manageable.  Pain/discomfort is manageable by medication.  Will continue to monitor.

## 2012-12-19 NOTE — Plan of Care (Signed)
Problem: Parenting/Coping- Postpartum  Goal: Positive mother-baby interactions are observed.  Both parents actively involved in care of baby, ready for d/c.

## 2012-12-19 NOTE — Progress Notes (Signed)
Postpartum Day 2    S: Pt doing well. Pt ready to go home today. Pain managed well.  Bottle feeding, no breast problems  +voiding and ambulating without difficulty  No BM yet    O: VSS  Abdomen soft, NT, +hernia  Fundus firm @U -3, scant rubra  No S/S DVT    A: Stable SVD recovery    P: Discharge pt to home  Office visit in 6 weeks  Continue daily Colace until first BM

## 2012-12-19 NOTE — Progress Notes (Signed)
Discharge order entered.  Vaccine screening complete and vaccines given.    [place] instructions given.  Questions answered.  Mom [place] at 13 with baby

## 2012-12-19 NOTE — Discharge Instructions (Signed)
Call your own health care provider if you have:   Burning or pain in your breast   Red streaks or hard lumpy areas in your breast   Cracks or blisters, or see blood on your nipples   A fever or chills   Extreme tiredness or body aches, as if you had the flu   Feelings of extreme sadness or anxiety, or a feeling that you don't want to be with your baby   Abdominal pain that isn't relieved with medicine   Vaginal discharge that has a bad odor   A temperature of 101.63F or higher

## 2013-08-08 ENCOUNTER — Emergency Department
Admission: EM | Admit: 2013-08-08 | Discharge: 2013-08-08 | Disposition: A | Discharge status: Home / Self Care | Payer: No Typology Code available for payment source | Attending: Emergency Medicine | Admitting: Emergency Medicine

## 2013-08-08 ENCOUNTER — Emergency Department: Payer: No Typology Code available for payment source

## 2013-08-08 DIAGNOSIS — A084 Viral intestinal infection, unspecified: Secondary | ICD-10-CM

## 2013-08-08 DIAGNOSIS — A088 Other specified intestinal infections: Secondary | ICD-10-CM | POA: Insufficient documentation

## 2013-08-08 DIAGNOSIS — F17200 Nicotine dependence, unspecified, uncomplicated: Secondary | ICD-10-CM | POA: Insufficient documentation

## 2013-08-08 LAB — CBC AND DIFFERENTIAL
Basophils Absolute Automated: 0.01 10*3/uL (ref 0.00–0.20)
Basophils Automated: 0 %
Eosinophils Absolute Automated: 0.12 10*3/uL (ref 0.00–0.70)
Eosinophils Automated: 3 %
Hematocrit: 42.1 % (ref 37.0–47.0)
Hgb: 13.5 g/dL (ref 12.0–16.0)
Immature Granulocytes Absolute: 0 10*3/uL
Immature Granulocytes: 0 %
Lymphocytes Absolute Automated: 1.9 10*3/uL (ref 0.50–4.40)
Lymphocytes Automated: 43 %
MCH: 25.1 pg — ABNORMAL LOW (ref 28.0–32.0)
MCHC: 32.1 g/dL (ref 32.0–36.0)
MCV: 78.4 fL — ABNORMAL LOW (ref 80.0–100.0)
MPV: 9.9 fL (ref 9.4–12.3)
Monocytes Absolute Automated: 0.45 10*3/uL (ref 0.00–1.20)
Monocytes: 10 %
Neutrophils Absolute: 1.94 10*3/uL (ref 1.80–8.10)
Neutrophils: 44 %
Platelets: 204 10*3/uL (ref 140–400)
RBC: 5.37 10*6/uL (ref 4.20–5.40)
RDW: 14 % (ref 12–15)
WBC: 4.42 10*3/uL (ref 3.50–10.80)

## 2013-08-08 LAB — URINALYSIS
Glucose, UA: NEGATIVE
Ketones UA: NEGATIVE
Leukocyte Esterase, UA: NEGATIVE
Nitrite, UA: NEGATIVE
Specific Gravity UA: 1.034 (ref 1.001–1.035)
Urine pH: 6 (ref 5.0–8.0)
Urobilinogen, UA: 0.2 mg/dL (ref 0.2–2.0)

## 2013-08-08 LAB — COMPREHENSIVE METABOLIC PANEL
ALT: 10 U/L (ref 0–55)
AST (SGOT): 13 U/L (ref 5–34)
Albumin/Globulin Ratio: 1.4 (ref 0.9–2.2)
Albumin: 3.9 g/dL (ref 3.5–5.0)
Alkaline Phosphatase: 51 U/L (ref 37–106)
Anion Gap: 11 (ref 5.0–15.0)
BUN: 11.3 mg/dL (ref 7.0–19.0)
Bilirubin, Total: 0.2 mg/dL (ref 0.2–1.2)
CO2: 21 mEq/L — ABNORMAL LOW (ref 22–29)
Calcium: 8.9 mg/dL (ref 8.5–10.5)
Chloride: 109 mEq/L (ref 100–111)
Creatinine: 0.9 mg/dL (ref 0.6–1.0)
Globulin: 2.7 g/dL (ref 2.0–3.6)
Glucose: 83 mg/dL (ref 70–100)
Potassium: 3.5 mEq/L (ref 3.5–5.1)
Protein, Total: 6.6 g/dL (ref 6.0–8.3)
Sodium: 141 mEq/L (ref 136–145)

## 2013-08-08 LAB — HCG, SERUM, QUALITATIVE: Hcg Qualitative: NEGATIVE

## 2013-08-08 LAB — GFR: EGFR: >60

## 2013-08-08 LAB — URINE MICROSCOPIC

## 2013-08-08 LAB — LIPASE: Lipase: 41 U/L (ref 8–78)

## 2013-08-08 MED ORDER — SODIUM CHLORIDE 0.9 % IV BOLUS
1000.0000 mL | Freq: Once | INTRAVENOUS | Status: AC
Start: 2013-08-08 — End: 2013-08-08
  Administered 2013-08-08: 1000 mL via INTRAVENOUS

## 2013-08-08 MED ORDER — ONDANSETRON HCL 4 MG/2ML IJ SOLN
4.0000 mg | Freq: Once | INTRAMUSCULAR | Status: AC
Start: 2013-08-08 — End: 2013-08-08
  Administered 2013-08-08: 4 mg via INTRAVENOUS
  Filled 2013-08-08: qty 2

## 2013-08-08 MED ORDER — KETOROLAC TROMETHAMINE 30 MG/ML IJ SOLN
30.0000 mg | Freq: Once | INTRAMUSCULAR | Status: AC
Start: 2013-08-08 — End: 2013-08-08
  Administered 2013-08-08: 30 mg via INTRAVENOUS
  Filled 2013-08-08: qty 1

## 2013-08-08 MED ORDER — ONDANSETRON 4 MG PO TBDP
4.0000 mg | ORAL_TABLET | Freq: Four times a day (QID) | ORAL | Status: DC | PRN
Start: 2013-08-08 — End: 2013-08-24

## 2013-08-08 NOTE — Discharge Instructions (Signed)
Vomiting / Diarrhea (Gastro), Viral    You have been diagnosed with vomiting and diarrhea, also called gastroenteritis or "stomach flu." In your case, the cause is thought to be a virus.    Although the term "stomach flu" is often used, gastroenteritis IS NOT THE FLU. The real flu is a serious respiratory (lung) infection caused by a virus called influenza.    Gastroenteritis almost always causes diarrhea and sometimes, bloody diarrhea. It might also cause abdominal cramping, vomiting, or fever (temperature higher than 100.4F / 38C). If you vomit, but do not have diarrhea, you may not have gastroenteritis. The usual treatment is medication for nausea and diarrhea. It might be helpful to avoid eating for the next day. Drink clear liquids, including water, broth, or diluted (watered down) juice. Avoid large heavy meals that make you sick to your stomach. Add more foods as you feel better. Symptoms usually improve over a few days but can last up to a week.    It is important to get enough fluids when you have a stomach virus.    Follow up with your primary doctor if you do not improve or if you feel worse.    YOU SHOULD SEEK MEDICAL ATTENTION IMMEDIATELY, EITHER HERE OR AT THE NEAREST EMERGENCY DEPARTMENT, IF ANY OF THE FOLLOWING OCCURS:   You do not urinate at least once every 8 hours.   Your vomiting becomes more frequent or you cannot keep fluids down.   Your vomit is bloody or dark green or your stool is bloody.   You do not improve over 2 to 3 days.   You develop any new symptoms or concerns.

## 2013-08-08 NOTE — ED Provider Notes (Signed)
I, Nita Sells, M.D., have personally seen and examined this patient, and have fully participated in her care.  I agree with all pertinent and available clinical information, including history, physical examination, assessment, and plan as documented by the [facility] except as noted.    On my exam, abd nontender. Improved. Family members with similar symptoms. No sign of appy. Supportive care and will return if worsens.    Leticia Clas, MD  08/08/13 [phone]

## 2013-08-08 NOTE — ED Notes (Signed)
Pt. Aware urine specimen needed.  Pt. States "able to eat today", but still nauseated.  Diarrhea this morning.  C/o abd pain after vomiting.

## 2013-08-08 NOTE — ED Provider Notes (Signed)
Physician/Midlevel provider first contact with patient: 08/08/13 1532         History     Chief Complaint   Patient presents with   . Emesis     Patient is a 23 y.o. female presenting with vomiting and diarrhea. The history is provided by the patient.   Emesis   This is a new problem. Episode onset: 2 days. Episode frequency: none today, two times yesterday. The problem has been gradually improving. The emesis has an appearance of stomach contents. There has been no fever. Associated symptoms include abdominal pain, chills and diarrhea. Pertinent negatives include no cough, no fever, no headaches and no URI. Associated symptoms comments: Intermittent abd crampping. Currently no pain.   Diarrhea  The primary symptoms include abdominal pain, vomiting and diarrhea. Primary symptoms do not include fever, nausea, dysuria or rash. The illness began 2 days ago. The onset was gradual.   The illness is also significant for chills. The illness does not include back pain. Risk factors: daughter and boyfriend also with similar sx at home.       Past Medical History   Diagnosis Date   . Childhood asthma    . Anxiety    . Depression    . Pollen allergies    . Other and unspecified ovarian cysts      left side   . HPV in female 2013     had cervical biopsies   . Postpartum depression      no meds       Past Surgical History   Procedure Date   . Tubes in ears        Family History   Problem Relation Age of Onset   . Drug abuse Father    . Drug abuse Maternal Aunt    . Alcohol abuse Maternal Uncle    . Depression Maternal Uncle    . Drug abuse Maternal Grandfather        Social  History   Substance Use Topics   . Smoking status: Current Some Day Smoker -- 0.5 packs/day     Types: Cigarettes   . Smokeless tobacco: Never Used      Comment: trying to quit, has been smoking since age 59 up to 1 ppd   . Alcohol Use: No       .     Allergies   Allergen Reactions   . Amoxicillin Anaphylaxis   . Ceclor (Cefaclor) Anaphylaxis and Rash   .  Macrodantin Anaphylaxis   . Penicillins Anaphylaxis   . Chocolate Nausea And Vomiting   . Flagyl (Metronidazole) Itching   . Sulfa Antibiotics Itching   . Clindamycin Rash   . Latex Rash       Current/Home Medications    DOCUSATE SODIUM 100 MG CAP    Take 100 mg by mouth 2 (two) times daily.    IBUPROFEN (ADVIL,MOTRIN) 600 MG TABLET    Take 1 tablet (600 mg total) by mouth every 6 (six) hours as needed.    PRENATAL VIT-FE SULFATE-FA (PRENATAL VITAMIN PO)    Take 1 tablet by mouth daily.        Review of Systems   Constitutional: Positive for chills. Negative for fever.   HENT: Negative.  Negative for congestion and rhinorrhea.    Respiratory: Negative for cough, chest tightness and shortness of breath.    Cardiovascular: Negative for chest pain and palpitations.   Gastrointestinal: Positive for vomiting, abdominal pain and diarrhea. Negative for  nausea.   Genitourinary: Negative for dysuria, frequency and flank pain.   Musculoskeletal: Negative for back pain and neck pain.   Skin: Negative for rash.   Neurological: Negative for dizziness, weakness, light-headedness and headaches.   Psychiatric/Behavioral: Negative for confusion, self-injury and decreased concentration.   All other systems reviewed and are negative.        Physical Exam    BP: 118/76 mmHg, Heart Rate: 81 , Temp: 97.3 F (36.3 C), Resp Rate: 16 , SpO2: 99 %    Physical Exam   Nursing note and vitals reviewed.  Constitutional: She is oriented to person, place, and time. She appears well-developed and well-nourished.   HENT:   Head: Normocephalic and atraumatic.   Eyes: Conjunctivae normal and EOM are normal. No scleral icterus.   Neck: Normal range of motion. Neck supple.        No meningeal signs   Cardiovascular: Normal rate, regular rhythm, normal heart sounds and intact distal pulses.    Pulses:       Dorsalis pedis pulses are 2+ on the right side, and 2+ on the left side.   Pulmonary/Chest: Effort normal and breath sounds normal.   Abdominal:  Soft. Bowel sounds are normal. She exhibits no distension. There is no tenderness. There is no rigidity, no rebound, no guarding, no CVA tenderness, no tenderness at McBurney's point and negative Murphy's sign.        No pulsatile mass   Musculoskeletal: Normal range of motion. She exhibits no edema and no tenderness.   Neurological: She is alert and oriented to person, place, and time. She has normal strength. GCS eye subscore is 4. GCS verbal subscore is 5. GCS motor subscore is 6.   Skin: Skin is warm and dry. No rash noted. No pallor.   Psychiatric: She has a normal mood and affect. Her behavior is normal. Thought content normal.       MDM and ED Course     ED Medication Orders      Start     Status Ordering Provider    08/08/13 1617   ketorolac (TORADOL) injection 30 mg   Once      Route: Intravenous  Ordered Dose: 30 mg         Last MAR action:  Given Waris Rodger    08/08/13 1543   ondansetron (ZOFRAN) injection 4 mg   Once      Route: Intravenous  Ordered Dose: 4 mg         Last MAR action:  Given Esly Selvage    08/08/13 1523   sodium chloride 0.9 % bolus 1,000 mL   Once      Route: Intravenous  Ordered Dose: 1,000 mL         Last MAR action:  Stopped Dellia Donnelly                 MDM  Number of Diagnoses or Management Options  Viral gastroenteritis:   Diagnosis management comments: Pt with nausea, vomiting and diarrhea started two days ago. Two family members at home sick with similar. Diarrhea, watery, about 10 times yesterday, once today. Vomited yesterday, but none today. Intermittent abd cramping. States sx have improved, but unsure why she still has sx since her boyfriend only had sx for 1 day.     Pt has no RF for c dif. No recent abx, use, travel. Unable to obtain stool sample in the ED>     On exam, pt  well appearing. abd soft and nontender. nml vitals. Plan labs and IV fluids.     Labs nml. UA dirty catch. Pt has no UTI sx. Plan urine culture and will call pt if positive. Recheck abd exam at dispo  remains soft and nontender and pt feels improved.no signs of appy.  Plan supportive tx.     I, Carolina Sink- Physician Assistant, have been the primary provider for this pt during their ED stay.     The attending signature signifies review and agreement of the history, physical examination, evaluation, clinical impression and plan except as noted.     02 sat 99 %ra, which is nml.           Procedures  Results     Procedure Component Value Units Date/Time    Urinalysis [[id number]]  (Abnormal) Collected:08/08/13 1549    Specimen Information:Urine Updated:08/08/13 1615     Urine Type Clean Catch      Color, UA YELLOW      Clarity, UA SL CLOUDY (A)      Specific Gravity UA 1.034      Urine pH 6.0      Leukocyte Esterase, UA NEGATIVE      Nitrite, UA NEGATIVE      Protein, UR TRACE (A)      Glucose, UA NEGATIVE      Ketones UA NEGATIVE      Urobilinogen, UA 0.2 mg/dL      Bilirubin, UA SMALL (A)      Blood, UA LARGE (A)     Microscopic, Urine [[id number]]  (Abnormal) Collected:08/08/13 1549     RBC, UA 26 - 50 (A) /hpf Updated:08/08/13 1615     WBC, UA 6 - 10 (A) /hpf      Squamous Epithelial Cells, Urine 26 - 50 (A) /hpf      Urine Bacteria Moderate (A) /hpf      Urine Mucus Present     Comprehensive metabolic panel [[id number]]  (Abnormal) Collected:08/08/13 1541    Specimen Information:Blood Updated:08/08/13 1610     Glucose 83 mg/dL      BUN 29.5 mg/dL      Creatinine 0.9 mg/dL      Sodium 284 mEq/L      Potassium 3.5 mEq/L      Chloride 109 mEq/L      CO2 21 (L) mEq/L      CALCIUM 8.9 mg/dL      Protein, Total 6.6 g/dL      Albumin 3.9 g/dL      AST (SGOT) 13 U/L      ALT 10 U/L      Alkaline Phosphatase 51 U/L      Bilirubin, Total 0.2 mg/dL      Globulin 2.7 g/dL      Albumin/Globulin Ratio 1.4      Anion Gap 11.0     Lipase [[id number]] Collected:08/08/13 1541    Specimen Information:Blood Updated:08/08/13 1610     Lipase 41 U/L     GFR [[id number]] Collected:08/08/13 1541     EGFR >60.0 Updated:08/08/13 1610    Beta  HCG, Qual, Serum [[id number]] Collected:08/08/13 1541    Specimen Information:Blood Updated:08/08/13 1600     Hcg Qualitative Negative     CBC with differential [[id number]]  (Abnormal) Collected:08/08/13 1541    Specimen Information:Blood / Blood Updated:08/08/13 1553     WBC 4.42 x10 3/uL      RBC 5.37 x10 6/uL      Hgb 13.5 g/dL  Hematocrit 42.1 %      MCV 78.4 (L) fL      MCH 25.1 (L) pg      MCHC 32.1 g/dL      RDW 14 %      Platelets 204 x10 3/uL      MPV 9.9 fL      Neutrophils 44 %      Lymphocytes Automated 43 %      Monocytes 10 %      Eosinophils Automated 3 %      Basophils Automated 0 %      Immature Granulocyte 0 %      Neutrophils Absolute 1.94 x10 3/uL      Abs Lymph Automated 1.90 x10 3/uL      Abs Mono Automated 0.45 x10 3/uL      Abs Eos Automated 0.12 x10 3/uL      Absolute Baso Automated 0.01 x10 3/uL      Absolute Immature Granulocyte 0.00 x10 3/uL         Radiology Results (24 Hour)     ** No Results found for the last 24 hours. **          Clinical Impression & Disposition     Clinical Impression  Final diagnoses:   Viral gastroenteritis        ED Disposition     Discharge MERCADES BAJAJ discharge to home/self care.    Condition at disposition: Stable             New Prescriptions    ONDANSETRON (ZOFRAN ODT) 4 MG DISINTEGRATING TABLET    Take 1 tablet (4 mg total) by mouth every 6 (six) hours as needed for Nausea.                 Mariel Aloe [place], [place]  08/08/13 [phone]

## 2013-08-24 ENCOUNTER — Emergency Department: Payer: No Typology Code available for payment source

## 2013-08-24 ENCOUNTER — Emergency Department
Admission: EM | Admit: 2013-08-24 | Discharge: 2013-08-24 | Disposition: A | Discharge status: Home / Self Care | Payer: No Typology Code available for payment source | Attending: Emergency Medicine | Admitting: Emergency Medicine

## 2013-08-24 DIAGNOSIS — F17200 Nicotine dependence, unspecified, uncomplicated: Secondary | ICD-10-CM | POA: Insufficient documentation

## 2013-08-24 DIAGNOSIS — S62602A Fracture of unspecified phalanx of right middle finger, initial encounter for closed fracture: Secondary | ICD-10-CM

## 2013-08-24 DIAGNOSIS — S62308A Unspecified fracture of other metacarpal bone, initial encounter for closed fracture: Secondary | ICD-10-CM

## 2013-08-24 DIAGNOSIS — IMO0002 Reserved for concepts with insufficient information to code with codable children: Secondary | ICD-10-CM | POA: Insufficient documentation

## 2013-08-24 DIAGNOSIS — S62309A Unspecified fracture of unspecified metacarpal bone, initial encounter for closed fracture: Secondary | ICD-10-CM | POA: Insufficient documentation

## 2013-08-24 MED ORDER — KETOROLAC TROMETHAMINE 30 MG/ML IJ SOLN
60.0000 mg | Freq: Once | INTRAMUSCULAR | Status: AC
Start: 2013-08-24 — End: 2013-08-24
  Administered 2013-08-24: 60 mg via INTRAMUSCULAR
  Filled 2013-08-24: qty 2

## 2013-08-24 MED ORDER — ONDANSETRON 4 MG PO TBDP
4.0000 mg | ORAL_TABLET | Freq: Four times a day (QID) | ORAL | Status: DC | PRN
Start: 2013-08-24 — End: 2014-01-03

## 2013-08-24 MED ORDER — LIDOCAINE HCL (PF) 1 % IJ SOLN
5.0000 mL | Freq: Once | INTRAMUSCULAR | Status: AC
Start: 2013-08-24 — End: 2013-08-24
  Administered 2013-08-24: 5 mL via SUBCUTANEOUS
  Filled 2013-08-24: qty 5

## 2013-08-24 MED ORDER — HYDROCODONE-ACETAMINOPHEN 5-325 MG PO TABS
1.0000 | ORAL_TABLET | Freq: Four times a day (QID) | ORAL | Status: DC | PRN
Start: 2013-08-24 — End: 2014-01-03

## 2013-08-24 MED ORDER — HYDROCODONE-ACETAMINOPHEN 5-325 MG PO TABS
1.0000 | ORAL_TABLET | Freq: Once | ORAL | Status: DC
Start: 2013-08-24 — End: 2013-08-24
  Filled 2013-08-24: qty 1
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
## 2013-08-24 NOTE — Discharge Instructions (Signed)
Dislocation, Finger    You have been seen for a dislocation of your finger.    The dislocation has been reduced (repaired). The bones are now in their normal position.    A dislocation is when one of the 2 bones that make up a joint gets out of place. It is no longer in the right place to let the joint bend. Sometimes a ligament or set of ligaments is injured during the dislocation. This often happens with dislocations of the fingers and toes. There may also be a fracture (broken bone) with a dislocation. This can happen if the ligament holding the bones together tears away from the bone and takes a small chip with it.    After the bones are relocated (moved), general care involves medicine to reduce pain. It also includes a splint/cast to reduce movement. It also includes Rest, Ice, Compression and Elevation of the injured area. Remember this as "RICE."   REST: Limit the use of the injured body part.   ICE: By applying ice to the affected area, swelling and pain can be reduced. Place some ice cubes in a re-sealable (Ziploc) bag and add some water. Put a thin washcloth between the bag and the skin. Apply the ice bag to the area for at least 20 minutes. Do this at least 4 times per day. It is okay to do this more often than directed. You can also do it for longer than directed. NEVER APPLY ICE DIRECTLY TO THE SKIN.   COMPRESS: Compression means to apply pressure around the injured area such as with a splint, cast or an ACE bandage. Compression decreases swelling and improves comfort. Compression should be tight enough to relieve swelling but not so tight as to decrease circulation. Increasing pain, numbness, tingling, or change in skin color, are all signs of decreased circulation.   ELEVATE: Elevate the injured part. For example, an arm can be elevated by using a sling while standing and sitting and it can be propped up on pillows while in lying down.    You have been placed in a dynamic splint. This type  of splinting is also called "buddy taping." The injured finger is taped to the next, uninjured finger. This allows for maximum use of the finger. It also helps prevent stiffness by allowing movement. Use the guidelines below to care for your injury:   Take off the buddy tape every 2-3 days. Then, replace it with fresh tape. Put some cotton or soft gauze between the fingers before taping them together.   Use the buddy taping for at least 3-5 weeks.    YOU SHOULD SEEK MEDICAL ATTENTION IMMEDIATELY, EITHER HERE OR AT THE NEAREST EMERGENCY DEPARTMENT, IF ANY OF THE FOLLOWING OCCURS:   The affected area gets swollen or much more painful.   New numbness and tingling in the affected area.   You develop a cold, pale finger that seems to have blood supply problems.         Metacarpal Fracture    You have a fracture of a bone in your hand.    Your fracture is in a metacarpal bone. These bones make up the palm of the hand.    A fracture is a break in a bone. It means the same thing as saying a "broken bone." In general, fractures heal in about 6-8 weeks. Over time, the broken area gets stronger than the area around it. At first, fractures are often treated with a splint. The splint keeps  the injured area adequately immobilized (still). It is a temporary solution. The doctor or orthopedic (bone) doctor will probably change it to a cast. Most fractures can be managed with a splint or cast, but some need surgery for the best alignment and fracture correction. The orthopedic doctor will help decide on this.    Generally, fracture treatment includes pain medicine and a splint/cast to reduce movement. Treatment also includes Resting, Icing, Compressing and Elevating the injured area. Remember this as "RICE."   REST: Limit the use of the injured body part.   ICE: By applying ice to the affected area, swelling and pain can be reduced. Place some ice cubes in a re-sealable (Ziploc) bag and add some water. Put a thin  washcloth between the bag and the skin. Apply the ice bag to the area for at least 20 minutes. Do this at least 4 times per day. Using the ice for longer times and more frequently is OK. NEVER APPLY ICE DIRECTLY TO THE SKIN.   COMPRESS: Compression means to apply pressure around the injured area such as with a splint, cast or an ACE bandage. Compression decreases swelling and improves comfort. Compression should be tight enough to relieve swelling but not so tight as to decrease circulation. Increasing pain, numbness, tingling, or change in skin color, are all signs of decreased circulation.   ELEVATE: Elevate the injured part. A fractured arm can be elevated by placing the arm in a sling while awake and propped up on pillows while lying down.    You have been given a splint for your fracture. This is to lower pain and help keep the injured area from moving. Use the splint until follow-up with the orthopedic (bone) doctor.    Use these SPLINT CARE instructions to care for your splint: Do the following often throughout the day:   Check capillary refill (circulation) in the nail beds. Press on the nail bed and then release. It should turn white when you press on it. It should then get pink again in less than 2 seconds after you let go.   Watch to see if the area beyond the splint gets swollen.   The splint may be too tight if the skin of the hand or fingers is very cold, pale or numb to the touch. The wrap holding the splint in place can be loosened. You can come back here or go to the nearest Emergency Department to have it adjusted.    YOU SHOULD SEEK MEDICAL ATTENTION IMMEDIATELY, EITHER HERE OR AT THE NEAREST EMERGENCY DEPARTMENT, IF ANY OF THE FOLLOWING OCCURS:   Severe increase in pain or swelling in the injured area.   New numbness or tingling in or below the injured area.   Your hand gets cold and pale. This could mean the hand has a problem with its blood supply.
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
## 2013-12-13 ENCOUNTER — Emergency Department
Admission: EM | Admit: 2013-12-13 | Discharge: 2013-12-13 | Disposition: A | Discharge status: Home / Self Care | Payer: Medicaid Other | Attending: Emergency Medicine | Admitting: Emergency Medicine

## 2013-12-13 ENCOUNTER — Emergency Department: Payer: No Typology Code available for payment source

## 2013-12-13 ENCOUNTER — Emergency Department: Payer: Medicaid Other

## 2013-12-13 DIAGNOSIS — T07XXXA Unspecified multiple injuries, initial encounter: Secondary | ICD-10-CM | POA: Insufficient documentation

## 2013-12-13 DIAGNOSIS — S60021A Contusion of right index finger without damage to nail, initial encounter: Secondary | ICD-10-CM

## 2013-12-13 DIAGNOSIS — F17200 Nicotine dependence, unspecified, uncomplicated: Secondary | ICD-10-CM | POA: Insufficient documentation

## 2013-12-13 DIAGNOSIS — S6000XA Contusion of unspecified finger without damage to nail, initial encounter: Secondary | ICD-10-CM | POA: Insufficient documentation

## 2013-12-13 DIAGNOSIS — Y9301 Activity, walking, marching and hiking: Secondary | ICD-10-CM | POA: Insufficient documentation

## 2013-12-13 DIAGNOSIS — IMO0002 Reserved for concepts with insufficient information to code with codable children: Secondary | ICD-10-CM | POA: Insufficient documentation

## 2013-12-13 DIAGNOSIS — Y92410 Unspecified street and highway as the place of occurrence of the external cause: Secondary | ICD-10-CM | POA: Insufficient documentation

## 2013-12-13 LAB — COMPREHENSIVE METABOLIC PANEL
ALT: 10 U/L (ref 0–55)
AST (SGOT): 14 U/L (ref 5–34)
Albumin/Globulin Ratio: 1.5 (ref 0.9–2.2)
Albumin: 3.7 g/dL (ref 3.5–5.0)
Alkaline Phosphatase: 57 U/L (ref 37–106)
Anion Gap: 9 (ref 5.0–15.0)
BUN: 14.2 mg/dL (ref 7.0–19.0)
Bilirubin, Total: 0.4 mg/dL (ref 0.2–1.2)
CO2: 24 mEq/L (ref 22–29)
Calcium: 8.9 mg/dL (ref 8.5–10.5)
Chloride: 110 mEq/L (ref 100–111)
Creatinine: 0.9 mg/dL (ref 0.6–1.0)
Globulin: 2.5 g/dL (ref 2.0–3.6)
Glucose: 87 mg/dL (ref 70–100)
Potassium: 3.9 mEq/L (ref 3.5–5.1)
Protein, Total: 6.2 g/dL (ref 6.0–8.3)
Sodium: 143 mEq/L (ref 136–145)

## 2013-12-13 LAB — HCG, SERUM, QUALITATIVE: Hcg Qualitative: NEGATIVE

## 2013-12-13 LAB — CBC AND DIFFERENTIAL
Basophils Absolute Automated: 0.01 10*3/uL (ref 0.00–0.20)
Basophils Automated: 0 %
Eosinophils Absolute Automated: 0.13 10*3/uL (ref 0.00–0.70)
Eosinophils Automated: 1 %
Hematocrit: 38.7 % (ref 37.0–47.0)
Hgb: 12.4 g/dL (ref 12.0–16.0)
Immature Granulocytes Absolute: 0.03 10*3/uL
Immature Granulocytes: 0 %
Lymphocytes Absolute Automated: 2.51 10*3/uL (ref 0.50–4.40)
Lymphocytes Automated: 20 %
MCH: 25.2 pg — ABNORMAL LOW (ref 28.0–32.0)
MCHC: 32 g/dL (ref 32.0–36.0)
MCV: 78.5 fL — ABNORMAL LOW (ref 80.0–100.0)
MPV: 9.8 fL (ref 9.4–12.3)
Monocytes Absolute Automated: 0.69 10*3/uL (ref 0.00–1.20)
Monocytes: 5 %
Neutrophils Absolute: 9.36 10*3/uL — ABNORMAL HIGH (ref 1.80–8.10)
Neutrophils: 74 %
Platelets: 239 10*3/uL (ref 140–400)
RBC: 4.93 10*6/uL (ref 4.20–5.40)
RDW: 15 % (ref 12–15)
WBC: 12.7 10*3/uL — ABNORMAL HIGH (ref 3.50–10.80)

## 2013-12-13 LAB — GFR: EGFR: >60

## 2013-12-13 MED ORDER — TRAMADOL HCL 50 MG PO TABS
50.0000 mg | ORAL_TABLET | Freq: Four times a day (QID) | ORAL | Status: DC | PRN
Start: 2013-12-13 — End: 2014-01-03

## 2013-12-13 MED ORDER — TRAMADOL HCL 50 MG PO TABS
50.0000 mg | ORAL_TABLET | Freq: Once | ORAL | Status: AC
Start: 2013-12-13 — End: 2013-12-13
  Administered 2013-12-13: 50 mg via ORAL
  Filled 2013-12-13: qty 1

## 2013-12-13 MED ORDER — IOHEXOL 350 MG/ML IV SOLN
100.0000 mL | Freq: Once | INTRAVENOUS | Status: AC | PRN
Start: 2013-12-13 — End: 2013-12-13
  Administered 2013-12-13: 100 mL via INTRAVENOUS

## 2013-12-13 MED ORDER — HYDROMORPHONE HCL PF 1 MG/ML IJ SOLN
0.5000 mg | Freq: Once | INTRAMUSCULAR | Status: AC
Start: 2013-12-13 — End: 2013-12-13
  Administered 2013-12-13: 0.5 mg via INTRAVENOUS
  Filled 2013-12-13: qty 1

## 2013-12-13 MED ORDER — ONDANSETRON HCL 4 MG/2ML IJ SOLN
4.0000 mg | Freq: Once | INTRAMUSCULAR | Status: AC
Start: 2013-12-13 — End: 2013-12-13
  Administered 2013-12-13: 4 mg via INTRAVENOUS
  Filled 2013-12-13: qty 2

## 2013-12-13 NOTE — Discharge Instructions (Signed)
Contusion    You have been diagnosed with a contusion.    A contusion is a bruise. A contusion occurs when something strikes or hits the body. This breaks small blood vessels called capillaries. When the capillaries break, blood leaks out. This makes the skin look red, purple, blue, or black. The injured area may hurt for a few days. If you take a blood thinner like warfarin (Coumadin) the bruising may be worse.    Apply ice to the bruise. Avoid using the injured body part.    Apply ice to help with pain and swelling. Put some ice cubes in a re-sealable plastic bag (like Ziploc). Add some water. Seal the bag. Put a thin washcloth between the bag and the skin. Apply the ice bag for at least 20 minutes. Do this at least 4 times per day. It's okay to apply ice longer or more often. NEVER APPLY ICE DIRECTLY TO THE SKIN. Always keep a washcloth between the ice pack and your body.    YOU SHOULD SEEK MEDICAL ATTENTION IMMEDIATELY, EITHER HERE OR AT THE NEAREST EMERGENCY DEPARTMENT, IF ANY OF THE FOLLOWING OCCURS:   Your pain or swelling gets much worse.   You develop new numbness or tingling in or below the affected area.   Your foot or hand looks cold or pale. This could mean there is a problem with circulation (blood supply).         Abrasion    You have been diagnosed with an abrasion. This is a scrape of the outer skin layers.    Take off old dressings every day. Then put on a clean, dry dressing. If the dressing sticks to the wound, moisten it with water. This way, it can come off more easily.    Keep the wound clean and dry for the next 24 hours. You can wash the wound gently with soap and water. Then put on a dry bandage if needed, to protect it.    Put a thin layer of antibiotic ointment on the wound 2-3 times a day. This can be Polysporin / triple antibiotic. This can help prevent infection. It may help keep scarring to a minimum.    YOU SHOULD SEEK MEDICAL ATTENTION IMMEDIATELY, EITHER HERE OR AT  THE NEAREST EMERGENCY DEPARTMENT, IF ANY OF THE FOLLOWING OCCURS:   Unusual redness or swelling.   There are red streaks going up the arm or leg.   The wound smells bad or has a lot of drainage.   Fever (temperature higher than 100.22F / 38C), chills, more pain and / or swelling.    You have been prescribed Ultram (Tramadol)    When taking this medication:    Do not Drive or operate dangerous machinery  Do not drink Alcohol  Do not take with any narcotic medications or medications that make you drowsy.
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
## 2014-01-03 ENCOUNTER — Emergency Department
Admission: EM | Admit: 2014-01-03 | Discharge: 2014-01-03 | Disposition: A | Discharge status: Home / Self Care | Payer: Self-pay | Attending: Emergency Medicine | Admitting: Emergency Medicine

## 2014-01-03 ENCOUNTER — Other Ambulatory Visit: Payer: Medicaid Other

## 2014-01-03 ENCOUNTER — Emergency Department: Payer: Medicaid Other

## 2014-01-03 ENCOUNTER — Emergency Department: Payer: Self-pay

## 2014-01-03 DIAGNOSIS — X58XXXA Exposure to other specified factors, initial encounter: Secondary | ICD-10-CM | POA: Insufficient documentation

## 2014-01-03 DIAGNOSIS — F17210 Nicotine dependence, cigarettes, uncomplicated: Secondary | ICD-10-CM | POA: Insufficient documentation

## 2014-01-03 DIAGNOSIS — R519 Headache, unspecified: Secondary | ICD-10-CM

## 2014-01-03 DIAGNOSIS — R51 Headache: Secondary | ICD-10-CM | POA: Insufficient documentation

## 2014-01-03 DIAGNOSIS — S161XXA Strain of muscle, fascia and tendon at neck level, initial encounter: Secondary | ICD-10-CM | POA: Insufficient documentation

## 2014-01-03 HISTORY — DX: Migraine, unspecified, not intractable, without status migrainosus: G43.909

## 2014-01-03 LAB — COMPREHENSIVE METABOLIC PANEL
ALT: 8 U/L (ref 0–55)
AST (SGOT): 11 U/L (ref 5–34)
Albumin/Globulin Ratio: 1.5 (ref 0.9–2.2)
Albumin: 3.7 g/dL (ref 3.5–5.0)
Alkaline Phosphatase: 60 U/L (ref 37–106)
Anion Gap: 10 (ref 5.0–15.0)
BUN: 11.3 mg/dL (ref 7.0–19.0)
Bilirubin, Total: 0.4 mg/dL (ref 0.2–1.2)
CO2: 25 mEq/L (ref 22–29)
Calcium: 8.7 mg/dL (ref 8.5–10.5)
Chloride: 110 mEq/L (ref 100–111)
Creatinine: 0.8 mg/dL (ref 0.6–1.0)
Globulin: 2.4 g/dL (ref 2.0–3.6)
Glucose: 70 mg/dL (ref 70–100)
Potassium: 4.1 mEq/L (ref 3.5–5.1)
Protein, Total: 6.1 g/dL (ref 6.0–8.3)
Sodium: 145 mEq/L (ref 136–145)

## 2014-01-03 LAB — CBC AND DIFFERENTIAL
Basophils Absolute Automated: 0.01 10*3/uL (ref 0.00–0.20)
Basophils Automated: 0 %
Eosinophils Absolute Automated: 0.19 10*3/uL (ref 0.00–0.70)
Eosinophils Automated: 3 %
Hematocrit: 38.5 % (ref 37.0–47.0)
Hgb: 12.5 g/dL (ref 12.0–16.0)
Immature Granulocytes Absolute: 0 10*3/uL
Immature Granulocytes: 0 %
Lymphocytes Absolute Automated: 2.7 10*3/uL (ref 0.50–4.40)
Lymphocytes Automated: 37 %
MCH: 25.7 pg — ABNORMAL LOW (ref 28.0–32.0)
MCHC: 32.5 g/dL (ref 32.0–36.0)
MCV: 79.2 fL — ABNORMAL LOW (ref 80.0–100.0)
MPV: 9.6 fL (ref 9.4–12.3)
Monocytes Absolute Automated: 0.49 10*3/uL (ref 0.00–1.20)
Monocytes: 7 %
Neutrophils Absolute: 3.92 10*3/uL (ref 1.80–8.10)
Neutrophils: 54 %
Platelets: 229 10*3/uL (ref 140–400)
RBC: 4.86 10*6/uL (ref 4.20–5.40)
RDW: 15 % (ref 12–15)
WBC: 7.31 10*3/uL (ref 3.50–10.80)

## 2014-01-03 LAB — HCG, SERUM, QUALITATIVE: Hcg Qualitative: NEGATIVE

## 2014-01-03 LAB — GFR: EGFR: >60

## 2014-01-03 MED ORDER — DIAZEPAM 5 MG PO TABS
5.0000 mg | ORAL_TABLET | Freq: Four times a day (QID) | ORAL | Status: DC | PRN
Start: 2014-01-03 — End: 2014-01-25

## 2014-01-03 MED ORDER — DIPHENHYDRAMINE HCL 50 MG/ML IJ SOLN
25.0000 mg | Freq: Once | INTRAMUSCULAR | Status: AC
Start: 2014-01-03 — End: 2014-01-03
  Administered 2014-01-03: 25 mg via INTRAVENOUS
  Filled 2014-01-03: qty 1

## 2014-01-03 MED ORDER — METOCLOPRAMIDE HCL 5 MG/ML IJ SOLN
10.0000 mg | Freq: Once | INTRAMUSCULAR | Status: AC
Start: 2014-01-03 — End: 2014-01-03
  Administered 2014-01-03: 10 mg via INTRAVENOUS
  Filled 2014-01-03: qty 2

## 2014-01-03 MED ORDER — SODIUM CHLORIDE 0.9 % IV BOLUS
1000.0000 mL | Freq: Once | INTRAVENOUS | Status: AC
Start: 2014-01-03 — End: 2014-01-03
  Administered 2014-01-03: 1000 mL via INTRAVENOUS

## 2014-01-03 MED ORDER — TRAMADOL HCL 50 MG PO TABS
50.0000 mg | ORAL_TABLET | Freq: Four times a day (QID) | ORAL | Status: DC | PRN
Start: 2014-01-03 — End: 2014-01-25

## 2014-01-03 MED ORDER — DIAZEPAM 5 MG/ML IJ SOLN
2.5000 mg | Freq: Once | INTRAMUSCULAR | Status: AC
Start: 2014-01-03 — End: 2014-01-03
  Administered 2014-01-03: 2.5 mg via INTRAVENOUS
  Filled 2014-01-03: qty 2
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
## 2014-01-03 NOTE — Discharge Instructions (Signed)
Back Pain, Cervical NOS     You have been seen for neck pain. This area is also called the cervical spine.     The cervical spine is between the base of the skull and the top of the shoulders.     There are many different reasons for neck pain. Some of the more common include: Bone pain, muscle strain, muscle spasm, pain from overuse, and pinched nerves.      The x-rays of your neck showed no broken bones.     The doctor still does not know the exact cause of your pain. Your problem does not seem to be from a dangerous cause. It is OK for you to go home today.     Some things you can try to help your neck feel better are:  · Apply a warm damp washcloth to the neck for 20 minutes at a time, at least 4 times per day. This will reduce your pain. Massaging your neck might also help.  · Have someone massage the sore parts of your neck.  · Don t do any heavy lifting or bending. You can go back to normal daily activities if they don t make the pain worse.  · Use the over-the-counter anti-inflammatory medication ibuprofen (also known as Advil® or Motrin®) as directed on the package to help with pain and inflammation.     It is normal for the pain to last for the next few days. If your pain gets better, you probably do not need to see a doctor. However, if your symptoms get worse or you have new symptoms, you should return here or go to the nearest Emergency Department.     Call your doctor or go to the nearest Emergency Department if you your pain does not improve or your pain is bad enough to seriously limit your normal activities.     YOU SHOULD SEEK MEDICAL ATTENTION IMMEDIATELY, EITHER HERE OR AT THE NEAREST EMERGENCY DEPARTMENT, IF ANY OF THE FOLLOWING OCCURS:  · You think the pain is coming from somewhere other than your back. This can include chest pain. This is sometimes from angina (heart pains) or other dangerous causes.  · You have shortness of breath, sweating, chest pain (or pressure, heaviness, indigestion,  etc).  · Your arms and legs tingle or get numb (lose feeling).  · Your arms or legs are weak.  · You have fever (temperature higher than 100.4ºF / 38ºC) along with headache and neck pain.  · Your neck pain is getting worse.  · You lose control of your bladder or bowels. If this were to happen, it may cause you to wet or soil yourself.  · You have problems urinating (peeing).         Headache     You have been treated for a headache.     Headaches are very common. Most of the time they are benign (not harmful). Some headaches can be very serious. Your headache appears to be benign. The doctor feels it is OK for you to go home.     If you continue to have headaches, or if this headache does not resolve over the next few days, you should be evaluated by your regular doctor or a neurologist. Keep a “headache diary.” This may help your doctor learn the cause of your headaches.     Take your headache medication as directed. This is especially important if your doctor has placed you on a daily medication to prevent headaches.     YOU SHOULD SEEK MEDICAL ATTENTION IMMEDIATELY, EITHER HERE OR   EITHER HERE OR AT THE NEAREST EMERGENCY DEPARTMENT, IF ANY OF THE FOLLOWING OCCURS:   Your headache gets worse.   You have a severe headache that occurs suddenly.   Your head pain is different from your normal headache.   You have a fever (temperature higher than 100.88F / 38C), especially with a stiff neck.   You feel numbness, tingling, or weakness in your arms or legs.   You pass out.   You have problems with your vision.   You vomit and have trouble taking medication or keeping it down.

## 2014-01-25 ENCOUNTER — Emergency Department: Payer: Self-pay

## 2014-01-25 ENCOUNTER — Emergency Department
Admission: EM | Admit: 2014-01-25 | Discharge: 2014-01-25 | Disposition: A | Discharge status: Home / Self Care | Payer: Self-pay | Attending: Emergency Medicine | Admitting: Emergency Medicine

## 2014-01-25 DIAGNOSIS — M26629 Arthralgia of temporomandibular joint, unspecified side: Secondary | ICD-10-CM

## 2014-01-25 DIAGNOSIS — F17210 Nicotine dependence, cigarettes, uncomplicated: Secondary | ICD-10-CM | POA: Insufficient documentation

## 2014-01-25 DIAGNOSIS — M2662 Arthralgia of temporomandibular joint: Secondary | ICD-10-CM | POA: Insufficient documentation

## 2014-01-25 MED ORDER — TRAMADOL-ACETAMINOPHEN 37.5-325 MG PO TABS
1.0000 | ORAL_TABLET | Freq: Four times a day (QID) | ORAL | Status: DC | PRN
Start: 2014-01-25 — End: 2014-03-27

## 2014-01-25 MED ORDER — AZITHROMYCIN 250 MG PO TABS
250.0000 mg | ORAL_TABLET | Freq: Every day | ORAL | Status: AC
Start: 2014-01-25 — End: 2014-01-31

## 2014-01-25 NOTE — Discharge Instructions (Signed)
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    This is a common condition. Estimates show that it affects up to 10 million people in the [place].    TMJ syndrome is caused by pain in and around the jaw joint. The pain may be caused by overuse (teeth clenching). It can also be caused by teeth grinding, muscle pain or injury to the joint. Jaw dislocation and joint degeneration (breakdown) are also causes.    Some TMJ symptoms are:   Dull pain in the jaw and more pain with chewing. The jaw may click or it may be hard to open the mouth.   Earache, headache and/or neck pain.   The mouth may not open all the way. The TMJ and muscles may be tender (painful).    TMJ is generally treated with pain medicine (like Advil or Motrin) and warm compresses. Narcotic pain medicines and benzodiazepines (Valium-like drugs) are sometimes used to treat more severe (serious) TMJ.   Non-prescription medicines like Advil or Motrin are usually effective. The recommended dose for ibuprofen is 600mg  every 6-8 hours (three 200mg  tablets). Ibuprofen is the active ingredient in Advil and Motrin.   Avoid stress and teeth clenching or grinding. Moist compresses (such a warm, wet washcloth) and massage of the muscles around the jaw may be helpful.    Follow-up with your family doctor, an Ear, Nose, and Throat, (ENT) doctor or oral surgeon is usually recommended.    YOU SHOULD SEEK MEDICAL ATTENTION IMMEDIATELY, EITHER HERE OR AT THE NEAREST EMERGENCY DEPARTMENT, IF ANY OF THE FOLLOWING OCCURS:   Any major change in your symptoms.   Swelling and redness to the side of your face, severe (very bad) headache, neck stiffness, fever (temperature higher than 100.68F / 38C).            Salivary Gland Stone    You have been seen for a salivary gland stone.    The parotid gland, which produces saliva (spit), is on the side of the face. It is over the cheek and jaw and goes as far back as the ear.  The gland secretes saliva (spit) into the mouth. The tube that carries it is the salivary gland duct.    Sometimes a small grain of calcium forms in the salivary gland duct. This is called a salivary gland stone. It is much like a kidney stone. A stone can block the tube and saliva (spit) cannot get from the gland to the mouth. As a result, saliva (spit) backs up into the salivary gland. This makes it swell and get painful.    Treatment often involves medicines to treat inflammation and pain. Treatment sometimes also includes antibiotics. You can use ice to help ease the pain and reduce swelling.   Putting ice on the affected area can lower swelling and pain. Put some ice cubes in a re-sealable (Ziploc) bag and add some water. Put a thin washcloth between the bag and the skin. Apply the ice bag to the area for at least 20 minutes. Do this at least 4 times per day. Longer times and more often are OK. NEVER APPLY ICE DIRECTLY TO THE SKIN.    Some stones are taken out surgically. An ear-nose-throat (ENT) specialist or oral surgeon does the procedure.    Bitter or sour foods cause saliva (spit) to flow quickly and can increase pain. However, sour foods or candy like lemon drops can help the problem. They promote infection  drainage and can dislodge (clear) a blockage.    YOU SHOULD SEEK MEDICAL ATTENTION IMMEDIATELY, EITHER HERE OR AT THE NEAREST EMERGENCY DEPARTMENT, IF ANY OF THE FOLLOWING OCCURS:   Increasing swelling along the side of the face or neck.   Your pain gets worse, even with pain medicine.   Fever (temperature higher than 100.29F / 38C) or redness on the side of the face.   Trouble swallowing.   Drooling.   Trouble breathing or a choking feeling.

## 2014-01-26 NOTE — ED Provider Notes (Signed)
Physician/Midlevel provider first contact with patient: 01/25/14 1653         History     Chief Complaint   Patient presents with   . Dental Pain     HPI Comments: 23 year old female here with right upper jaw pain.  Patient said the pain started yesterday.  She denies any injury, trauma to the area.  Pain is worse when patient eating or drinking after which she does develop a little bit of swelling to that area.  Patient denies any dental pain, trouble swallowing, trouble breathing, fever, but is complaining of sinus infection symptoms including sinus congestion, postnasal drip, purulent nasal discharge and headache.  The symptoms has been going on for 4 days.    The history is provided by the patient. No language interpreter was used.            Past Medical History   Diagnosis Date   . Childhood asthma    . Anxiety    . Depression    . Pollen allergies    . Other and unspecified ovarian cysts      left side   . HPV in female 2013     had cervical biopsies   . Postpartum depression      no meds   . Migraine        Past Surgical History   Procedure Laterality Date   . Tubes in ears     . Hernia repair         Family History   Problem Relation Age of Onset   . Drug abuse Father    . Drug abuse Maternal Aunt    . Alcohol abuse Maternal Uncle    . Depression Maternal Uncle    . Drug abuse Maternal Grandfather        Social  History   Substance Use Topics   . Smoking status: Current Every Day Smoker -- 0.50 packs/day for 15 years     Types: Cigarettes   . Smokeless tobacco: Never Used      Comment: trying to quit, has been smoking since age 65 up to 1 ppd   . Alcohol Use: No       .     Allergies   Allergen Reactions   . Amoxicillin Anaphylaxis   . Ceclor [Cefaclor] Anaphylaxis and Rash   . Macrodantin Anaphylaxis   . Penicillins Anaphylaxis   . Chocolate Nausea And Vomiting   . Flagyl [Metronidazole] Itching   . Sulfa Antibiotics Itching   . Vicodin [Hydrocodone-Acetaminophen] Hives     Passed out     . Clindamycin  Rash   . Latex Rash       Discharge Medication List as of 01/25/2014  5:10 PM           Review of Systems   Constitutional: Negative.    HENT: Negative for facial swelling, trouble swallowing and voice change.         Jaw pain.   All other systems reviewed and are negative.      Physical Exam    BP: 117/69 mmHg, Heart Rate: 76, Temp: 98.4 F (36.9 C), Resp Rate: 16, SpO2: 98 %, Weight: 95.255 kg    Physical Exam   Constitutional: She is oriented to person, place, and time. She appears well-developed and well-nourished.   HENT:   Head: Normocephalic and atraumatic.       Right Ear: External ear normal.   Left Ear: External ear normal.  Nose: Nose normal.   Mouth/Throat: Oropharynx is clear and moist. No oropharyngeal exudate.   Patient with pain over the temporomandibular joint.  No crepitus or bony abnormalities noted over the temporomandibular joint.  No lymphadenopathy noted.  No trismus, no signs of peritonsillar abscess.   Eyes: Conjunctivae and EOM are normal. Pupils are equal, round, and reactive to light.   Neck: Normal range of motion.   Pulmonary/Chest: Effort normal and breath sounds normal.   Lymphadenopathy:     She has no cervical adenopathy.   Neurological: She is alert and oriented to person, place, and time.   Skin: Skin is warm and dry.   Psychiatric: She has a normal mood and affect. Her behavior is normal. Judgment and thought content normal.   Nursing note and vitals reviewed.      MDM and ED Course     ED Medication Orders     None              MDM  Number of Diagnoses or Management Options  Temporomandibular joint (TMJ) pain:   Diagnosis management comments: The attending signature signifies review and agreement of the history , PE, evaluation, clinical impression and discharge plan except as otherwise noted.    I, Nellene Courtois Albertine Grates, have been the primary provider for Catherine Spence during this Emergency Dept visit.    I reviewed nursing recorded vitals and history including PMSFHX    Oxygen  saturation by pulse oximetry is 95%-100%, Normal.  Interventions: None Needed.    DDX to include, but not limited to:  Temporomandibular joint dysfunction, salivary duct stone, dental abscess, dental caries  Plan:  Patient with either temporomandibular joint dysfunction or salivary duct stone.  Patient will referred to oral surgeon for further evaluation.  Regards to this.  Patient also given analgesics.  Antibiotic prescribed.  4.  Patient sinus infection.  However, she was advised that if her symptoms persist into next week, that she should take the medicine at this time I would not recommend taking it.         Amount and/or Complexity of Data Reviewed  Review and summarize past medical records: yes    Risk of Complications, Morbidity, and/or Mortality  Presenting problems: minimal  Diagnostic procedures: minimal  Management options: minimal    Patient Progress  Patient progress: stable         Procedures    Clinical Impression & Disposition     Clinical Impression  Final diagnoses:   Temporomandibular joint (TMJ) pain        ED Disposition     Discharge Catherine Spence discharge to home/self care.    Condition at disposition: Stable             Discharge Medication List as of 01/25/2014  5:10 PM      START taking these medications    Details   azithromycin (ZITHROMAX Z-PAK) 250 MG tablet Take 1 tablet (250 mg total) by mouth daily. 2 tablets on day one, then 1 tablet on days 2 through 5, Starting 01/25/2014, Until Fri 01/31/14, Print      traMADol-acetaminophen (ULTRACET) 37.5-325 MG per tablet Take 1 tablet by mouth every 6 (six) hours as needed for Pain., Starting 01/25/2014, Until Discontinued, Print                       Carney Harder [place], [place]  01/26/14 2036

## 2014-02-22 ENCOUNTER — Emergency Department: Payer: Medicaid Other

## 2014-02-22 ENCOUNTER — Other Ambulatory Visit: Payer: Medicaid Other

## 2014-02-22 ENCOUNTER — Emergency Department
Admission: EM | Admit: 2014-02-22 | Discharge: 2014-02-22 | Disposition: A | Discharge status: Home / Self Care | Payer: Self-pay | Attending: Emergency Medicine | Admitting: Emergency Medicine

## 2014-02-22 ENCOUNTER — Emergency Department: Payer: Self-pay

## 2014-02-22 DIAGNOSIS — F17210 Nicotine dependence, cigarettes, uncomplicated: Secondary | ICD-10-CM | POA: Insufficient documentation

## 2014-02-22 DIAGNOSIS — J209 Acute bronchitis, unspecified: Secondary | ICD-10-CM | POA: Insufficient documentation

## 2014-02-22 LAB — CBC AND DIFFERENTIAL
Basophils Absolute Automated: 0.01 10*3/uL (ref 0.00–0.20)
Basophils Automated: 0 %
Eosinophils Absolute Automated: 0.37 10*3/uL (ref 0.00–0.70)
Eosinophils Automated: 3 %
Hematocrit: 38.9 % (ref 37.0–47.0)
Hgb: 12.6 g/dL (ref 12.0–16.0)
Immature Granulocytes Absolute: 0.03 10*3/uL
Immature Granulocytes: 0 %
Lymphocytes Absolute Automated: 4.74 10*3/uL — ABNORMAL HIGH (ref 0.50–4.40)
Lymphocytes Automated: 41 %
MCH: 25.9 pg — ABNORMAL LOW (ref 28.0–32.0)
MCHC: 32.4 g/dL (ref 32.0–36.0)
MCV: 79.9 fL — ABNORMAL LOW (ref 80.0–100.0)
MPV: 9.5 fL (ref 9.4–12.3)
Monocytes Absolute Automated: 0.65 10*3/uL (ref 0.00–1.20)
Monocytes: 6 %
Neutrophils Absolute: 5.69 10*3/uL (ref 1.80–8.10)
Neutrophils: 50 %
Platelets: 248 10*3/uL (ref 140–400)
RBC: 4.87 10*6/uL (ref 4.20–5.40)
RDW: 15 % (ref 12–15)
WBC: 11.46 10*3/uL — ABNORMAL HIGH (ref 3.50–10.80)

## 2014-02-22 LAB — COMPREHENSIVE METABOLIC PANEL
ALT: 11 U/L (ref 0–55)
AST (SGOT): 14 U/L (ref 5–34)
Albumin/Globulin Ratio: 1.4 (ref 0.9–2.2)
Albumin: 3.6 g/dL (ref 3.5–5.0)
Alkaline Phosphatase: 61 U/L (ref 37–106)
Anion Gap: 11 (ref 5.0–15.0)
BUN: 11.9 mg/dL (ref 7.0–19.0)
Bilirubin, Total: 0.2 mg/dL (ref 0.2–1.2)
CO2: 20 mEq/L — ABNORMAL LOW (ref 22–29)
Calcium: 8.4 mg/dL — ABNORMAL LOW (ref 8.5–10.5)
Chloride: 109 mEq/L (ref 100–111)
Creatinine: 0.8 mg/dL (ref 0.6–1.0)
Globulin: 2.5 g/dL (ref 2.0–3.6)
Glucose: 97 mg/dL (ref 70–100)
Potassium: 3.8 mEq/L (ref 3.5–5.1)
Protein, Total: 6.1 g/dL (ref 6.0–8.3)
Sodium: 140 mEq/L (ref 136–145)

## 2014-02-22 LAB — LIPASE: Lipase: 44 U/L (ref 8–78)

## 2014-02-22 LAB — HCG, SERUM, QUALITATIVE: Hcg Qualitative: NEGATIVE

## 2014-02-22 LAB — IHS D-DIMER: D-Dimer: 0.6 ug/mL FEU — ABNORMAL HIGH (ref 0.00–0.51)

## 2014-02-22 LAB — GFR: EGFR: >60

## 2014-02-22 MED ORDER — ALBUTEROL SULFATE HFA 108 (90 BASE) MCG/ACT IN AERS
2.0000 | INHALATION_SPRAY | RESPIRATORY_TRACT | Status: DC | PRN
Start: 2014-02-22 — End: 2014-03-24

## 2014-02-22 MED ORDER — PREDNISONE 20 MG PO TABS
60.0000 mg | ORAL_TABLET | Freq: Once | ORAL | Status: AC
Start: 2014-02-22 — End: 2014-02-22
  Administered 2014-02-22: 60 mg via ORAL
  Filled 2014-02-22: qty 3

## 2014-02-22 MED ORDER — IOHEXOL 350 MG/ML IV SOLN
100.0000 mL | Freq: Once | INTRAVENOUS | Status: AC | PRN
Start: 2014-02-22 — End: 2014-02-22
  Administered 2014-02-22: 100 mL via INTRAVENOUS

## 2014-02-22 MED ORDER — ALBUTEROL SULFATE HFA 108 (90 BASE) MCG/ACT IN AERS
2.0000 | INHALATION_SPRAY | Freq: Once | RESPIRATORY_TRACT | Status: AC
Start: 2014-02-22 — End: 2014-02-22
  Administered 2014-02-22: 2 via RESPIRATORY_TRACT
  Filled 2014-02-22: qty 1

## 2014-02-22 MED ORDER — PREDNISONE 20 MG PO TABS
60.0000 mg | ORAL_TABLET | Freq: Every day | ORAL | Status: AC
Start: 2014-02-22 — End: 2014-02-26

## 2014-02-22 MED ORDER — ALBUTEROL SULFATE (2.5 MG/3ML) 0.083% IN NEBU
5.0000 mg | INHALATION_SOLUTION | Freq: Once | RESPIRATORY_TRACT | Status: AC
Start: 2014-02-22 — End: 2014-02-22
  Administered 2014-02-22: 5 mg via RESPIRATORY_TRACT
  Filled 2014-02-22: qty 6

## 2014-02-22 NOTE — ED Notes (Signed)
Patient comes in from home, states that she thinks she has bronchitis or pneumonia, has been having difficulty breathing for 2 days, felt chills, pain in her back when taking deep breath, states that she has been having a dry cough for 2 days.

## 2014-02-22 NOTE — Discharge Instructions (Signed)
Return to ER immediately if worse in any way or for any other concern.     Bronchitis    You have been diagnosed with bronchitis.    Bronchitis is an irritation of the large breathing tubes. It can be caused by tobacco smoke, air pollution, or an infection. Patients with bronchitis are short of breath and may cough up green or yellow mucous. These symptoms are usually worse at night, when lying flat and in wet weather. Most people with bronchitis do not need antibiotics. If your doctor prescribes antibiotics, fill the prescription and take all the medicine according to the instructions.    Bronchitis is usually treated with medicine to help stop coughing. An inhaler with albuterol (Ventolin/Proventil) is sometimes used to help with cough. It is best to use the inhaler with a spacer to help the medicine reach your lungs. The doctor can prescribe a spacer.    Bronchitis is usually caused by a virus. Antibiotics do not kill viruses. In fact, antibiotics do not affect viruses in any way. In the past, some doctors prescribed antibiotics for people with bronchitis. We now know that antibiotics are not helpful for most bronchitis patients. Patients who might need antibiotics are those with lung problems that don't go away, like emphysema or COPD.    Your coughing and wheezing might last for 2 or 3 weeks! The symptoms should get better over this time period and not worse.    Your doctor prescribed an albuterol (Ventolin/Proventil) inhaler. Use it every four hours if you are wheezing, coughing, or short of breath. This will reduce symptoms.    Do not smoke. Research shows that smoking causes heart disease, cancer, and birth defects. Avoiding smoking will help your asthma. If you smoke, ask your doctor for ideas about how to stop.   If you do not smoke, avoid others who do.    YOU SHOULD SEEK MEDICAL ATTENTION IMMEDIATELY, EITHER HERE OR AT THE NEAREST EMERGENCY DEPARTMENT, IF ANY OF THE FOLLOWING OCCURS:   You  wheeze or have trouble breathing.   You have a fever (temperature higher than 100.4F / 38C), that won't go away.   You have chest pain.   You vomit or cannot keep liquids down or you feel weak or dizzy.   Your symptoms get worse over the next 2 or 3 days.

## 2014-02-23 LAB — ECG 12-LEAD
Atrial Rate: 75 {beats}/min
P Axis: 43 degrees
P-R Interval: 172 ms
Q-T Interval: 410 ms
QRS Duration: 98 ms
QTC Calculation (Bezet): 457 ms
R Axis: 5 degrees
T Axis: 27 degrees
Ventricular Rate: 75 {beats}/min

## 2014-02-26 NOTE — ED Provider Notes (Signed)
Physician/Midlevel provider first contact with patient: 02/22/14 2103         History     Chief Complaint   Patient presents with   . Shortness of Breath     Patient comes in from home, states that she thinks she has bronchitis or pneumonia, has been having difficulty breathing for 2 days, felt chills, pain in her back when taking deep breath, states that she has been having a dry cough for 2 days.      The history is provided by the patient.   Timing: 2 days  SOB  Associated with back pain, nasal congestion, sore throat  Location: thoracic  Exacerbating: deep breathing  Modifying: family h/o DVT/PE. No leg pain or swelling. Not on OCP. No travel. + h/o neb use in past. Not a smoker  Severity: moderate  Alleviating: multiple cough and cold symptom no change    Past Medical History   Diagnosis Date   . Childhood asthma    . Anxiety    . Depression    . Pollen allergies    . Other and unspecified ovarian cysts      left side   . HPV in female 2013     had cervical biopsies   . Postpartum depression      no meds   . Migraine        Past Surgical History   Procedure Laterality Date   . Tubes in ears     . Hernia repair         Family History   Problem Relation Age of Onset   . Drug abuse Father    . Drug abuse Maternal Aunt    . Alcohol abuse Maternal Uncle    . Depression Maternal Uncle    . Drug abuse Maternal Grandfather        Social  History   Substance Use Topics   . Smoking status: Current Every Day Smoker -- 0.50 packs/day for 15 years     Types: Cigarettes   . Smokeless tobacco: Never Used      Comment: trying to quit, has been smoking since age 49 up to 1 ppd   . Alcohol Use: No       .     Allergies   Allergen Reactions   . Amoxicillin Anaphylaxis   . Ceclor [Cefaclor] Anaphylaxis and Rash   . Macrodantin Anaphylaxis   . Penicillins Anaphylaxis   . Chocolate Nausea And Vomiting   . Flagyl [Metronidazole] Itching   . Sulfa Antibiotics Itching   . Vicodin [Hydrocodone-Acetaminophen] Hives     Passed out     .  Clindamycin Rash   . Latex Rash       Discharge Medication List as of 02/22/2014 11:18 PM      CONTINUE these medications which have NOT CHANGED    Details   traMADol-acetaminophen (ULTRACET) 37.5-325 MG per tablet Take 1 tablet by mouth every 6 (six) hours as needed for Pain., Starting 01/25/2014, Until Discontinued, Print              Review of Systems   Constitutional: Negative for fever.   HENT: Positive for congestion, rhinorrhea and sore throat.    Eyes: Negative for redness.   Respiratory: Positive for cough, shortness of breath and wheezing.    Cardiovascular: Positive for chest pain. Negative for leg swelling.   Gastrointestinal: Negative for vomiting, abdominal pain and diarrhea.   Genitourinary: Negative for dysuria and hematuria.  Musculoskeletal: Positive for back pain.   Skin: Negative for rash.   Neurological: Negative for syncope and headaches.   Hematological: Does not bruise/bleed easily.       Physical Exam    BP: 109/58 mmHg, Heart Rate: 81, Temp: 97.9 F (36.6 C), Resp Rate: 14, SpO2: 98 %, Weight: 81.647 kg    Physical Exam   Constitutional: She is oriented to person, place, and time. She appears well-developed. No distress.   HENT:   Nose: Nose normal.   Mouth/Throat: Oropharynx is clear and moist.   Eyes: Conjunctivae are normal. Pupils are equal, round, and reactive to light.   Neck: Neck supple.   Cardiovascular: Normal rate and regular rhythm.    Pulmonary/Chest: Effort normal. No respiratory distress. She has wheezes. She has no rales. She exhibits no tenderness.   Abdominal: Soft. There is no tenderness.   Lymphadenopathy:     She has no cervical adenopathy.   Neurological: She is alert and oriented to person, place, and time.   Gross motor intact     Skin: Skin is warm. No rash noted.   Psychiatric: She has a normal mood and affect. Thought content normal.   Nursing note and vitals reviewed.        MDM and ED Course     ED Medication Orders     Start     Status Ordering Provider     02/22/14 2318  albuterol (PROVENTIL HFA;VENTOLIN HFA) inhaler 2 puff   RT - Once     Comments:  pls disp remainder to pt   Route: Inhalation  Ordered Dose: 2 puff     Last MAR action:  Given Burnetta Sabin    02/22/14 2318  predniSONE (DELTASONE) tablet 60 mg   Once     Route: Oral  Ordered Dose: 60 mg     Last MAR action:  Given Burnetta Sabin    02/22/14 2111  albuterol (PROVENTIL) nebulizer solution 5 mg   RT - Once     Route: Nebulization  Ordered Dose: 5 mg     Last MAR action:  Given Hiroyuki Ozanich, Mordecai Rasmussen              MDM  Number of Diagnoses or Management Options  Bronchitis, acute, with bronchospasm:   Diagnosis management comments: Keturah Barre MD, have been the primary provider for Micheline Maze during this Emergency Dept visit.    Dx bronchitis with bronchospasm likely viral given time course. Steroid, inhaler. F/u PMD RTER precautions. D/w pt results, care plan, return instructions; pt demonstrates understanding and agrees with plan. Ddx incl but not limited to: doubt PNA, PE.        Amount and/or Complexity of Data Reviewed  Clinical lab tests: ordered and reviewed  Tests in the radiology section of CPT: ordered and reviewed           Procedures    Clinical Impression & Disposition     Clinical Impression  Final diagnoses:   Bronchitis, acute, with bronchospasm        ED Disposition     Discharge Micheline Maze discharge to home/self care.    Condition at disposition: Stable             Discharge Medication List as of 02/22/2014 11:18 PM      START taking these medications    Details   albuterol (PROVENTIL HFA;VENTOLIN HFA) 108 (90 BASE) MCG/ACT inhaler Inhale 2 puffs into the  lungs every 4 (four) hours as needed for Wheezing or Shortness of Breath (coughing). Dispense with spacer, Starting 02/22/2014, Until Mon 03/24/14, Print      predniSONE (DELTASONE) 20 MG tablet Take 3 tablets (60 mg total) by mouth daily., Starting 02/22/2014, Until Wed 02/26/14, Print                          Livingston Denner, Mordecai Rasmussen, MD  02/26/14 [phone]

## 2014-03-11 ENCOUNTER — Emergency Department: Payer: Self-pay

## 2014-03-11 ENCOUNTER — Emergency Department
Admission: EM | Admit: 2014-03-11 | Discharge: 2014-03-11 | Disposition: A | Discharge status: Home / Self Care | Payer: Self-pay | Attending: Emergency Medicine | Admitting: Emergency Medicine

## 2014-03-11 DIAGNOSIS — J40 Bronchitis, not specified as acute or chronic: Secondary | ICD-10-CM | POA: Insufficient documentation

## 2014-03-11 DIAGNOSIS — R059 Cough, unspecified: Secondary | ICD-10-CM

## 2014-03-11 DIAGNOSIS — F17210 Nicotine dependence, cigarettes, uncomplicated: Secondary | ICD-10-CM | POA: Insufficient documentation

## 2014-03-11 DIAGNOSIS — J0110 Acute frontal sinusitis, unspecified: Secondary | ICD-10-CM | POA: Insufficient documentation

## 2014-03-11 DIAGNOSIS — J45909 Unspecified asthma, uncomplicated: Secondary | ICD-10-CM | POA: Insufficient documentation

## 2014-03-11 LAB — URINE HCG QUALITATIVE: Urine HCG Qualitative: NEGATIVE

## 2014-03-11 MED ORDER — LEVOFLOXACIN 500 MG PO TABS
500.0000 mg | ORAL_TABLET | Freq: Every day | ORAL | Status: AC
Start: 2014-03-11 — End: 2014-03-16

## 2014-03-11 NOTE — ED Notes (Addendum)
Pt recently seen here at [facility] 11/21, dx with bronchitis, pt has had SOB since, patient states SOB worsened last night "I could not breathe", pt also CO increased thick green/grey colored mucous production with cough. Pt states no relief with SOB with inhaler at home. Pt unlabored breathing on room air, VSS. Patient denies CP/N/V/fevers/chills.

## 2014-03-11 NOTE — ED Provider Notes (Signed)
I, Elyn Aquas, D.O., have personally seen and examined this patient, and have fully participated in the patient's care.  I agree with all pertinent and available clinical information, including history, physical examination, clinical impression, assessment, and plan as documented by the [facility] except as noted.    Patient with upper respiratory infection, now complaining of productive sputum and green nasal drainage recently had a negative CT for PE, was given steroids and albuterol with continued symptoms.  Patient placed on Levaquin.    Terance Ice, DO  03/11/14 1027

## 2014-03-11 NOTE — ED Provider Notes (Signed)
Physician/Midlevel provider first contact with patient: 03/11/14 9147         History     Chief Complaint   Patient presents with   . Shortness of Breath   . Sinus Pain     HPI Comments: 23 year old female here for evaluation of upper respiratory infection and sinus pain.  She was evaluated November 21, diagnosed with bronchitis.  She had a CTa can that was negative for PE.  She was sent home with steroids and albuterol.  Patient states she has been no improvement in her cough or shortness of breath.  Patient's having a productive cough with thick green, gray colored mucus and states she still cannot breathe.  Patient also complaining of sinus pain, postnasal drip and sinus congestion.  Patient denies any fever, chest pain, nausea, vomiting, or any other symptoms.  Patient states she does use her albuterol nebulizer and inhaler home with minimal relief.    The history is provided by the patient. No language interpreter was used.            Past Medical History   Diagnosis Date   . Childhood asthma    . Anxiety    . Depression    . Pollen allergies    . Other and unspecified ovarian cysts      left side   . HPV in female 2013     had cervical biopsies   . Postpartum depression      no meds   . Migraine        Past Surgical History   Procedure Laterality Date   . Tubes in ears         Family History   Problem Relation Age of Onset   . Drug abuse Father    . Drug abuse Maternal Aunt    . Alcohol abuse Maternal Uncle    . Depression Maternal Uncle    . Drug abuse Maternal Grandfather        Social  History   Substance Use Topics   . Smoking status: Current Every Day Smoker -- 0.50 packs/day for 15 years     Types: Cigarettes   . Smokeless tobacco: Never Used      Comment: trying to quit, has been smoking since age 81 up to 1 ppd   . Alcohol Use: No       .     Allergies   Allergen Reactions   . Amoxicillin Anaphylaxis   . Ceclor [Cefaclor] Anaphylaxis and Rash   . Macrodantin Anaphylaxis   . Penicillins Anaphylaxis   .  Chocolate Nausea And Vomiting   . Flagyl [Metronidazole] Itching   . Sulfa Antibiotics Itching   . Vicodin [Hydrocodone-Acetaminophen] Hives     Passed out     . Clindamycin Rash   . Latex Rash       Current/Home Medications    ALBUTEROL (PROVENTIL HFA;VENTOLIN HFA) 108 (90 BASE) MCG/ACT INHALER    Inhale 2 puffs into the lungs every 4 (four) hours as needed for Wheezing or Shortness of Breath (coughing). Dispense with spacer    TRAMADOL-ACETAMINOPHEN (ULTRACET) 37.5-325 MG PER TABLET    Take 1 tablet by mouth every 6 (six) hours as needed for Pain.        Review of Systems   Constitutional: Negative.  Negative for fever.   HENT: Positive for congestion, postnasal drip and rhinorrhea.    Respiratory: Positive for cough and shortness of breath.    Cardiovascular: Negative.  Gastrointestinal: Negative.    Genitourinary: Negative.    Musculoskeletal: Negative.    Skin: Negative.    Hematological: Negative.    Psychiatric/Behavioral: Negative.    All other systems reviewed and are negative.      Physical Exam    BP: 122/70 mmHg, Heart Rate: 77, Temp: 97.8 F (36.6 C), Resp Rate: 20, SpO2: 98 %, Weight: 86.183 kg    Physical Exam   Constitutional: She is oriented to person, place, and time. She appears well-developed and well-nourished.   HENT:   Head: Normocephalic and atraumatic.   Right Ear: External ear normal.   Left Ear: External ear normal.   Mouth/Throat: Oropharynx is clear and moist.   Neck: Normal range of motion.   Cardiovascular: Normal rate, regular rhythm, normal heart sounds and intact distal pulses.    Pulmonary/Chest: Effort normal and breath sounds normal. No respiratory distress. She has no wheezes. She has no rales.   Musculoskeletal: Normal range of motion.   Lymphadenopathy:     She has no cervical adenopathy.   Neurological: She is alert and oriented to person, place, and time.   Skin: Skin is warm and dry.   Psychiatric: She has a normal mood and affect. Her behavior is normal. Judgment and  thought content normal.   Nursing note and vitals reviewed.        MDM and ED Course     ED Medication Orders     None              MDM  Number of Diagnoses or Management Options  Acute frontal sinusitis, recurrence not specified:   Bronchitis:   Cough:   Diagnosis management comments: The attending signature signifies review and agreement of the history , PE, evaluation, clinical impression and discharge plan except as otherwise noted.    I, Mersades Barbaro Albertine Grates, have been the primary provider for Catherine Spence during this Emergency Dept visit.    I reviewed nursing recorded vitals and history including PMSFHX    Oxygen saturation by pulse oximetry is 95%-100%, Normal.  Interventions: None Needed.    DDX to include, but not limited to:  Bronchitis, pneumonia, sinusitis  Plan:  Patient is clinically well appearing, no respiratory distress.  Vital signs were unremarkable.  At this point, we will start patient on antibiotics.  Since she has a recent CT scan and x-ray will not change our treatment plan no imaging study will be done.  Patient advised to follow-up family doctor in 2 days for reevaluation.         Amount and/or Complexity of Data Reviewed  Clinical lab tests: ordered and reviewed  Review and summarize past medical records: yes    Risk of Complications, Morbidity, and/or Mortality  Presenting problems: minimal  Diagnostic procedures: minimal  Management options: minimal    Patient Progress  Patient progress: stable         Procedures    Results     Procedure Component Value Units Date/Time    Urine HCG, Qualitative [[id number]] Collected:  03/11/14 0948    Specimen Information:  Urine Updated:  03/11/14 1005     Urine HCG Qualitative Negative           Radiology Results (24 Hour)     ** No results found for the last 24 hours. **          I have reviewed all labs and/or radiological studies. I have reviewed all xrays if any myself on the  PACS system.    Clinical Impression & Disposition     Clinical  Impression  Final diagnoses:   Bronchitis   Acute frontal sinusitis, recurrence not specified   Cough        ED Disposition     Discharge Catherine Spence discharge to home/self care.    Condition at disposition: Stable             New Prescriptions    LEVOFLOXACIN (LEVAQUIN) 500 MG TABLET    Take 1 tablet (500 mg total) by mouth daily.                   Carney Harder [place], [place]  03/11/14 1020

## 2014-03-11 NOTE — Discharge Instructions (Signed)
Cough     You have been seen for your cough.     There are many possible causes of cough. Most are not dangerous. Your doctor has determined that it is OK for you to go home today.     Your doctor believes your cough was caused by bacteria. Your doctor prescribed an antibiotic that will fight the bacteria.     The doctor may have prescribed some medicine to help with your cough. Use the medicine as directed.     YOU SHOULD SEEK MEDICAL ATTENTION IMMEDIATELY, EITHER HERE OR AT THE NEAREST EMERGENCY DEPARTMENT, IF ANY OF THE FOLLOWING OCCURS:  · You wheeze or have trouble breathing.  · You cough up mucous or lose weight for no reason.  · You have a fever (temperature higher than 100.4ºF / 38ºC) that lasts more than 5 days.  · You have chest pain.  · Your symptoms get worse or do not get better in 2 or 3 days.  · You have any new problems or concerns.             Sinusitis     You have been seen for a sinus infection.     Sinus infections are common. They often happen after people have a virus or a common cold.     Symptoms are: Pain in the face, green or yellow mucous from the nose, fever (temperature higher than 100.4ºF / 38ºC) and chills. There may also be a feeling of fullness or pressure in the face.     Decongestants may be used to help the sinuses drain. Sometimes a steroid spray is used.     You may need antibiotics for the infection. Not all cases of sinusitis need antibiotics. Viruses cause most sinusitis. Antibiotics do not work on viruses. Sometimes sinusitis and be caused by bacteria. These cases usually get better with medicine to help the sinuses drain. Antibiotics should be given only to patients who have symptoms for more than 2 weeks and if they have fever, severe sinus pain and pus mixed in with the mucus.     YOU SHOULD SEEK MEDICAL ATTENTION IMMEDIATELY, EITHER HERE OR AT THE NEAREST EMERGENCY DEPARTMENT, IF ANY OF THE FOLLOWING OCCURS:  · You do not get better with treatment.  · You have severe  headaches and high fever (temperature higher than 100.4ºF / 38ºC) or any confusion.  · You have worse pain in the face. Your face gets more swollen.

## 2014-03-27 ENCOUNTER — Emergency Department: Payer: Self-pay

## 2014-03-27 ENCOUNTER — Emergency Department
Admission: EM | Admit: 2014-03-27 | Discharge: 2014-03-27 | Disposition: A | Discharge status: Home / Self Care | Payer: Self-pay | Attending: Emergency Medicine | Admitting: Emergency Medicine

## 2014-03-27 DIAGNOSIS — R51 Headache: Secondary | ICD-10-CM | POA: Insufficient documentation

## 2014-03-27 DIAGNOSIS — R05 Cough: Secondary | ICD-10-CM | POA: Insufficient documentation

## 2014-03-27 DIAGNOSIS — R519 Headache, unspecified: Secondary | ICD-10-CM

## 2014-03-27 DIAGNOSIS — F439 Reaction to severe stress, unspecified: Secondary | ICD-10-CM | POA: Insufficient documentation

## 2014-03-27 DIAGNOSIS — F17210 Nicotine dependence, cigarettes, uncomplicated: Secondary | ICD-10-CM | POA: Insufficient documentation

## 2014-03-27 DIAGNOSIS — J0100 Acute maxillary sinusitis, unspecified: Secondary | ICD-10-CM | POA: Insufficient documentation

## 2014-03-27 DIAGNOSIS — R059 Cough, unspecified: Secondary | ICD-10-CM

## 2014-03-27 DIAGNOSIS — H5712 Ocular pain, left eye: Secondary | ICD-10-CM | POA: Insufficient documentation

## 2014-03-27 DIAGNOSIS — F430 Acute stress reaction: Secondary | ICD-10-CM

## 2014-03-27 DIAGNOSIS — J019 Acute sinusitis, unspecified: Secondary | ICD-10-CM

## 2014-03-27 LAB — URINALYSIS, REFLEX TO MICROSCOPIC EXAM IF INDICATED
Bilirubin, UA: NEGATIVE
Blood, UA: NEGATIVE
Glucose, UA: NEGATIVE
Ketones UA: NEGATIVE
Nitrite, UA: NEGATIVE
Protein, UR: NEGATIVE
Specific Gravity UA: 1.021 (ref 1.001–1.035)
Urine pH: 5 (ref 5.0–8.0)
Urobilinogen, UA: NORMAL mg/dL

## 2014-03-27 LAB — URINE HCG QUALITATIVE: Urine HCG Qualitative: NEGATIVE

## 2014-03-27 MED ORDER — ONDANSETRON 4 MG PO TBDP
4.0000 mg | ORAL_TABLET | Freq: Once | ORAL | Status: AC
Start: 2014-03-27 — End: 2014-03-27
  Administered 2014-03-27: 4 mg via ORAL
  Filled 2014-03-27: qty 1

## 2014-03-27 MED ORDER — HYDROMORPHONE HCL 2 MG PO TABS
2.0000 mg | ORAL_TABLET | Freq: Four times a day (QID) | ORAL | Status: DC | PRN
Start: 2014-03-27 — End: 2014-05-05

## 2014-03-27 MED ORDER — HYDROMORPHONE HCL 2 MG PO TABS
2.0000 mg | ORAL_TABLET | Freq: Once | ORAL | Status: AC
Start: 2014-03-27 — End: 2014-03-27
  Administered 2014-03-27: 2 mg via ORAL
  Filled 2014-03-27: qty 1

## 2014-03-27 MED ORDER — AZITHROMYCIN 250 MG PO TABS
250.0000 mg | ORAL_TABLET | Freq: Every day | ORAL | Status: AC
Start: 2014-03-27 — End: 2014-03-31

## 2014-03-27 MED ORDER — OXYCODONE-ACETAMINOPHEN 5-325 MG PO TABS
2.0000 | ORAL_TABLET | Freq: Once | ORAL | Status: DC
Start: 2014-03-27 — End: 2014-03-28
  Filled 2014-03-27: qty 2

## 2014-03-27 MED ORDER — AZITHROMYCIN 250 MG PO TABS
500.0000 mg | ORAL_TABLET | Freq: Once | ORAL | Status: AC
Start: 2014-03-27 — End: 2014-03-27
  Administered 2014-03-27: 500 mg via ORAL
  Filled 2014-03-27: qty 2

## 2014-03-27 MED ORDER — ONDANSETRON 4 MG PO TBDP
4.0000 mg | ORAL_TABLET | Freq: Four times a day (QID) | ORAL | Status: DC | PRN
Start: 2014-03-27 — End: 2014-05-05

## 2014-03-27 NOTE — ED Provider Notes (Signed)
Physician/Midlevel provider first contact with patient: 03/27/14 2113         History     Chief Complaint   Patient presents with   . Headache   . Eye Problem     HPI Comments: Pt screaming when started to get left frontal ha with hx of similar and new left eye pressure with initial mild blurred vision that is better now- no glasses or contacts use- also with two weeks of urinary freq s/p rx for uti and nasal cong with cough that zpack usually helps- no fever- no other sxs  No trauma    Patient is a 23 y.o. female presenting with headaches. The history is provided by the patient.   Headache  Pain location:  Frontal (left frontal)  Quality:  Dull  Radiates to:  Does not radiate  Pain severity now: moderate.  Onset quality:  Sudden  Duration:  1 hour  Timing:  Constant  Progression:  Unchanged  Chronicity:  Recurrent  Similar to prior headaches: yes    Context: emotional stress    Relieved by:  Nothing  Worsened by:  Nothing tried  Ineffective treatments:  None tried  Associated symptoms: congestion, cough and eye pain    Associated symptoms: no abdominal pain, no back pain, no diarrhea, no fever, no nausea, no neck pain, no sore throat and no vomiting    Associated symptoms comment:  Left eye pain           Past Medical History   Diagnosis Date   . Childhood asthma    . Anxiety    . Depression    . Pollen allergies    . Other and unspecified ovarian cysts      left side   . HPV in female 2013     had cervical biopsies   . Postpartum depression      no meds   . Migraine        Past Surgical History   Procedure Laterality Date   . Tubes in ears         Family History   Problem Relation Age of Onset   . Drug abuse Father    . Drug abuse Maternal Aunt    . Alcohol abuse Maternal Uncle    . Depression Maternal Uncle    . Drug abuse Maternal Grandfather        Social  History   Substance Use Topics   . Smoking status: Current Every Day Smoker -- 0.50 packs/day for 15 years     Types: Cigarettes   . Smokeless tobacco:  Never Used      Comment: trying to quit, has been smoking since age 29 up to 1 ppd   . Alcohol Use: No       .     Allergies   Allergen Reactions   . Amoxicillin Anaphylaxis   . Ceclor [Cefaclor] Anaphylaxis and Rash   . Macrodantin Anaphylaxis   . Penicillins Anaphylaxis   . Chocolate Nausea And Vomiting   . Flagyl [Metronidazole] Itching   . Percocet [Oxycodone-Acetaminophen] Nausea And Vomiting   . Sulfa Antibiotics Itching   . Vicodin [Hydrocodone-Acetaminophen] Hives     Passed out     . Clindamycin Rash   . Latex Rash       Current/Home Medications    No medications on file        Review of Systems   Constitutional: Negative for fever and activity change.   HENT:  Positive for congestion. Negative for rhinorrhea and sore throat.    Eyes: Positive for pain, redness and visual disturbance.   Respiratory: Positive for cough. Negative for shortness of breath.    Cardiovascular: Negative for chest pain.   Gastrointestinal: Negative for nausea, vomiting, abdominal pain, diarrhea and constipation.   Genitourinary: Positive for frequency. Negative for dysuria.   Musculoskeletal: Negative for back pain and neck pain.        No trauma   Skin: Negative for color change and rash.   Neurological: Positive for headaches. Negative for syncope.   All other systems reviewed and are negative.      Physical Exam    BP: 130/77 mmHg, Heart Rate: (!) 105, Temp: 97.5 F (36.4 C), Resp Rate: 16, SpO2: 99 %, Weight: 95.255 kg    Physical Exam   Constitutional: She is oriented to person, place, and time. Vital signs are normal. She appears well-developed and well-nourished.  Non-toxic appearance. She does not have a sickly appearance. She does not appear ill. She appears distressed.   HENT:   Head: Normocephalic and atraumatic.   Right Ear: External ear normal.   Left Ear: External ear normal.   Nose: Nose normal.   Mouth/Throat: Oropharynx is clear and moist and mucous membranes are normal.   Eyes: EOM and lids are normal. Pupils are  equal, round, and reactive to light. Left eye exhibits hordeolum. Left conjunctiva is injected.   Fundoscopic exam:       The right eye shows no papilledema.        The left eye shows no papilledema.   iop 12 and 14 on right and 11 and 11 on left   Neck: Trachea normal and full passive range of motion without pain. Neck supple. No spinous process tenderness present.   Cardiovascular: Normal rate, regular rhythm, normal heart sounds and normal pulses.    Pulmonary/Chest: Effort normal and breath sounds normal.   Abdominal: Soft. Normal appearance and bowel sounds are normal. She exhibits no distension. There is no tenderness. There is no rebound and no guarding.   Musculoskeletal: Normal range of motion.        Thoracic back: She exhibits normal range of motion, no tenderness, no bony tenderness and no pain.        Lumbar back: She exhibits normal range of motion, no tenderness, no bony tenderness and no pain.   Ext nt with full rom and nvi   Lymphadenopathy:     She has no cervical adenopathy.   Neurological: She is alert and oriented to person, place, and time. She has normal strength. No cranial nerve deficit or sensory deficit. GCS eye subscore is 4. GCS verbal subscore is 5. GCS motor subscore is 6.   Skin: Skin is warm, dry and intact. No rash noted.   Psychiatric: She has a normal mood and affect.   Nursing note and vitals reviewed.        MDM and ED Course     ED Medication Orders     Start     Status Ordering Provider    03/27/14 2225  azithromycin [facility]) tablet 500 mg   Once     Route: Oral  Ordered Dose: 500 mg     Acknowledged Magdala Brahmbhatt    03/27/14 2140  HYDROmorphone (DILAUDID) tablet 2 mg   Once     Route: Oral  Ordered Dose: 2 mg     Last MAR action:  Given Klayten Jolliff  03/27/14 2131  oxyCODONE-acetaminophen (PERCOCET) 5-325 MG per tablet 2 tablet   Once     Route: Oral  Ordered Dose: 2 tablet     Last MAR action:  Not Given Demetrice Amstutz    03/27/14 2131  ondansetron  (ZOFRAN-ODT) disintegrating tablet 4 mg   Once     Route: Oral  Ordered Dose: 4 mg     Last MAR action:  Given Yatzary Merriweather              MDM  Number of Diagnoses or Management Options  Acute nonintractable headache, unspecified headache type:   Acute sinusitis, recurrence not specified, unspecified location:   Cough:   Eye pain, left:   Stress response:   Diagnosis management comments: I, Michaelle Copas, MD, have been the primary provider for Catherine Spence during this Emergency Dept visit.    Oxygen saturation by pulse oximetry is 95%-100%, Normal.  Interventions: None Needed    Stress response with new left eye ache and recurrant ha- no obv path on testing tonight other than mildly red left eye- f/u optho and pcp and support sxs and rx acute sinusitis       Amount and/or Complexity of Data Reviewed  Clinical lab tests: reviewed and ordered  Tests in the radiology section of CPT: reviewed and ordered           Procedures    Clinical Impression & Disposition     Clinical Impression  Final diagnoses:   Acute sinusitis, recurrence not specified, unspecified location   Cough   Eye pain, left   Acute nonintractable headache, unspecified headache type   Stress response        ED Disposition     Discharge Catherine Spence discharge to home/self care.    Condition at disposition: Stable             New Prescriptions    AZITHROMYCIN (ZITHROMAX) 250 MG TABLET    Take 1 tablet (250 mg total) by mouth daily. Start tomorrow    HYDROMORPHONE (DILAUDID) 2 MG TABLET    Take 1 tablet (2 mg total) by mouth every 6 (six) hours as needed for Pain (As needed for pain).    ONDANSETRON (ZOFRAN ODT) 4 MG DISINTEGRATING TABLET    Take 1 tablet (4 mg total) by mouth every 6 (six) hours as needed.                   Michaelle Copas, MD  03/27/14 2229

## 2014-03-27 NOTE — Discharge Instructions (Signed)
Cough    You have been seen for your cough.    There are many possible causes of cough. Most are not dangerous. Your doctor has determined that it is OK for you to go home today.    Antibiotics are not necessary for your cough at this time. If your cough was caused by a virus, asthma, or lung irritation, then antibiotics will not help.    The doctor may have prescribed some medicine to help with your cough. Use the medicine as directed.    YOU SHOULD SEEK MEDICAL ATTENTION IMMEDIATELY, EITHER HERE OR AT THE NEAREST EMERGENCY DEPARTMENT, IF ANY OF THE FOLLOWING OCCURS:   You wheeze or have trouble breathing.   You cough up mucous or lose weight for no reason.   You have a fever (temperature higher than 100.55F / 38C) that lasts more than 5 days.   You have chest pain.   Your symptoms get worse or do not get better in 2 or 3 days.   You have any new problems or concerns.              Headache    You have been treated for a headache.    Headaches are very common. Most of the time they are benign (not harmful). Some headaches can be very serious. Your headache appears to be benign. The doctor feels it is OK for you to go home.    If you continue to have headaches, or if this headache does not resolve over the next few days, you should be evaluated by your regular doctor or a neurologist. Keep a "headache diary." This may help your doctor learn the cause of your headaches.    Take your headache medication as directed. This is especially important if your doctor has placed you on a daily medication to prevent headaches.    YOU SHOULD SEEK MEDICAL ATTENTION IMMEDIATELY, EITHER HERE OR AT THE NEAREST EMERGENCY DEPARTMENT, IF ANY OF THE FOLLOWING OCCURS:   Your headache gets worse.   You have a severe headache that occurs suddenly.   Your head pain is different from your normal headache.   You have a fever (temperature higher than 100.55F / 38C), especially with a stiff neck.   You feel numbness,  tingling, or weakness in your arms or legs.   You pass out.   You have problems with your vision.   You vomit and have trouble taking medication or keeping it down.              Sinusitis    You have been seen for a sinus infection.    Sinus infections are common. They often happen after people have a virus or a common cold.    Symptoms are: Pain in the face, green or yellow mucous from the nose, fever (temperature higher than 100.55F / 38C) and chills. There may also be a feeling of fullness or pressure in the face.    Decongestants may be used to help the sinuses drain. Sometimes a steroid spray is used.    You may need antibiotics for the infection. Not all cases of sinusitis need antibiotics. Viruses cause most sinusitis. Antibiotics do not work on viruses. Sometimes sinusitis and be caused by bacteria. These cases usually get better with medicine to help the sinuses drain. Antibiotics should be given only to patients who have symptoms for more than 2 weeks and if they have fever, severe sinus pain and pus mixed in with the mucus.  YOU SHOULD SEEK MEDICAL ATTENTION IMMEDIATELY, EITHER HERE OR AT THE NEAREST EMERGENCY DEPARTMENT, IF ANY OF THE FOLLOWING OCCURS:   You do not get better with treatment.   You have severe headaches and high fever (temperature higher than 100.104F / 38C) or any confusion.   You have worse pain in the face. Your face gets more swollen.

## 2014-03-27 NOTE — ED Notes (Signed)
pt reports yelling and screaming 20 minutes PTA. pt reports while screaming splitting HA, +N + lightheaded. Pt reports blurred vision in left eye. Pt holding pressure of left

## 2014-03-29 ENCOUNTER — Emergency Department
Admission: EM | Admit: 2014-03-29 | Discharge: 2014-03-29 | Disposition: A | Discharge status: Home / Self Care | Payer: Self-pay | Attending: Emergency Medicine | Admitting: Emergency Medicine

## 2014-03-29 ENCOUNTER — Emergency Department: Payer: Self-pay

## 2014-03-29 DIAGNOSIS — F17210 Nicotine dependence, cigarettes, uncomplicated: Secondary | ICD-10-CM | POA: Insufficient documentation

## 2014-03-29 DIAGNOSIS — H109 Unspecified conjunctivitis: Secondary | ICD-10-CM | POA: Insufficient documentation

## 2014-03-29 DIAGNOSIS — H1132 Conjunctival hemorrhage, left eye: Secondary | ICD-10-CM | POA: Insufficient documentation

## 2014-03-29 MED ORDER — CIPROFLOXACIN HCL 0.3 % OP SOLN
2.0000 [drp] | Freq: Once | OPHTHALMIC | Status: AC
Start: 2014-03-29 — End: 2014-03-29
  Administered 2014-03-29: 2 [drp] via OPHTHALMIC
  Filled 2014-03-29: qty 5
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
## 2014-03-29 NOTE — ED Notes (Signed)
Pt. was here Thursday night for left eye injury. She is returning today because her eye has worsened with swelling, itching and has a malodorous discharge

## 2014-03-29 NOTE — Discharge Instructions (Signed)
Conjunctivitis    You were diagnosed with conjunctivitis. This is also called "pink eye".    Conjunctivitis is an inflammation of the conjunctiva. These are the thin coverings of the white part of the eye and insides of the eyelids. It is caused by many different things. This includes viruses and bacteria. It even includes chemicals. Particles of "junk" that irritate the eye can also be a cause. Viruses are the most common cause.    Symptoms of conjunctivitis include pink eye and redness and drainage. It may feel like there is something in your eye (foreign body sensation). Your lid may get swollen. Your eyelids may also mat or get stuck in the morning.    Conjunctivitis can be very contagious. This means it can easily spread to others. You SHOULD NOT share hygienic items. This includes towels, make-up and tissues. You SHOULD NOT share clothing items. Wash your hands several times a day. Avoid touching your eyes.    Bacterial conjunctivitis is treated with warm compresses. It is also treated with ophthalmic (eye) antibiotics. These antibiotics are topical (not swallowed). Medicine is generally used for 5-7 days.    You SHOULD NOT wear contact lenses while you have conjunctivitis. Wait 48 hours after the infection completely clears up before using them again.    There is no specific follow-up needed. However, if you get any of the problems listed below, you will need a follow-up. If you don t get better as expected, you will also need a follow-up. It is always a good idea to get rechecked by your eye doctor if possible.    YOU SHOULD SEEK MEDICAL ATTENTION IMMEDIATELY, EITHER HERE OR AT THE NEAREST EMERGENCY DEPARTMENT, IF ANY OF THE FOLLOWING OCCURS:   Increasing eye pain.   Vision problems (problems seeing).   Photophobia (light bothering your eyes).   You don t get better after a few days or symptoms get worse at any time.              Subconjunctival Hemorrhage    You have been seen for a  "subconjunctival hemorrhage."    This happens when a small blood vessel in the white part of the eye breaks. It may be scary to have blood in your eye, but it is not dangerous.    The conjunctiva is a thin membrane that covers the white portion of your eye. It protects and lubricates it (keeps it wet). There are many small, delicate blood vessels in the conjunctiva that may break from a minor injury.    Since they are so delicate, the vessels can burst and bleed when the eye has a minor injury. This injury can be caused by rubbing the eye, sneezing, coughing or straining.    This condition usually gets better in 2-3 weeks. Normally, people don t have any long-term problems. You may notice that the bleeding area changes colors from red to brown to yellow over a period of several weeks. It is just like a bruise anywhere else on the body.    Follow up with your doctor as needed. It is not normally necessary to see an ophthalmologist (eye doctor).    YOU SHOULD SEEK MEDICAL ATTENTION IMMEDIATELY, EITHER HERE OR AT THE NEAREST EMERGENCY DEPARTMENT, IF ANY OF THE FOLLOWING OCCURS:   You have very bad eye pain.   Your vision changes.   The bleeding gets worse or you bruise easily on other parts of your body like the gums.

## 2014-05-05 ENCOUNTER — Emergency Department: Payer: Self-pay

## 2014-05-05 ENCOUNTER — Emergency Department
Admission: EM | Admit: 2014-05-05 | Discharge: 2014-05-05 | Disposition: A | Discharge status: Home / Self Care | Payer: Self-pay | Attending: Emergency Medicine | Admitting: Emergency Medicine

## 2014-05-05 DIAGNOSIS — R109 Unspecified abdominal pain: Secondary | ICD-10-CM | POA: Insufficient documentation

## 2014-05-05 DIAGNOSIS — F17210 Nicotine dependence, cigarettes, uncomplicated: Secondary | ICD-10-CM | POA: Insufficient documentation

## 2014-05-05 DIAGNOSIS — R531 Weakness: Secondary | ICD-10-CM | POA: Insufficient documentation

## 2014-05-05 LAB — COMPREHENSIVE METABOLIC PANEL
ALT: 15 U/L (ref 0–55)
AST (SGOT): 13 U/L (ref 5–34)
Albumin/Globulin Ratio: 1.5 (ref 0.9–2.2)
Albumin: 3.9 g/dL (ref 3.5–5.0)
Alkaline Phosphatase: 66 U/L (ref 37–106)
Anion Gap: 9 (ref 5.0–15.0)
BUN: 13.1 mg/dL (ref 7.0–19.0)
Bilirubin, Total: 0.4 mg/dL (ref 0.2–1.2)
CO2: 24 mEq/L (ref 22–29)
Calcium: 9.1 mg/dL (ref 8.5–10.5)
Chloride: 107 mEq/L (ref 100–111)
Creatinine: 0.8 mg/dL (ref 0.6–1.0)
Globulin: 2.6 g/dL (ref 2.0–3.6)
Glucose: 79 mg/dL (ref 70–100)
Potassium: 4.4 mEq/L (ref 3.5–5.1)
Protein, Total: 6.5 g/dL (ref 6.0–8.3)
Sodium: 140 mEq/L (ref 136–145)

## 2014-05-05 LAB — GFR: EGFR: >60

## 2014-05-05 LAB — URINALYSIS, REFLEX TO MICROSCOPIC EXAM IF INDICATED
Bilirubin, UA: NEGATIVE
Blood, UA: NEGATIVE
Glucose, UA: NEGATIVE
Ketones UA: NEGATIVE
Leukocyte Esterase, UA: NEGATIVE
Nitrite, UA: NEGATIVE
Protein, UR: NEGATIVE
Specific Gravity UA: 1.018 (ref 1.001–1.035)
Urine pH: 6 (ref 5.0–8.0)
Urobilinogen, UA: NORMAL mg/dL

## 2014-05-05 LAB — CBC AND DIFFERENTIAL
Basophils Absolute Automated: 0.01 10*3/uL (ref 0.00–0.20)
Basophils Automated: 0 %
Eosinophils Absolute Automated: 0.28 10*3/uL (ref 0.00–0.70)
Eosinophils Automated: 3 %
Hematocrit: 41 % (ref 37.0–47.0)
Hgb: 13.2 g/dL (ref 12.0–16.0)
Immature Granulocytes Absolute: 0.02 10*3/uL
Immature Granulocytes: 0 %
Lymphocytes Absolute Automated: 3.36 10*3/uL (ref 0.50–4.40)
Lymphocytes Automated: 39 %
MCH: 25.8 pg — ABNORMAL LOW (ref 28.0–32.0)
MCHC: 32.2 g/dL (ref 32.0–36.0)
MCV: 80.2 fL (ref 80.0–100.0)
MPV: 9.9 fL (ref 9.4–12.3)
Monocytes Absolute Automated: 0.46 10*3/uL (ref 0.00–1.20)
Monocytes: 5 %
Neutrophils Absolute: 4.56 10*3/uL (ref 1.80–8.10)
Neutrophils: 53 %
Platelets: 254 10*3/uL (ref 140–400)
RBC: 5.11 10*6/uL (ref 4.20–5.40)
RDW: 14 % (ref 12–15)
WBC: 8.67 10*3/uL (ref 3.50–10.80)

## 2014-05-05 LAB — HCG, SERUM, QUALITATIVE: Hcg Qualitative: NEGATIVE

## 2014-05-05 LAB — TSH: TSH: 0.9 u[IU]/mL (ref 0.35–4.94)

## 2014-05-05 MED ORDER — DICYCLOMINE HCL 10 MG/ML IM SOLN
20.0000 mg | Freq: Once | INTRAMUSCULAR | Status: AC
Start: 2014-05-05 — End: 2014-05-05
  Administered 2014-05-05: 20 mg via INTRAMUSCULAR
  Filled 2014-05-05: qty 2

## 2014-05-05 MED ORDER — SODIUM CHLORIDE 0.9 % IV BOLUS
1000.0000 mL | Freq: Once | INTRAVENOUS | Status: AC
Start: 2014-05-05 — End: 2014-05-05
  Administered 2014-05-05: 1000 mL via INTRAVENOUS

## 2014-05-05 MED ORDER — ONDANSETRON HCL 4 MG/2ML IJ SOLN
4.0000 mg | Freq: Once | INTRAMUSCULAR | Status: AC
Start: 2014-05-05 — End: 2014-05-05
  Administered 2014-05-05: 4 mg via INTRAVENOUS
  Filled 2014-05-05: qty 2

## 2014-05-05 NOTE — ED Provider Notes (Signed)
Physician/Midlevel provider first contact with patient: 05/05/14 1259         History     Chief Complaint   Patient presents with   . Abdominal Pain   . Emesis     Historian: Patient    Chief Complaint: Abdominal pain  Location: Epigastric  Timing: Over the past 2-3 days  Severity: Moderate  Quality: Cramping  Modifying Factors:    Associated signs and symptoms: Nausea, vomiting, diarrhea, fatigue  Context:      HPI: 24 year old female presents with complaint of abdominal cramping nausea, vomiting, diarrhea, fatigue over the past 2-3 days.  She reports that fatigue started 1 week ago.  Denies any blood in the stool or emesis.          Past Medical History   Diagnosis Date   . Childhood asthma    . Anxiety    . Depression    . Pollen allergies    . Other and unspecified ovarian cysts      left side   . HPV in female 2013     had cervical biopsies   . Postpartum depression      no meds   . Migraine        Past Surgical History   Procedure Laterality Date   . Tubes in ears         Family History   Problem Relation Age of Onset   . Drug abuse Father    . Drug abuse Maternal Aunt    . Alcohol abuse Maternal Uncle    . Depression Maternal Uncle    . Drug abuse Maternal Grandfather        Social  History   Substance Use Topics   . Smoking status: Current Every Day Smoker -- 0.50 packs/day for 15 years     Types: Cigarettes   . Smokeless tobacco: Never Used      Comment: trying to quit, has been smoking since age 68 up to 1 ppd   . Alcohol Use: No       .     Allergies   Allergen Reactions   . Amoxicillin Anaphylaxis   . Ceclor [Cefaclor] Anaphylaxis and Rash   . Macrodantin Anaphylaxis   . Penicillins Anaphylaxis   . Chocolate Nausea And Vomiting   . Flagyl [Metronidazole] Itching   . Percocet [Oxycodone-Acetaminophen] Nausea And Vomiting   . Sulfa Antibiotics Itching   . Vicodin [Hydrocodone-Acetaminophen] Hives     Passed out     . Clindamycin Rash   . Latex Rash       Current/Home Medications    No medications on file         Review of Systems   Constitutional: Positive for fatigue. Negative for fever and chills.   HENT: Negative for congestion, rhinorrhea and sore throat.    Eyes: Negative for pain and visual disturbance.   Respiratory: Negative for cough and shortness of breath.    Cardiovascular: Positive for palpitations. Negative for chest pain.   Gastrointestinal: Positive for nausea, vomiting, abdominal pain and diarrhea.   Genitourinary: Negative for dysuria and frequency.   Musculoskeletal: Negative for myalgias and back pain.   Skin: Negative for color change and rash.   Neurological: Positive for light-headedness. Negative for weakness and numbness.       Physical Exam    BP: 125/58 mmHg, Heart Rate: 71, Temp: 97.4 F (36.3 C), Resp Rate: 16, SpO2: 97 %, Weight: 104.327 kg  Physical Exam   Constitutional: She is oriented to person, place, and time. She appears well-developed and well-nourished.   HENT:   Head: Normocephalic and atraumatic.   Eyes: Conjunctivae and EOM are normal. Right eye exhibits no discharge. Left eye exhibits no discharge. No scleral icterus.   Neck: No JVD present. No tracheal deviation present.   Cardiovascular: Normal rate, regular rhythm and normal heart sounds.  Exam reveals no gallop and no friction rub.    No murmur heard.  Pulmonary/Chest: Effort normal and breath sounds normal. No stridor. No respiratory distress. She has no wheezes. She has no rales.   Abdominal: Soft. Bowel sounds are normal. She exhibits no distension and no mass. There is no tenderness. There is no rebound, no guarding and no CVA tenderness.   Musculoskeletal: Normal range of motion. She exhibits no edema or tenderness.   Neurological: She is alert and oriented to person, place, and time. She has normal strength. No sensory deficit. GCS eye subscore is 4. GCS verbal subscore is 5. GCS motor subscore is 6.   Skin: Skin is warm, dry and intact.   Psychiatric: She has a normal mood and affect. Her speech is normal and  behavior is normal. Judgment and thought content normal.         MDM and ED Course     ED Medication Orders     Start     Status Ordering Provider    05/05/14 1326  sodium chloride 0.9 % bolus 1,000 mL   Once     Route: Intravenous  Ordered Dose: 1,000 mL     Last MAR action:  New Bag Idan Prime    05/05/14 1326  ondansetron (ZOFRAN) injection 4 mg   Once     Route: Intravenous  Ordered Dose: 4 mg     Last MAR action:  Given Catherine Spence    05/05/14 1326  dicyclomine (BENTYL) injection 20 mg   Once     Route: Intramuscular  Ordered Dose: 20 mg     Last MAR action:  Given Rikia Sukhu              MDM  Number of Diagnoses or Management Options  Diagnosis management comments: I, Earline Mayotte MD, have been the primary provider for Catherine Spence during this Emergency Dept visit.      DDx includes, but is not limited to: Gastritis, gastroenteritis, irritable bowel syndrome    Initial Plan: Labs, IV fluids, antiemetics           EKG: NSR&R, NL axis, NL intervals, No acute st-t changes.    Patient without evidence of acute pathology at this time.  Appropriate for outpatient follow-up.  Labs discussed with the patient.    Results     Procedure Component Value Units Date/Time    hCG, Qualitative (Pos/Neg) [[id number]] Collected:  05/05/14 1330    Specimen Information:  Blood Updated:  05/05/14 1453     Hcg Qualitative Negative     TSH [[id number]] Collected:  05/05/14 1330    Specimen Information:  Blood Updated:  05/05/14 1438     Thyroid Stimulating Hormone 0.90 uIU/mL     Comprehensive metabolic panel [[id number]] Collected:  05/05/14 1330    Specimen Information:  Blood Updated:  05/05/14 1416     Glucose 79 mg/dL      BUN 75.6 mg/dL      Creatinine 0.8 mg/dL      Sodium 433 mEq/L  Potassium 4.4 mEq/L      Chloride 107 mEq/L      CO2 24 mEq/L      CALCIUM 9.1 mg/dL      Protein, Total 6.5 g/dL      Albumin 3.9 g/dL      AST (SGOT) 13 U/L      ALT 15 U/L      Alkaline Phosphatase 66 U/L       Bilirubin, Total 0.4 mg/dL      Globulin 2.6 g/dL      Albumin/Globulin Ratio 1.5      Anion Gap 9.0     GFR [[id number]] Collected:  05/05/14 1330     EGFR >60.0 Updated:  05/05/14 1416    UA with reflex to micro [[id number]] Collected:  05/05/14 1330    Specimen Information:  Urine Updated:  05/05/14 1351     Urine Type Clean Catch      Color, UA Yellow      Clarity, UA Clear      Specific Gravity UA 1.018      Urine pH 6.0      Leukocyte Esterase, UA Negative      Nitrite, UA Negative      Protein, UR Negative      Glucose, UA Negative      Ketones UA Negative      Urobilinogen, UA Normal mg/dL      Bilirubin, UA Negative      Blood, UA Negative     CBC with differential [[id number]]  (Abnormal) Collected:  05/05/14 1330    Specimen Information:  Blood / Blood Updated:  05/05/14 1345     WBC 8.67 x10 3/uL      Hgb 13.2 g/dL      Hematocrit 01.6 %      Platelets 254 x10 3/uL      RBC 5.11 x10 6/uL      MCV 80.2 fL      MCH 25.8 (L) pg      MCHC 32.2 g/dL      RDW 14 %      MPV 9.9 fL      Neutrophils 53 %      Lymphocytes Automated 39 %      Monocytes 5 %      Eosinophils Automated 3 %      Basophils Automated 0 %      Immature Granulocyte 0 %      Neutrophils Absolute 4.56 x10 3/uL      Abs Lymph Automated 3.36 x10 3/uL      Abs Mono Automated 0.46 x10 3/uL      Abs Eos Automated 0.28 x10 3/uL      Absolute Baso Automated 0.01 x10 3/uL      Absolute Immature Granulocyte 0.02 x10 3/uL         Radiology Results (24 Hour)     ** No results found for the last 24 hours. **            Procedures    Clinical Impression & Disposition     Clinical Impression  Final diagnoses:   Abdominal pain, unspecified abdominal location   Weakness        ED Disposition     Discharge JYOTI HARJU discharge to home/self care.    Condition at disposition: Stable             New Prescriptions    No medications on file  Earline Mayotte, MD  05/05/14 [phone]

## 2014-05-05 NOTE — Discharge Instructions (Signed)
[facility] Abdominal Pain    Today you were evaluated for abdominal pain. It may seem strange to go home without a diagnosis of what is causing your abdominal pain. As many as half of patients who come to the Emergency Department with abdominal pain, do not receive a clear diagnosis.     This may be because nothing serious is wrong, OR there may be something significant going on and it is too early to tell exactly what it is with the information we have available to us today.     There are three important things to know:     (1) We have NOT given you a DEFINITE diagnosis of what is causing the pain, but based on your history and exam, we do not think it is serious at this time. Since you do not have a definite diagnosis, you must still BE VERY WATCHFUL and RETURN IMMEDIATELY if the symptoms fail to improve or worsen. There is always a small, but real possibility, that there is something more seriously wrong, such as appendicitis.     (2) We recommend that a physician see you within the next 12-24 hours for a re-evaluation if the symptoms persist. If your physician is unavailable, we would be happy to see you here.     (3) We feel comfortable sending you home at this time, but there are some things that worry us more. We want you to return immediately to the Emergency Department if you develop:     Increasing pain -- or pain that does not go away   Persistent vomiting, especially if there is no diarrhea associated with this illness    Vomiting blood or blood in your stool. Blood can be bright red, dark red, or black and tarry if it is digested   Passing out or feeling extremely lightheaded   Yellowing of your skin or eyes   Uncontrollable diarrhea              Weakness / Fatigue    You have been seen today for generalized weakness. This may also be described as fatigue.    Weakness is a common problem, especially in older individuals.    It is important to understand the difference between true weakness  (real weakness from a nerve or brain problem) and the more common problem of fatigue. These words might seem similar but they do mean very different problems.   Fatigue: When a person is describing fatigue, they may feel tired out very quickly even with just a little activity. They may also say they are feeling tired, sleepy, easily exhausted and unable to do normal daily activities because they don't seem to have enough energy.   True Weakness: When someone has true weakness, it means that the muscles are not working right. For example, a leg might be truly weak if you can't support your weight on it or if you can't get up from a chair because the thigh muscles aren't strong enough.    There are many causes of weakness including; Infections (often kidney/bladder infections or pneumonias), electrolyte abnormalities (low sodium, low potassium), depression, and neurologic (brain or nerve) disorders.    After looking at the results of the blood tests or X-rays, the cause of your weakness is:   Unclear or unknown.    It is VERY IMPORTANT to see your primary care doctor. More testing may be needed to figure out the cause of your weakness.    YOU SHOULD SEEK MEDICAL ATTENTION   IMMEDIATELY, EITHER HERE OR AT THE NEAREST EMERGENCY DEPARTMENT, IF ANY OF THE FOLLOWING OCCURS:   Confusion, coma, agitation (becoming anxious or irritable).   Fever (temperature higher than 100.4F / 38C), vomiting.   Severe headache.   Signs of stroke (paralysis or numbness on one side of the body, drooping on one side of the face, difficulty talking).   Worsening weakness, difficulty standing, paralysis, loss of control of the bladder or bowels or difficulty swallowing.

## 2014-05-06 LAB — ECG 12-LEAD
Atrial Rate: 62 {beats}/min
P Axis: 39 degrees
P-R Interval: 180 ms
Q-T Interval: 424 ms
QRS Duration: 90 ms
QTC Calculation (Bezet): 430 ms
R Axis: 10 degrees
T Axis: 27 degrees
Ventricular Rate: 62 {beats}/min

## 2014-11-25 ENCOUNTER — Emergency Department
Admission: EM | Admit: 2014-11-25 | Discharge: 2014-11-25 | Disposition: A | Discharge status: Home / Self Care | Payer: Self-pay | Attending: Emergency Medicine | Admitting: Emergency Medicine

## 2014-11-25 ENCOUNTER — Emergency Department: Payer: Self-pay

## 2014-11-25 DIAGNOSIS — F17210 Nicotine dependence, cigarettes, uncomplicated: Secondary | ICD-10-CM | POA: Insufficient documentation

## 2014-11-25 DIAGNOSIS — L989 Disorder of the skin and subcutaneous tissue, unspecified: Secondary | ICD-10-CM

## 2014-11-25 DIAGNOSIS — L02413 Cutaneous abscess of right upper limb: Secondary | ICD-10-CM | POA: Insufficient documentation

## 2014-11-25 MED ORDER — IBUPROFEN 400 MG PO TABS
800.0000 mg | ORAL_TABLET | Freq: Once | ORAL | Status: AC
Start: 2014-11-25 — End: 2014-11-25
  Administered 2014-11-25: 800 mg via ORAL
  Filled 2014-11-25: qty 2

## 2014-11-25 MED ORDER — LIDOCAINE HCL (PF) 2 % IJ SOLN
5.0000 mL | Freq: Once | INTRAMUSCULAR | Status: AC
Start: 2014-11-25 — End: 2014-11-25
  Administered 2014-11-25: 5 mL via INTRADERMAL
  Filled 2014-11-25: qty 5

## 2014-11-25 NOTE — ED Provider Notes (Signed)
Physician/Midlevel provider first contact with patient: 11/25/14 1613         History     Chief Complaint   Patient presents with   . Abscess     HPI Comments: Patient is a 24 year old female, right-handed, comes to emergency room for evaluation of a cyst to the right inner forearm.  Patient reports she has had this cyst on the nose since getting a tattoo to the area.  Reports that recently he has increased in size and began to hurt her.  Patient reports today when carrying the grocery she felt a "pop" to the area.  Was seen at the urgent care who told her she might need a surgery to the area.  Sent to emergency room for evaluation.  Patient reports the pain is 9 out of 10, sharp, throbbing, constant, worse with direct palpation.  Patient has any weakness or numbness to the area.  Patient denies any IV drug use.    Patient is a 24 y.o. female presenting with abscess. The history is provided by the patient.   Abscess  Associated symptoms: no fever, no nausea and no vomiting             Past Medical History   Diagnosis Date   . Childhood asthma    . Anxiety    . Depression    . Pollen allergies    . Other and unspecified ovarian cysts      left side   . HPV in female 2013     had cervical biopsies   . Postpartum depression      no meds   . Migraine        Past Surgical History   Procedure Laterality Date   . Tubes in ears         Family History   Problem Relation Age of Onset   . Drug abuse Father    . Drug abuse Maternal Aunt    . Alcohol abuse Maternal Uncle    . Depression Maternal Uncle    . Drug abuse Maternal Grandfather        Social  Social History   Substance Use Topics   . Smoking status: Current Every Day Smoker -- 0.50 packs/day for 15 years     Types: Cigarettes   . Smokeless tobacco: Never Used      Comment: trying to quit, has been smoking since age 61 up to 1 ppd   . Alcohol Use: No       .     Allergies   Allergen Reactions   . Amoxicillin Anaphylaxis   . Ceclor [Cefaclor] Anaphylaxis and Rash   .  Macrodantin Anaphylaxis   . Penicillins Anaphylaxis   . Chocolate Nausea And Vomiting   . Flagyl [Metronidazole] Itching   . Percocet [Oxycodone-Acetaminophen] Nausea And Vomiting   . Sulfa Antibiotics Itching   . Vicodin [Hydrocodone-Acetaminophen] Hives     Passed out     . Clindamycin Rash       Home Medications     Last Medication Reconciliation Action:  In Progress Ryals, Windy Fast, RN 11/25/2014  4:01 PM          No Medications           Review of Systems   Constitutional: Negative for fever and chills.   HENT: Negative for sore throat.    Respiratory: Negative for cough.    Gastrointestinal: Negative for nausea, vomiting and abdominal pain.   Musculoskeletal: Negative  for back pain.        Left inner forearm pain and cyst   Skin: Negative for rash.   Neurological: Negative for weakness and numbness.   All other systems reviewed and are negative.      Physical Exam    BP: 138/86 mmHg, Heart Rate: 85, Temp: 97.6 F (36.4 C), Resp Rate: 18, SpO2: 97 %, Weight: 92.08 kg    Physical Exam   Constitutional: She is oriented to person, place, and time. She appears well-developed and well-nourished. No distress.   HENT:   Head: Normocephalic and atraumatic.   Eyes: Conjunctivae and EOM are normal. Pupils are equal, round, and reactive to light.   Neck: Normal range of motion.   Pulmonary/Chest: Effort normal.   Musculoskeletal: Normal range of motion. She exhibits tenderness. She exhibits no edema.        Arms:  Left mid inner forearm .  There is a small area with a cystlike lesion that is tense and tender to palpation.  There is no surrounding erythema.  There is no induration.  There is no active drainage   Neurological: She is alert and oriented to person, place, and time.   Skin: Skin is warm and dry. She is not diaphoretic.   Psychiatric: She has a normal mood and affect. Her behavior is normal. Judgment and thought content normal.   Nursing note and vitals reviewed.        MDM and ED Course     ED Medication  Orders     Start Ordered     Status Ordering Provider    11/25/14 1627 11/25/14 1626  ibuprofen (ADVIL,MOTRIN) tablet 800 mg   Once     Route: Oral  Ordered Dose: 800 mg     Last MAR action:  Given Franki Alcaide J    11/25/14 1627 11/25/14 1626  lidocaine (XYLOCAINE) 2 % injection (MPF) 5 mL   Once     Route: Intradermal  Ordered Dose: 5 mL     Last MAR action:  Given by Other Alontae Chaloux J             MDM  Number of Diagnoses or Management Options  Arm skin lesion, left:   Diagnosis management comments: I, Phyllis Ginger PA-C, have been the primary provider for Micheline Maze during this Emergency Dept visit.    The attending signature signifies review and agreement of the history, physical examination, evaluation, clinical impression and plan except as noted.     Oxygen saturation by pulse oximetry is 95%-100%, Normal.  Interventions: None Needed.    4:57 PM  Due to patient's multiple antibiotic ALLERGIES ee will I and D the lesion and have patient follow-up with a plastic surgeon for further evaluation.  On reevaluation during the I and D process structurally looked more like a cyst as opposed to an abscess.  There is no concerning fluctuance, no streaking, no erythema.  This was sent for culture.  Patient was strongly advised to come back to the emergency room if there is any surrounding erythema, redness, streaking, fever, etc.  Patient agrees with plan.  All questions answered.  Patient presented with good understanding.  We will discharge home.  Patient was sent home with chlorhexidine scrub soap as well.           Amount and/or Complexity of Data Reviewed  Clinical lab tests: ordered              Incision/Drainage  Date/Time:  11/25/2014 5:01 PM  Performed by: Paula Compton  Authorized by: Oliver Barre    Consent:     Consent obtained:  Verbal    Consent given by:  Patient    Risks discussed:  Bleeding, damage to other organs, incomplete drainage, pain and infection    Alternatives discussed:   No treatment  Universal protocol:     Procedure explained and questions answered to patient or proxy's satisfaction: yes      Relevant documents present and verified: yes      Test results available and properly labeled: yes      Imaging studies available: yes      Required blood products, implants, devices, and special equipment available: yes      Site/side marked: yes      Immediately prior to procedure a time out was called: yes      Patient identity confirmed:  Verbally with patient  Location:     Type:  Cyst    Size:  0.5    Location: Left inner forearm.  Pre-procedure details:     Skin preparation:  Betadine  Anesthesia (see MAR for exact dosages):     Anesthesia method:  Local infiltration    Local anesthetic:  Lidocaine 2% w/o epi  Procedure details:     Complexity:  Simple    Needle aspiration: no      Incision types:  Stab incision    Incision depth:  Dermal    Scalpel blade:  11    Wound management:  Irrigated with saline and probed and deloculated    Drainage characteristics: Thick and white.    Drainage amount:  Scant    Wound treatment:  Wound left open    Packing materials:  None  Post-procedure details:     Patient tolerance of procedure:  Tolerated well, no immediate complications      Clinical Impression & Disposition     Clinical Impression  Final diagnoses:   Arm skin lesion, left        ED Disposition     Discharge [facility]BIR[facility] discharge to home/self care.    Condition at disposition: Stable             There are no discharge medications for this patient.                  Paula Compton, PA  11/27/14 2327    Oliver Barre, MD  11/28/14 2227

## 2014-11-25 NOTE — ED Notes (Signed)
I, Louis Matte, MD, FACEP, have personally seen and examined this patient, and have fully participated in their care, I obtained an independent history and performed a physical exam and I agree with the care plan and impression of the Advanced Practice Provider.     Patient here with left forearm cystic like structure on the volar aspect that she is concerned is an abscess.  Physical exam 0.5 cm cystic structure with no surrounding erythema, no fluctuance.  PLAN attempt at I and D and culture per APP.  Only if cultures positive for bacteria.  We will and a buttocks be prescribed then, since patient is ALLERGIC to multiple anabiotics    Oliver Barre, MD  11/25/14 [phone]

## 2014-11-25 NOTE — Discharge Instructions (Signed)
Laceration, General Wound Care    Use the following wound care instructions for your laceration (cut):   Keep the wound clean and dry for the next 24 hours. Avoid excessive moisture. You can wash the wound gently with soap and water. Then apply a dry bandage.   DO NOT allow your wound to soak in water (don't do the dishes or go swimming, for example). You can shower, but do not rub your stitches too hard. Let the wound dry before putting another bandage on.   Take off old dressings every day. Then put on a clean, dry dressing.   If the dressing sticks to the wound, slightly moisten it with water. This way, it can come off more easily.   To help remove a scab, cleanse the area with a mixture of half hydrogen peroxide and half water. This will also help us to take out the sutures when they are ready to be taken out.   Let the area dry thoroughly.   Unless you receive instructions not to do so, you can place a thin layer of antibiotic ointment over the wound. You can buy Polysporin, Bacitracin, or Neosporin at the store. Neosporin can sometimes cause irritation to your skin. If this happens, stop using it and switch to another topical (surface) antibiotic.   If needed, put a clean, dry bandage over the wound to protect it.    Keep the injured area elevated (lifted) for the next 24 hours. This will decrease swelling and pain. You may also want to put ice on the area. Place some ice cubes in a re-sealable (Ziploc) bag and add some water. Put a thin washcloth between the bag and the skin. Apply the ice bag to the area for at least 20 minutes. Do this at least 4 times per day. It is okay to do this more often than directed. You can also do it for longer than directed. NEVER APPLY ICE DIRECTLY TO THE SKIN.    If you had a local anesthetic, it will wear off in about 2 hours. Until then, be careful not to hurt yourself because of having less feeling in the area.    Not all lacerations (cuts) will need  antibiotics. Your doctor may have decided that you need antibiotics to prevent an infection. Be sure to fill the prescription and take all medicines as directed.    If your doctor gave you a prescription for pain medicine, fill the prescription and use the medicine as directed.    YOU SHOULD SEEK MEDICAL ATTENTION IMMEDIATELY, EITHER HERE OR AT THE NEAREST EMERGENCY DEPARTMENT, IF ANY OF THE FOLLOWING OCCURS:   You see redness or swelling.   There are red streaks or there is redness around the wound.   The wound smells bad or has a lot of drainage.   You have fever (temperature higher than 100.4F or 38C), chills, worse pain and / or swelling.

## 2015-03-07 ENCOUNTER — Emergency Department: Payer: Self-pay

## 2015-03-07 ENCOUNTER — Emergency Department
Admission: EM | Admit: 2015-03-07 | Discharge: 2015-03-07 | Disposition: A | Discharge status: Home / Self Care | Payer: Self-pay | Attending: Emergency Medicine | Admitting: Emergency Medicine

## 2015-03-07 DIAGNOSIS — N83209 Unspecified ovarian cyst, unspecified side: Secondary | ICD-10-CM | POA: Insufficient documentation

## 2015-03-07 DIAGNOSIS — B9689 Other specified bacterial agents as the cause of diseases classified elsewhere: Secondary | ICD-10-CM

## 2015-03-07 DIAGNOSIS — N83299 Other ovarian cyst, unspecified side: Secondary | ICD-10-CM

## 2015-03-07 DIAGNOSIS — F17210 Nicotine dependence, cigarettes, uncomplicated: Secondary | ICD-10-CM | POA: Insufficient documentation

## 2015-03-07 DIAGNOSIS — R102 Pelvic and perineal pain: Secondary | ICD-10-CM | POA: Insufficient documentation

## 2015-03-07 DIAGNOSIS — N760 Acute vaginitis: Secondary | ICD-10-CM | POA: Insufficient documentation

## 2015-03-07 LAB — CBC AND DIFFERENTIAL
Basophils Absolute Automated: 0.01 10*3/uL (ref 0.00–0.20)
Basophils Automated: 0 %
Eosinophils Absolute Automated: 0.34 10*3/uL (ref 0.00–0.70)
Eosinophils Automated: 3 %
Hematocrit: 40.1 % (ref 37.0–47.0)
Hgb: 13.1 g/dL (ref 12.0–16.0)
Immature Granulocytes Absolute: 0.03 10*3/uL
Immature Granulocytes: 0 %
Lymphocytes Absolute Automated: 4.15 10*3/uL (ref 0.50–4.40)
Lymphocytes Automated: 38 %
MCH: 27.3 pg — ABNORMAL LOW (ref 28.0–32.0)
MCHC: 32.7 g/dL (ref 32.0–36.0)
MCV: 83.7 fL (ref 80.0–100.0)
MPV: 10.2 fL (ref 9.4–12.3)
Monocytes Absolute Automated: 0.81 10*3/uL (ref 0.00–1.20)
Monocytes: 7 %
Neutrophils Absolute: 5.68 10*3/uL (ref 1.80–8.10)
Neutrophils: 52 %
Platelets: 260 10*3/uL (ref 140–400)
RBC: 4.79 10*6/uL (ref 4.20–5.40)
RDW: 14 % (ref 12–15)
WBC: 10.99 10*3/uL — ABNORMAL HIGH (ref 3.50–10.80)

## 2015-03-07 LAB — COMPREHENSIVE METABOLIC PANEL
ALT: 17 U/L (ref 0–55)
AST (SGOT): 15 U/L (ref 5–34)
Albumin/Globulin Ratio: 1.3 (ref 0.9–2.2)
Albumin: 3.6 g/dL (ref 3.5–5.0)
Alkaline Phosphatase: 65 U/L (ref 37–106)
Anion Gap: 9 (ref 5.0–15.0)
BUN: 14.5 mg/dL (ref 7.0–19.0)
Bilirubin, Total: 0.1 mg/dL — ABNORMAL LOW (ref 0.2–1.2)
CO2: 27 mEq/L (ref 22–29)
Calcium: 8.7 mg/dL (ref 8.5–10.5)
Chloride: 108 mEq/L (ref 100–111)
Creatinine: 0.8 mg/dL (ref 0.6–1.0)
Globulin: 2.7 g/dL (ref 2.0–3.6)
Glucose: 73 mg/dL (ref 70–100)
Potassium: 4.1 mEq/L (ref 3.5–5.1)
Protein, Total: 6.3 g/dL (ref 6.0–8.3)
Sodium: 144 mEq/L (ref 136–145)

## 2015-03-07 LAB — URINALYSIS, REFLEX TO MICROSCOPIC EXAM IF INDICATED
Bilirubin, UA: NEGATIVE
Blood, UA: NEGATIVE
Glucose, UA: NEGATIVE
Ketones UA: NEGATIVE
Nitrite, UA: NEGATIVE
Protein, UR: NEGATIVE
Specific Gravity UA: 1.023 (ref 1.001–1.035)
Urine pH: 6 (ref 5.0–8.0)
Urobilinogen, UA: NORMAL mg/dL

## 2015-03-07 LAB — LIPASE: Lipase: 54 U/L (ref 8–78)

## 2015-03-07 LAB — URINE HCG QUALITATIVE: Urine HCG Qualitative: NEGATIVE

## 2015-03-07 LAB — GFR: EGFR: >60

## 2015-03-07 MED ORDER — KETOROLAC TROMETHAMINE 10 MG PO TABS
10.0000 mg | ORAL_TABLET | Freq: Four times a day (QID) | ORAL | Status: DC | PRN
Start: 2015-03-07 — End: 2015-06-16

## 2015-03-07 MED ORDER — ALUM & MAG HYDROXIDE-SIMETH 200-200-20 MG/5ML PO SUSP
30.0000 mL | Freq: Once | ORAL | Status: AC
Start: 2015-03-07 — End: 2015-03-07
  Administered 2015-03-07: 30 mL via ORAL
  Filled 2015-03-07: qty 30

## 2015-03-07 MED ORDER — LIDOCAINE VISCOUS 2 % MT SOLN
10.0000 mL | Freq: Once | OROMUCOSAL | Status: AC
Start: 2015-03-07 — End: 2015-03-07
  Administered 2015-03-07: 10 mL via OROMUCOSAL
  Filled 2015-03-07: qty 15

## 2015-03-07 MED ORDER — RISAQUAD PO CAPS
1.0000 | ORAL_CAPSULE | Freq: Every day | ORAL | Status: AC
Start: 2015-03-07 — End: 2015-03-21

## 2015-03-07 MED ORDER — FAMOTIDINE 10 MG/ML IV SOLN (WRAP)
20.0000 mg | Freq: Once | INTRAVENOUS | Status: AC
Start: 2015-03-07 — End: 2015-03-07
  Administered 2015-03-07: 20 mg via INTRAVENOUS
  Filled 2015-03-07: qty 2

## 2015-03-07 MED ORDER — KETOROLAC TROMETHAMINE 30 MG/ML IJ SOLN
30.0000 mg | Freq: Once | INTRAMUSCULAR | Status: AC
Start: 2015-03-07 — End: 2015-03-07
  Administered 2015-03-07: 30 mg via INTRAVENOUS
  Filled 2015-03-07: qty 1

## 2015-03-07 NOTE — ED Provider Notes (Signed)
Physician/Midlevel provider first contact with patient: 03/07/15 1923         History     Chief Complaint   Patient presents with   . Vaginal Discharge   . Abdominal Pain     HPI Comments: Pt is a 24 yo female who comes to ER c/o vaginal discharge with periumbilical pain from chronic hernia for last 6 days. Pt reports no dysuria/hematuria/frequency or urgency. Unsure of LNMP.   Reports hernia pain is 8/10 , sharp, throbbing, constant, reproducible with palpation. No n/v/d, no f/c.   No decreased appetite.   Reports she is sexually active with one female partner. Unsure of STD status  Reports vaginal discharge is clear, no odor.         The history is provided by the patient.            Past Medical History   Diagnosis Date   . Childhood asthma    . Anxiety    . Depression    . Pollen allergies    . Other and unspecified ovarian cysts      left side   . HPV in female 2013     had cervical biopsies   . Postpartum depression      no meds   . Migraine        Past Surgical History   Procedure Laterality Date   . Tubes in ears         Family History   Problem Relation Age of Onset   . Drug abuse Father    . Drug abuse Maternal Aunt    . Alcohol abuse Maternal Uncle    . Depression Maternal Uncle    . Drug abuse Maternal Grandfather        Social  Social History   Substance Use Topics   . Smoking status: Current Every Day Smoker -- 0.50 packs/day for 15 years     Types: Cigarettes   . Smokeless tobacco: Never Used      Comment: trying to quit, has been smoking since age 10 up to 1 ppd   . Alcohol Use: Yes       .     Allergies   Allergen Reactions   . Amoxicillin Anaphylaxis   . Ceclor [Cefaclor] Anaphylaxis and Rash   . Macrodantin Anaphylaxis   . Penicillins Anaphylaxis   . Chocolate Nausea And Vomiting   . Flagyl [Metronidazole] Itching   . Percocet [Oxycodone-Acetaminophen] Nausea And Vomiting   . Sulfa Antibiotics Itching   . Vicodin [Hydrocodone-Acetaminophen] Hives     Passed out     . Clindamycin Rash       Home  Medications     Last Medication Reconciliation Action:  In Progress Otis Brace, RN 03/07/2015  7:15 PM          No Medications           Review of Systems   Constitutional: Negative for fever and chills.   HENT: Negative for sore throat.    Eyes: Negative.    Respiratory: Negative for cough.    Gastrointestinal: Positive for abdominal pain. Negative for nausea and vomiting.   Genitourinary: Positive for vaginal discharge and pelvic pain. Negative for dysuria, flank pain, decreased urine volume, vaginal bleeding, difficulty urinating and dyspareunia.   Musculoskeletal: Negative for back pain.   Skin: Negative for rash.   Neurological: Negative for weakness and numbness.   Psychiatric/Behavioral: Negative.    All other systems reviewed and  are negative.      Physical Exam    BP: 150/65 mmHg, Heart Rate: 83, Temp: 97.6 F (36.4 C), Resp Rate: 16, SpO2: 99 %, Weight: 92.987 kg    Physical Exam   Constitutional: She is oriented to person, place, and time. She appears well-developed and well-nourished. She appears distressed.   HENT:   Head: Normocephalic and atraumatic.   Right Ear: External ear normal.   Left Ear: External ear normal.   Nose: Nose normal.   Mouth/Throat: Oropharynx is clear and moist.   Eyes: Conjunctivae and EOM are normal. Pupils are equal, round, and reactive to light.   Neck: Normal range of motion. Neck supple.   Cardiovascular: Normal rate, regular rhythm, normal heart sounds and intact distal pulses.    No murmur heard.  Pulmonary/Chest: Effort normal and breath sounds normal. No respiratory distress.   Abdominal: Soft. Bowel sounds are normal. She exhibits no distension and no mass. There is no tenderness. There is no rebound and no guarding.   Mild tenderness to the umbilical region, no hernia appreciated  No masses     Genitourinary: Vaginal discharge found.   Clear vaginal discharge  External exam unremarkable  Cervical os closed  No cervical motion tenderness on palpation      Musculoskeletal: Normal range of motion. She exhibits no edema or tenderness.   Neurological: She is alert and oriented to person, place, and time. No cranial nerve deficit. Coordination normal.   Skin: Skin is warm and dry. She is not diaphoretic.   Psychiatric: She has a normal mood and affect. Her behavior is normal. Judgment and thought content normal.   Nursing note and vitals reviewed.        MDM and ED Course     ED Medication Orders     Start Ordered     Status Ordering Provider    03/07/15 2106 03/07/15 2105  lidocaine viscous (XYLOCAINE) 2 % mouth solution 10 mL   Once     Route: Mouth/Throat  Ordered Dose: 10 mL     Last MAR action:  Given Kaushal Vannice J    03/07/15 2106 03/07/15 2105  alum & mag hydroxide-simethicone (MAALOX PLUS) 200-200-20 mg/5 mL suspension 30 mL   Once     Route: Oral  Ordered Dose: 30 mL     Last MAR action:  Given Phyllis Ginger J    03/07/15 2106 03/07/15 2105  famotidine (PEPCID) injection 20 mg   Once     Route: Intravenous  Ordered Dose: 20 mg     Last MAR action:  Given Atif Chapple J    03/07/15 2022 03/07/15 2021  ketorolac (TORADOL) injection 30 mg   Once     Route: Intravenous  Ordered Dose: 30 mg     Last MAR action:  Given Jeana Kersting J             MDM  Number of Diagnoses or Management Options  Bacterial vaginal infection:   Pelvic pain in female:   Physiological ovarian cysts:   Diagnosis management comments: I, Phyllis Ginger PA-C, have been the primary provider for Catherine Spence during this Emergency Dept visit.    The attending signature signifies review and agreement of the history, physical examination, evaluation, clinical impression and plan except as noted.     Oxygen saturation by pulse oximetry is 95%-100%, Normal.  Interventions: None Needed.    US Pelvic With Transvaginal (r/o Torsion)    03/07/2015  1.  No doppler evidence of ovarian torsion. 2.  Multiple right ovarian cysts measuring up to 3.6 cm. Emeline Darling, MD 03/07/2015 10:21 PM     10:47  PM  Pt was updated on results of lab and rad studies. Ovarian cysts noted on R ovary, torsion precautions given.   Pt clinically well appearing. All questions answered and pt presented with good understanding.  Advised to come back to ER if feeling worse or any other concerns. Close follow up as per [facility] instructions. Take Rx medication as prescribed. Pt agrees with plan.  Pt declined Clinamycin/Flagyl treatment for BV. States she develops hives with each and does not want either. No other alternative tx available at this time. We will Rx probiotics. Advised no intercourse until cleared by GYN, GC/Chlamydia studies pending. GYN referral provided.   Pt angry in ER. Asking for Morphine/Dilaudid as "the only medication that can treat my pain" and stating " I cant believe you are not treating my pain". I told pt we will not be treating her pelvic discomfort with opioid pain medication. Needs GYN follow up for further management.          Amount and/or Complexity of Data Reviewed  Clinical lab tests: ordered and reviewed  Tests in the radiology section of CPT: reviewed and ordered      Results     Procedure Component Value Units Date/Time    Wet Prep Trichomonas [[id number]] Collected:  03/07/15 2002    Specimen Information:  Cervical Swab Updated:  03/07/15 2038    Narrative:      ORDER#: [id number]                                    ORDERED BY: Phyllis Ginger  SOURCE: Cervical Swab                                COLLECTED:  03/07/15 20:02  ANTIBIOTICS AT COLL.:                                RECEIVED :  03/07/15 20:08  Wet Prep Trichomonas                       FINAL       03/07/15 20:38   +  03/07/15   Few WBCs Seen             No Trichomonas Seen             No Yeast/Fungal Elements Seen             Positive for Clue Cells: Few Seen             Bacterial morphotypes seen are consistent with Bacterial             Vaginosis             Reference Range: No Trichomonas or Yeast Seen      Comprehensive metabolic panel  [[id number]]  (Abnormal) Collected:  03/07/15 2004    Specimen Information:  Blood Updated:  03/07/15 2030     Glucose 73 mg/dL      BUN 62.9 mg/dL      Creatinine 0.8 mg/dL      Sodium 528 mEq/L      Potassium  4.1 mEq/L      Chloride 108 mEq/L      CO2 27 mEq/L      Calcium 8.7 mg/dL      Protein, Total 6.3 g/dL      Albumin 3.6 g/dL      AST (SGOT) 15 U/L      ALT 17 U/L      Alkaline Phosphatase 65 U/L      Bilirubin, Total 0.1 (L) mg/dL      Globulin 2.7 g/dL      Albumin/Globulin Ratio 1.3      Anion Gap 9.0     Lipase [[id number]] Collected:  03/07/15 2004    Specimen Information:  Blood Updated:  03/07/15 2030     Lipase 54 U/L     GFR [[id number]] Collected:  03/07/15 2004     EGFR >60.0 Updated:  03/07/15 2030    UA with reflex to micro (pts  3 + yrs) [[id number]]  (Abnormal) Collected:  03/07/15 1959    Specimen Information:  Urine Updated:  03/07/15 2017     Urine Type Clean Catch      Color, UA Yellow      Clarity, UA Sl Cloudy (A)      Specific Gravity UA 1.023      Urine pH 6.0      Leukocyte Esterase, UA Small (A)      Nitrite, UA Negative      Protein, UR Negative      Glucose, UA Negative      Ketones UA Negative      Urobilinogen, UA Normal mg/dL      Bilirubin, UA Negative      Blood, UA Negative      RBC, UA 0-5 /hpf      WBC, UA 0-5 /hpf      Squamous Epithelial Cells, Urine 6-10 /hpf     Urine HCG, Qualitative [[id number]] Collected:  03/07/15 1959    Specimen Information:  Urine Updated:  03/07/15 2013     Urine HCG Qualitative Negative     CBC with differential [[id number]]  (Abnormal) Collected:  03/07/15 2004    Specimen Information:  Blood from Blood Updated:  03/07/15 2012     WBC 10.99 (H) x10 3/uL      Hgb 13.1 g/dL      Hematocrit 24.4 %      Platelets 260 x10 3/uL      RBC 4.79 x10 6/uL      MCV 83.7 fL      MCH 27.3 (L) pg      MCHC 32.7 g/dL      RDW 14 %      MPV 10.2 fL      Neutrophils 52 %      Lymphocytes Automated 38 %      Monocytes 7 %      Eosinophils Automated 3 %      Basophils  Automated 0 %      Immature Granulocyte 0 %      Neutrophils Absolute 5.68 x10 3/uL      Abs Lymph Automated 4.15 x10 3/uL      Abs Mono Automated 0.81 x10 3/uL      Abs Eos Automated 0.34 x10 3/uL      Absolute Baso Automated 0.01 x10 3/uL      Absolute Immature Granulocyte 0.03 x10 3/uL               Procedures    Clinical  Impression & Disposition     Clinical Impression  Final diagnoses:   Bacterial vaginal infection   Pelvic pain in female   Physiological ovarian cysts        ED Disposition     Discharge Catherine Spence discharge to home/self care.    Condition at disposition: Stable             Discharge Medication List as of 03/07/2015 10:53 PM      START taking these medications    Details   ketorolac (TORADOL) 10 MG tablet Take 1 tablet (10 mg total) by mouth every 6 (six) hours as needed for Pain., Starting 03/07/2015, Until Discontinued, Print      lactobacillus/streptococcus (RISAQUAD) Cap Take 1 capsule by mouth daily., Starting 03/07/2015, Until Sat 03/21/15, Print                         Paula Compton, PA  03/10/15 1315    Kalman Drape, MD  03/10/15 2238

## 2015-03-07 NOTE — Discharge Instructions (Signed)
Ovarian Cyst     You have been diagnosed with an ovarian cyst.     Ovarian cysts are common in women with abdominal (belly) pain, pelvic pain or cramping.     Ovarian cysts are like small bubbles filled with fluid. These cysts are also called "ovarian follicles." They form in the ovary during a normal menstrual cycle (period). During your cycle, an egg ripens inside the cyst. It is getting ready for ovulation. Sometimes a follicle gets large or many follicles form. This can cause pelvic pain or cramps.     If the cyst gets too big, it may break open. When this happens, it releases blood and fluid into the abdomen (belly). This causes sudden severe pain. The pain often gets better within a few hours with no treatment. Some people need pain medicine.     A few women have a condition called polycystic ovarian disease. In these women, several cysts form on both ovaries during the menstrual cycle. This causes irregular (unpredictable) menstrual periods. This condition is caused by hormones.     To diagnose polycystic ovarian disease, a doctor usually examines a woman’s pelvis and asks questions about her symptoms. If the doctor cannot feel the cysts with his or her hands, an ultrasound may be needed.     Emergency ultrasound testing for cysts is rarely needed. Instead, the test is usually during regular weekday office hours in the radiology department. The medical staff will schedule the test in the next few days. You may also see your regular doctor instead to schedule the ultrasound.     The doctor has decided it is OK for you to go home.     If symptoms signal complications like infection or bleeding, you may need to come here or to the nearest Emergency Department.     Follow up with your gynecologist or regular doctor in the next few days. Tell your doctor about this visit.  · If you do not have a doctor or clinic to follow up with, tell the medical staff before leaving today. We can help make the arrangements for  you.     YOU SHOULD SEEK MEDICAL ATTENTION IMMEDIATELY, EITHER HERE OR AT THE NEAREST EMERGENCY DEPARTMENT, IF ANY OF THE FOLLOWING OCCURS:  · You have worse pain in the abdomen (belly), pelvis or back.  · More and more vaginal discharge or bleeding, or passing large blood clots.  · Fever (temperature higher than 100.4ºF / 38ºC), chills, nausea, vomiting (throwing up).  · Feeling dizzy, lightheaded or passing out.             Vaginitis     You have been diagnosed with vaginitis.     This is an infection caused by bacteria. Some symptoms are vaginal itching or pain, painful urination (peeing) and abnormal vaginal discharge (drainage) that sometimes has a bad smell. The diagnosis is based on symptoms and a physical exam. It is also based on examining the discharge under a microscope.     Vaginitis is treated with medicine for the specific type of infection. No other special treatment or follow-up is needed unless symptoms do not get better or your condition gets worse.     YOU SHOULD SEEK MEDICAL ATTENTION IMMEDIATELY, EITHER HERE OR AT THE NEAREST EMERGENCY DEPARTMENT, IF ANY OF THE FOLLOWING OCCURS:  · Pain in the pelvis or lower abdomen (belly).  · Fever (temperature higher than 100.4ºF / 38ºC) or chills.  · Discharge gets worse or vagina severely (seriously) irritated.

## 2015-05-06 ENCOUNTER — Emergency Department
Admission: EM | Admit: 2015-05-06 | Discharge: 2015-05-06 | Disposition: A | Discharge status: Home / Self Care | Payer: Self-pay | Attending: Emergency Medicine | Admitting: Emergency Medicine

## 2015-05-06 ENCOUNTER — Emergency Department: Payer: Self-pay

## 2015-05-06 DIAGNOSIS — Z720 Tobacco use: Secondary | ICD-10-CM

## 2015-05-06 DIAGNOSIS — Z711 Person with feared health complaint in whom no diagnosis is made: Secondary | ICD-10-CM

## 2015-05-06 DIAGNOSIS — N760 Acute vaginitis: Secondary | ICD-10-CM | POA: Insufficient documentation

## 2015-05-06 DIAGNOSIS — R300 Dysuria: Secondary | ICD-10-CM | POA: Insufficient documentation

## 2015-05-06 DIAGNOSIS — F17210 Nicotine dependence, cigarettes, uncomplicated: Secondary | ICD-10-CM | POA: Insufficient documentation

## 2015-05-06 DIAGNOSIS — Z202 Contact with and (suspected) exposure to infections with a predominantly sexual mode of transmission: Secondary | ICD-10-CM | POA: Insufficient documentation

## 2015-05-06 DIAGNOSIS — J029 Acute pharyngitis, unspecified: Secondary | ICD-10-CM | POA: Insufficient documentation

## 2015-05-06 LAB — URINALYSIS, REFLEX TO MICROSCOPIC EXAM IF INDICATED
Bilirubin, UA: NEGATIVE
Blood, UA: NEGATIVE
Glucose, UA: NEGATIVE
Ketones UA: NEGATIVE
Leukocyte Esterase, UA: NEGATIVE
Nitrite, UA: NEGATIVE
Protein, UR: NEGATIVE
Specific Gravity UA: 1.019 (ref 1.001–1.035)
Urine pH: 7 (ref 5.0–8.0)
Urobilinogen, UA: NORMAL mg/dL

## 2015-05-06 LAB — GROUP A STREP, RAPID ANTIGEN: Group A Strep, Rapid Antigen: NEGATIVE

## 2015-05-06 MED ORDER — CIPROFLOXACIN HCL 500 MG PO TABS
500.0000 mg | ORAL_TABLET | Freq: Two times a day (BID) | ORAL | Status: DC
Start: 2015-05-06 — End: 2015-06-16

## 2015-05-06 MED ORDER — DOXYCYCLINE MONOHYDRATE 100 MG PO CAPS
100.0000 mg | ORAL_CAPSULE | Freq: Once | ORAL | Status: AC
Start: 2015-05-06 — End: 2015-05-06
  Administered 2015-05-06: 100 mg via ORAL
  Filled 2015-05-06: qty 1

## 2015-05-06 MED ORDER — DOXYCYCLINE HYCLATE 100 MG PO TABS
100.0000 mg | ORAL_TABLET | Freq: Two times a day (BID) | ORAL | Status: DC
Start: 2015-05-06 — End: 2015-06-16

## 2015-05-06 MED ORDER — CIPROFLOXACIN HCL 500 MG PO TABS
500.0000 mg | ORAL_TABLET | Freq: Once | ORAL | Status: AC
Start: 2015-05-06 — End: 2015-05-06
  Administered 2015-05-06: 500 mg via ORAL
  Filled 2015-05-06: qty 1

## 2015-05-06 NOTE — ED Provider Notes (Signed)
I, Earline Mayotte MD, have discussed with the APP, and personally seen and examined this patient, and have fully participated in their care.    Brief HPI: 25 year old female presenting with complaint of vaginal discharge with dysuria and sore throats.  Patient's sexual partner also having penile discharge.  No abdominal pain, fever.      PHYSICAL EXAM    General:  Patient appeared in no distress  Vitals:  Vital signs reviewed, see nurses notes  HEENT: Oropharynx with mild erythema  Cardiovascular:  Regular rhythm, Normal sounds and absence of murmurs, rubs or gallops.  Lungs:  Clear to auscultation without rales, ronchi, or wheezing.  Abdomen:  Soft, No guarding, no rebound, nontender, positive bowel sounds.      I/P: Likely sexually transmitted disease    Labs, antibiotics, health department follow-up, gynecology follow-up        Earline Mayotte, MD  05/06/15 1549

## 2015-05-06 NOTE — ED Notes (Signed)
needs STD testing, vaginal discharge that is white with no odor, burning with urination. sore throat, sob while eating

## 2015-05-06 NOTE — ED Provider Notes (Signed)
Physician/Midlevel provider first contact with patient: 05/06/15 1302         History     Chief Complaint   Patient presents with   . Sore Throat   . Exposure to STD     HPI Comments:   Chief Complaint:  Vaginal discharge  Location:  Vaginal  Onset:  4 days prior to arrival  Character:  Clear to white  Aggravating/Alleviating Factors:  No aggravating or alleviating factors.  Associated concerned for possible sexually transmitted disease, burning with urination, shortness breath while eating and sore throat.  No symptom treatment.  Timing:  Constant  Environment:  Home  Severity:  Mild  Context:  Patient, with boyfriend, complains of vaginal discharge.  She states she has had 4 days of a clear to whitish thick discharge.  She states there is no odor.  Patient is also complaining of burning upon urination and concerns for possible sexually transmitted disease and wants testing.  Patient has associated sore throat and states she occasionally has shortness of breath while eating.    Patient reports being sexually active and not using protection.  She does have a history of Chlamydia that was treated.  She is getting back with her baby's father, who is been with other partners as she has recently.    Last menstrual period January 13.  Patient is gravida 2, para 2    PMD    gYn donald, f      The history is provided by the patient and medical records. No language interpreter was used.            Past Medical History   Diagnosis Date   . Childhood asthma    . Anxiety    . Depression    . Pollen allergies    . Other and unspecified ovarian cysts      left side   . HPV in female 2013     had cervical biopsies   . Postpartum depression      no meds   . Migraine    . Bacterial vaginitis        Past Surgical History   Procedure Laterality Date   . Tubes in ears         Family History   Problem Relation Age of Onset   . Drug abuse Father    . Drug abuse Maternal Aunt    . Alcohol abuse Maternal Uncle    . Depression Maternal  Uncle    . Drug abuse Maternal Grandfather        Social  Social History   Substance Use Topics   . Smoking status: Current Every Day Smoker -- 0.50 packs/day for 15 years     Types: Cigarettes   . Smokeless tobacco: Never Used      Comment: trying to quit, has been smoking since age 14 up to 1 ppd   . Alcohol Use: Yes       .     Allergies   Allergen Reactions   . Amoxicillin Anaphylaxis   . Ceclor [Cefaclor] Anaphylaxis and Rash   . Macrodantin Anaphylaxis   . Penicillins Anaphylaxis   . Chocolate Nausea And Vomiting   . Flagyl [Metronidazole] Itching   . Percocet [Oxycodone-Acetaminophen] Nausea And Vomiting   . Sulfa Antibiotics Itching   . Vicodin [Hydrocodone-Acetaminophen] Hives     Passed out     . Clindamycin Rash       Home Medications  Last Medication Reconciliation Action:  In Progress Otis Brace, RN 05/06/2015 12:36 PM                  ketorolac (TORADOL) 10 MG tablet     Take 1 tablet (10 mg total) by mouth every 6 (six) hours as needed for Pain.           Review of Systems   Constitutional: Negative for fever, chills, diaphoresis and activity change.   HENT: Positive for sore throat. Negative for congestion, rhinorrhea, trouble swallowing and voice change.    Eyes: Negative for pain, discharge and redness.   Respiratory: Negative for cough, chest tightness, shortness of breath and wheezing.    Cardiovascular: Negative for chest pain and palpitations.   Gastrointestinal: Negative for nausea, vomiting, abdominal pain and diarrhea.   Endocrine: Negative for polydipsia, polyphagia and polyuria.   Genitourinary: Positive for dysuria and vaginal discharge. Negative for urgency, frequency, hematuria, flank pain, decreased urine volume, vaginal bleeding, difficulty urinating, genital sores, vaginal pain, menstrual problem and pelvic pain.   Musculoskeletal: Negative for myalgias, back pain, arthralgias and neck pain.   Skin: Negative for color change, rash and wound.   Allergic/Immunologic:  Positive for food allergies. Negative for environmental allergies and immunocompromised state.   Neurological: Negative for dizziness, tremors, syncope, weakness, light-headedness, numbness and headaches.   Hematological: Negative for adenopathy. Does not bruise/bleed easily.   Psychiatric/Behavioral: Negative for confusion and agitation. The patient is not nervous/anxious.    All other systems reviewed and are negative.      Physical Exam    BP: 120/69 mmHg, Heart Rate: 90, Temp: 97.3 F (36.3 C), Resp Rate: 16, SpO2: 99 %, Weight: 90.719 kg    Physical Exam   Constitutional: She is oriented to person, place, and time. She appears well-developed and well-nourished. She is cooperative.  Non-toxic appearance. She does not appear ill. No distress.   HENT:   Head: Normocephalic and atraumatic.   Right Ear: External ear normal.   Left Ear: External ear normal.   Nose: Nose normal. No mucosal edema or rhinorrhea. Right sinus exhibits no maxillary sinus tenderness and no frontal sinus tenderness. Left sinus exhibits no maxillary sinus tenderness and no frontal sinus tenderness.   Mouth/Throat: Oropharynx is clear and moist. No oropharyngeal exudate, posterior oropharyngeal edema, posterior oropharyngeal erythema or tonsillar abscesses.   Eyes: Conjunctivae and lids are normal. Right conjunctiva is not injected. Left conjunctiva is not injected. No scleral icterus.   Neck: Normal range of motion. Neck supple. No thyromegaly present.   Cardiovascular: Normal rate, regular rhythm, normal heart sounds and intact distal pulses.    No murmur heard.  Pulses:       Radial pulses are 2+ on the right side.   Pulmonary/Chest: Effort normal and breath sounds normal. No respiratory distress. She has no wheezes. She has no rales. She exhibits no tenderness.   Abdominal: Soft. Bowel sounds are normal. She exhibits no distension, no abdominal bruit, no ascites, no pulsatile midline mass and no mass. There is no hepatosplenomegaly. There  is no tenderness. There is no rigidity, no rebound, no guarding and no CVA tenderness. No hernia. Hernia confirmed negative in the right inguinal area and confirmed negative in the left inguinal area.   Obese     Genitourinary: Uterus normal. Pelvic exam was performed with patient supine. No labial fusion. There is no rash, tenderness, lesion or injury on the right labia. There is no rash, tenderness, lesion or  injury on the left labia. Cervix exhibits no motion tenderness, no discharge and no friability. Right adnexum displays no mass, no tenderness and no fullness. Left adnexum displays no mass, no tenderness and no fullness. No erythema, tenderness or bleeding in the vagina. No foreign body around the vagina. No signs of injury around the vagina. Vaginal discharge found.   Musculoskeletal: Normal range of motion. She exhibits no edema or tenderness.   Pelvic exam with Lizeth, PCT   Lymphadenopathy:     She has no cervical adenopathy.        Right: No inguinal adenopathy present.        Left: No inguinal adenopathy present.   Neurological: She is alert and oriented to person, place, and time. No sensory deficit. She exhibits normal muscle tone. Coordination and gait normal.   Skin: Skin is warm and dry. No abrasion, no ecchymosis, no laceration, no lesion, no petechiae and no rash noted. She is not diaphoretic. No erythema. Nails show no clubbing.   Psychiatric: She has a normal mood and affect. Her speech is normal and behavior is normal. Judgment and thought content normal. Cognition and memory are normal.   Nursing note and vitals reviewed.        MDM and ED Course     ED Medication Orders     Start Ordered     Status Ordering Provider    05/06/15 1500 05/06/15 1428  doxycycline (MONODOX) capsule 100 mg   Once     Route: Oral  Ordered Dose: 100 mg     Last MAR action:  Given Leverne Amrhein A    05/06/15 1428 05/06/15 1428  ciprofloxacin (CIPRO) tablet 500 mg   Once     Route: Oral  Ordered Dose: 500 mg     Last  MAR action:  Given Orian Amberg A             MDM  Number of Diagnoses or Management Options  Acute pharyngitis, unspecified etiology: new and requires workup  Bacterial vaginosis: new and requires workup  Concern about STD in female without diagnosis: new and requires workup  Dysuria: new and requires workup  Tobacco use:   Diagnosis management comments: Plan: Labs, medications, supportive care and reassess.    Results reviewed.  Patient will be medicated in ED.  Due to her history of ALLERGIES, but not able to treat her BV.  She is asked to follow up with her OB and GYN about this.    Patient is been medicated.  She is ready for discharge.  She voices understanding of discharge and f/u instructions    Pt is well appearing, wb/amb and stable for discharge    The attending signature signifies review and agreement of the history , PE, evaluation, clinical impression and discharge plan except as otherwise noted.    I,Manjot Beumer, PA-C, have been the primary provider for Catherine Spence during this Emergency Dept visit.    Oxygen saturation by pulse oximetry is 95%-100%, Normal.  Interventions: None Needed.    DDX to include, but not limited to:  UTI, cervicitis, urethritis, BV, STD, pregnancy, strep  Plan:  Meds, supportive, GYN/hlth department referral    Results     Procedure Component Value Units Date/Time    WET PREP TRICHOMONAS ([id number]) Collected:  05/06/15 1331    Specimen Information:  Vaginal Swab Updated:  05/06/15 1410    Narrative:      ORDER#: [id number]  ORDERED BY:   Susano Cleckler  SOURCE: Vaginal Swab                                 COLLECTED:  05/06/15   13:31  ANTIBIOTICS AT COLL.:                                RECEIVED :  05/06/15   13:35  Wet Prep Trichomonas                       FINAL       05/06/15 14:10  05/06/15   Moderate WBCs Seen             No Trichomonas Seen             No Yeast/Fungal Elements Seen             No Clue Cells Seen              Bacterial morphotypes seen are consistent with Bacterial             Vaginosis             Reference Range: No Trichomonas or Yeast Seen     Rapid Strep (Group A Antigen) ([id number]) Collected:  05/06/15 1321    Specimen Information:  Throat Updated:  05/06/15 1344     Group A Strep, Rapid Antigen Negative     UA with reflex to micro (pts  3 + yrs) ([id number]) Collected:  05/06/15   1321    Specimen Information:  Urine Updated:  05/06/15 1335     Urine Type Clean Catch      Color, UA Yellow      Clarity, UA Clear      Specific Gravity UA 1.019      Urine pH 7.0      Leukocyte Esterase, UA Negative      Nitrite, UA Negative      Protein, UR Negative      Glucose, UA Negative      Ketones UA Negative      Urobilinogen, UA Normal mg/dL      Bilirubin, UA Negative      Blood, UA Negative         Radiology Results (24 Hour)     ** No results found for the last 24 hours. **        I have reviewed all labs and/or radiological studies. I have reviewed all xrays if any myself on the PACS system.           Amount and/or Complexity of Data Reviewed  Clinical lab tests: ordered and reviewed  Review and summarize past medical records: yes  Discuss the patient with other providers: yes (ED case d/w attending, potluri, j, who examined the patient and agrees with the current course, tx, and disposition plan.)  Independent visualization of images, tracings, or specimens: yes    Risk of Complications, Morbidity, and/or Mortality  Presenting problems: high  Diagnostic procedures: high  Management options: high    Patient Progress  Patient progress: stable            Procedures    Clinical Impression & Disposition     Clinical Impression  Final diagnoses:   Acute pharyngitis, unspecified etiology   Bacterial vaginosis   Dysuria   Concern  about STD in female without diagnosis   Tobacco use        ED Disposition     Discharge Catherine Spence discharge to home/self care.    Condition at disposition: Stable             Discharge Medication  List as of 05/06/2015  2:43 PM      START taking these medications    Details   ciprofloxacin (CIPRO) 500 MG tablet Take 1 tablet (500 mg total) by mouth 2 (two) times daily., Starting 05/06/2015, Until Discontinued, Print      doxycycline (VIBRA-TABS) 100 MG tablet Take 1 tablet (100 mg total) by mouth 2 (two) times daily., Starting 05/06/2015, Until Discontinued, Print                         Nowell Sites, Selinda Flavin, [place]  05/06/15 2139    ShiffertSelinda Flavin, PA  05/06/15 2148    Earline Mayotte, MD  05/14/15 [phone]

## 2015-05-06 NOTE — Discharge Instructions (Addendum)
Dysuria, Female    You have been seen for painful urination.    The medical term for painful urination is "Dysuria." There are several causes of painful urination. The most common is an infection in the bladder or kidneys.    Dysuria can also be caused by sexually-transmitted diseases like chlamydia or gonorrhea. Ovarian cysts, kidney stones, endometriosis and certain medicines are also causes. Cancer can be a cause, but this is rare. Dysuria can also happen because of local irritants. This could be douching, vaginal lubricants or scented feminine products or toilet paper.    We are not sure what has caused your case. It does not seem to be from anything dangerous. It is OK for you to go home today.    There were STD (sexually-transmitted disease) cultures. You will get the results in a few days.    You should see your family doctor or gynecologist in a few days.    YOU SHOULD SEEK MEDICAL ATTENTION IMMEDIATELY, EITHER HERE OR AT THE NEARST EMERGENCY DEPARTMENT IF ANY OF THE FOLLOWING OCCUR:   You have fever (temperature higher than 100.68F / 38C).   You have abdominal or pelvic pain.   You are vomiting.   You have worsening pain.   If you cannot urinate.              Antibiotics     Some Important Information You Need To Know.     Antibiotics are strong medicines that can kill bacteria. They can save lives. They keep bacterial infections from becoming more serious. Antibiotics do not work against infections that are caused by viruses. They will not make viral illnesses shorter. Overall, infections caused by viruses are much more common than those caused by bacteria.     Viral infections include:   Bronchitis.   Colds.   Flu (influenza).   Most coughs.   Most ear infections.   Most sore throats.   Stomach flu (viral gastroenteritis).    Bacterial infections include:   Bladder infections.   Skin and wound infections.   Sinus infections that last more than 2 weeks.   Some ear  infections.   Strep throat (about 10% (1 in 10) of sore throats).   Pneumonia (lung infection).    It is very important to limit the use of antibiotic medicines to treat only bacterial infections. When antibiotics are given for a viral illness, it can be hard to know if symptoms, including rash, are caused by the virus or an allergy to an antibiotic. This can cause problems in the future when antibiotics are needed to treat a bacterial infection.     Using antibiotics when they are not needed can cause bacteria to become resistant. It can also happen when antibiotics are not taken correctly. This means that the standard antibiotics may not be able to kill the bacteria that is making you ill.    Infections caused by drug-resistant bacteria:   Are much harder to treat.   Need more tests.   May require you to be checked into the hospital.   Are more expensive to treat.   Have a higher chance of complications including death.    Antibiotics can also have side effects. The most common side effects are nausea (sick to the stomach), vomiting (throwing up), diarrhea, and rashes.              Pharyngitis    You have been diagnosed with pharyngitis.    Pharyngitis is an infection  of the back of your throat. Most sore throats are caused by viruses and do not require antibiotics. Some sore throats are caused by bacteria. Antibiotics will help this type of sore throat. A test for Strep throat may be used to help in your diagnosis.    Symptoms of pharyngitis include fever (temperature higher than 100.39F / 38C), sore throat, and a hoarse voice. If you have cold symptoms such as sneezing and coughing, runny nose, or congestion, your sore throat is more likely to be caused by a virus and not bacteria.    Whether your sore throat is caused by a virus or bacteria, you may need medication for pain and fever. You should also drink a lot of fluid. If your sore throat is caused by bacteria, you will also need antibiotics.  If your sore throat is caused by a virus, you do not need antibiotics. Antibiotics will not kill the virus and they may cause side-effects, like diarrhea, abdominal cramps, or allergic reactions. Taking an antibiotic that you do not need may cause "resistance," meaning that antibiotic won't work in the future when you have a true bacterial infection.    YOU SHOULD SEEK MEDICAL ATTENTION IMMEDIATELY, EITHER HERE OR AT THE NEAREST EMERGENCY DEPARTMENT, IF ANY OF THE FOLLOWING OCCURS:   You have difficulty breathing.   Your voices changes or seems muffled.   You have trouble swallowing.   You have a fever (temperature higher than 100.39F / 38C) that won't go away.   You feel worse or do not improve after 2 to 3 days.            Clinics: STD Testing (free)    Walk-In Hours    [facility] - Monday 9:30 a.m. - 11:00 a.m.     [facility] - Thursday 1:30 p.m.- 3 p.m.     [facility] -   Tuesday 10 a.m. - 11:30 a.m.  Wednesday 5 p.m. - 6:30 p.m.     [facility] -   Monday - 2 p.m.- 3:30 p.m.  Friday - 9:30 a.m. - 11 a.m.     [facility] - Tuesday - 4:30 p.m. - 6 p.m.     [facility]  [facility] -   First and Third Wednesday 5 p.m. - 6:30 p.m.       [facility] Office:   [address], [address]  [place] [address]  [phone]   [facility] Office:  [address]  [place], [place] [address]  [phone]     [facility]:  [address]  [place], [place] [address]  [phone]     [facility]  [address]  [place], [place] [address]  [phone]     [facility] Office:   [employer], [address], [address]  [address], [place] [address]  [phone]     For more information: [email or web address]            Vaginitis    You have been diagnosed with vaginitis.    This is an infection caused by bacteria.  Some symptoms are vaginal itching or pain, painful urination (peeing) and abnormal vaginal discharge (drainage) that sometimes has a bad smell. The diagnosis is based on symptoms and a physical exam. It is also based on examining the discharge under a microscope.    Vaginitis is treated with medicine for the specific type of infection.  No other special treatment or follow-up is needed unless symptoms do not get better or your condition gets worse.    YOU SHOULD SEEK MEDICAL ATTENTION IMMEDIATELY, EITHER HERE OR AT THE NEAREST EMERGENCY DEPARTMENT, IF ANY OF THE FOLLOWING OCCURS:   Pain in the pelvis or lower abdomen (belly).   Fever (temperature higher than 100.62F / 38C) or chills.   Discharge gets worse or vagina severely (seriously) irritated.          Rest, fluids.  Tylenol or ibuprofen for pain, fever.   No sex until you finish antibiotics, then use condoms for protection from STDs  Activity as tolerated.

## 2015-05-08 NOTE — Progress Notes (Signed)
Quick Note:    Std cx negative for gc and chlamydia, tc negative. , on abx, no further change.  ______

## 2015-06-16 ENCOUNTER — Emergency Department: Payer: Self-pay

## 2015-06-16 ENCOUNTER — Emergency Department
Admission: EM | Admit: 2015-06-16 | Discharge: 2015-06-17 | Disposition: A | Discharge status: Home / Self Care | Payer: Self-pay | Attending: Emergency Medicine | Admitting: Emergency Medicine

## 2015-06-16 DIAGNOSIS — F17210 Nicotine dependence, cigarettes, uncomplicated: Secondary | ICD-10-CM | POA: Insufficient documentation

## 2015-06-16 DIAGNOSIS — J069 Acute upper respiratory infection, unspecified: Secondary | ICD-10-CM | POA: Insufficient documentation

## 2015-06-16 LAB — URINALYSIS, REFLEX TO MICROSCOPIC EXAM IF INDICATED
Bilirubin, UA: NEGATIVE
Glucose, UA: NEGATIVE
Ketones UA: NEGATIVE
Nitrite, UA: NEGATIVE
Protein, UR: NEGATIVE
Specific Gravity UA: 1.018 (ref 1.001–1.035)
Urine pH: 6 (ref 5.0–8.0)
Urobilinogen, UA: NORMAL mg/dL

## 2015-06-16 LAB — URINE HCG QUALITATIVE: Urine HCG Qualitative: NEGATIVE

## 2015-06-16 MED ORDER — ACETAMINOPHEN 500 MG PO TABS
1000.0000 mg | ORAL_TABLET | Freq: Once | ORAL | Status: DC
Start: 2015-06-16 — End: 2015-06-16

## 2015-06-16 MED ORDER — IBUPROFEN 600 MG PO TABS
600.0000 mg | ORAL_TABLET | Freq: Once | ORAL | Status: AC
Start: 2015-06-16 — End: 2015-06-16
  Administered 2015-06-16: 600 mg via ORAL
  Filled 2015-06-16: qty 1

## 2015-06-16 NOTE — ED Notes (Signed)
Pt states she was seen last month for back pain and an STD exposure. Today complains that the back pain has returned, now rating a 8/10. Also c/o headache rating a 5/10. Pt had an abscess to the left inner thigh that she popped at home. Also complaining of fatigue. No other complaints at this time.

## 2015-06-16 NOTE — ED Provider Notes (Signed)
Physician/Midlevel provider first contact with patient: 06/16/15 2210         History     Chief Complaint   Patient presents with   . Back Pain   . Abscess     Historian: Patient    Chief Complaint: Myalgias  Location: Generalized  Timing: Today  Severity: Moderate  Quality: Aching  Modifying Factors: Worse with movement  Associated signs and symptoms: Cough, congestion, fatigue, chills  Context:      HPI: 25 year old female presented with complaint of generalized myalgias, fatigue, cough, congestion, chills over the past day.  Also reporting dysuria.  No dyspnea.  Does have sick contacts.          Past Medical History   Diagnosis Date   . Childhood asthma    . Anxiety    . Depression    . Pollen allergies    . Other and unspecified ovarian cysts      left side   . HPV in female 2013     had cervical biopsies   . Postpartum depression      no meds   . Migraine    . Bacterial vaginitis        Past Surgical History   Procedure Laterality Date   . Tubes in ears         Family History   Problem Relation Age of Onset   . Drug abuse Father    . Drug abuse Maternal Aunt    . Alcohol abuse Maternal Uncle    . Depression Maternal Uncle    . Drug abuse Maternal Grandfather        Social  Social History   Substance Use Topics   . Smoking status: Current Every Day Smoker -- 0.50 packs/day for 15 years     Types: Cigarettes   . Smokeless tobacco: Never Used      Comment: trying to quit, has been smoking since age 72 up to 1 ppd   . Alcohol Use: Yes       .     Allergies   Allergen Reactions   . Amoxicillin Anaphylaxis   . Ceclor [Cefaclor] Anaphylaxis and Rash   . Macrodantin Anaphylaxis   . Penicillins Anaphylaxis   . Chocolate Nausea And Vomiting   . Flagyl [Metronidazole] Itching   . Percocet [Oxycodone-Acetaminophen] Nausea And Vomiting   . Sulfa Antibiotics Itching   . Vicodin [Hydrocodone-Acetaminophen] Hives     Passed out     . Clindamycin Rash       Home Medications     Last Medication Reconciliation Action:  In  Progress Dena Billet, RN 06/16/2015 10:10 PM          None on File           Review of Systems   Constitutional: Positive for fever and chills.   HENT: Positive for congestion. Negative for sore throat.    Eyes: Negative for pain and visual disturbance.   Respiratory: Positive for cough. Negative for shortness of breath.    Cardiovascular: Negative for chest pain and palpitations.   Gastrointestinal: Negative for vomiting, abdominal pain and diarrhea.   Genitourinary: Positive for dysuria.   Musculoskeletal: Positive for myalgias. Negative for back pain.   Skin: Negative for color change and rash.   All other systems reviewed and are negative.      Physical Exam    BP: 118/63 mmHg, Heart Rate: 98, Temp: 99.9 F (37.7 C), Resp Rate: 17, SpO2:  97 %, Weight: 92.987 kg     Physical Exam   Constitutional: She is oriented to person, place, and time. She appears well-developed and well-nourished.   HENT:   Head: Normocephalic and atraumatic.   Eyes: Conjunctivae and EOM are normal. Right eye exhibits no discharge. Left eye exhibits no discharge.   Neck: No JVD present. No tracheal deviation present.   Cardiovascular: Normal rate and regular rhythm.  Exam reveals no friction rub.    No murmur heard.  Pulmonary/Chest: Effort normal. No stridor. No respiratory distress. She has no wheezes. She has no rales.   Abdominal: Soft. She exhibits no distension. There is no tenderness. There is no rebound and no guarding.   Neurological: She is alert and oriented to person, place, and time.   Skin: Skin is warm and dry.   Psychiatric: She has a normal mood and affect. Her behavior is normal. Judgment and thought content normal.         MDM and ED Course     ED Medication Orders     Start Ordered     Status Ordering Provider    06/16/15 2220 06/16/15 2219     Once,   Status:  Discontinued     Route: Oral  Ordered Dose: 1,000 mg     Discontinued Zubin Pontillo    06/16/15 2220 06/16/15 2219  ibuprofen (ADVIL,MOTRIN) tablet 600  mg   Once     Route: Oral  Ordered Dose: 600 mg     Last MAR action:  Given Jasaun Carn             MDM  Number of Diagnoses or Management Options  Diagnosis management comments: I, Earline Mayotte MD, have been the primary provider for Catherine Spence during this Emergency Dept visit.      DDx includes, but is not limited to: Pneumonia, influenza, urinary tract infection    Initial Plan: Urinalysis, chest x-ray, influenza      Chest x-ray discussed Dr. Gaetano Net, no acute pathology.    Patient with multiple drug ALLERGIES.  Given this we will await urine culture results prior to treating.    Results     Procedure Component Value Units Date/Time    Urine culture [[id number]] Collected:  06/16/15 2246    Specimen Information:  Urine from Urine, Clean Catch Updated:  06/16/15 2321    Urine HCG, Qualitative [[id number]] Collected:  06/16/15 2246    Specimen Information:  Urine Updated:  06/16/15 2300     Urine HCG Qualitative Negative     UA with reflex to micro (pts  3 + yrs) [[id number]]  (Abnormal) Collected:  06/16/15 2246    Specimen Information:  Urine Updated:  06/16/15 2258     Urine Type Clean Catch      Color, UA Yellow      Clarity, UA Clear      Specific Gravity UA 1.018      Urine pH 6.0      Leukocyte Esterase, UA Trace (A)      Nitrite, UA Negative      Protein, UR Negative      Glucose, UA Negative      Ketones UA Negative      Urobilinogen, UA Normal mg/dL      Bilirubin, UA Negative      Blood, UA Small (A)      RBC, UA 6-10 (A) /hpf      WBC, UA 26-50 (A) /hpf  Squamous Epithelial Cells, Urine 6-10 /hpf     Rapid influenza A/B antigens [[id number]] Collected:  06/16/15 2224    Specimen Information:  Nasopharyngeal from Nasal Aspirate Updated:  06/16/15 2240    Narrative:      ORDER#: [id number]                                    ORDERED BY: Tiajuana Amass  SOURCE: Nasal Aspirate                               COLLECTED:  06/16/15 22:24  ANTIBIOTICS AT COLL.:                                RECEIVED :   06/16/15 22:27  Influenza Rapid Antigen A&B                FINAL       06/16/15 22:40  06/16/15   Negative for Influenza A and B             Reference Range: Negative          Radiology Results (24 Hour)     Procedure Component Value Units Date/Time    XR Chest 2 Views [[id number]] Resulted:  06/17/15 0016    Order Status:  Sent Updated:  06/16/15 2313                Procedures    Clinical Impression & Disposition     Clinical Impression  Final diagnoses:   Upper respiratory tract infection, unspecified type        ED Disposition     Discharge Catherine Spence discharge to home/self care.    Condition at disposition: Stable             New Prescriptions    No medications on file                   Earline Mayotte, MD  06/17/15 [phone]

## 2015-06-16 NOTE — ED Notes (Signed)
Denies weapons

## 2015-06-17 NOTE — Discharge Instructions (Signed)
Upper Respiratory Infection     You have been diagnosed with a viral upper respiratory infection, often called a "URI."     The symptoms of a viral upper respiratory infection may include fever (temperature higher than 100.4ºF / 38ºC), runny nose, congestion and sinus fullness, facial pain, earache, sore throat, cough, and occasionally wheezing. The symptoms usually improve within 3 to 4 days. It might take up to 10 days before you feel completely better.     URI’s are treated with fluids, rest, and medication for fever and pain. Sometimes decongestants, cough medications or-medications for wheezing can also help.     Antibiotics have NO effect whatsoever on viruses and ARE NOT needed for a URI. Taking antibiotics when they are not necessary can cause side effects (like diarrhea or allergic reactions). It can also cause resistance, meaning antibiotics won’t work when you need them in the future.     You do not need to follow up with a doctor unless you feel worse or have other concerns.     YOU SHOULD SEEK MEDICAL ATTENTION IMMEDIATELY, EITHER HERE OR AT THE NEAREST EMERGENCY DEPARTMENT, IF ANY OF THE FOLLOWING OCCURS:  · You have new or worse symptoms or concerns.  · You are short of breath.  · You have a severe headache, stiff neck, confusion, or problems thinking.  · You have nasal discharge, fever (temperature higher than 100.4ºF / 38ºC), or productive cough (one that brings up mucous from the lungs) lasting more that 10 days. Occasionally a viral cold may lead into a bacterial infection, such as a sinus infection, ear infection or pneumonia. Taking antibiotics during the cold will NOT prevent these infections, but if a secondary infection occurs, you might require additional treatment.

## 2015-06-19 NOTE — Progress Notes (Signed)
Quick Note:    ucx > 100,000 klebsiella pneumonia: on no abx: pt had lots of allergies: Recommend cipro 500mg  po bid x 5 days, : Confirm pt has no allergies to cipro. Recommend no gym or sports activity, can affect muscle weakness/ ligament. Recommend how is pt feeling has pt had f/u if worsening ssx return to ED.  ______

## 2015-06-20 NOTE — Progress Notes (Signed)
Quick Note:    Call to pt. Advised of lab results and need for treatment. Confirmed not allergic to abx. Advised no gym or sport activity due to possible ligament concerns. Advised to fu with pcp to repeat ucx if ssx worsen return to the ED. Prescription called to pharmacy.  ______

## 2015-08-27 ENCOUNTER — Emergency Department
Admission: EM | Admit: 2015-08-27 | Discharge: 2015-08-27 | Disposition: A | Discharge status: Home / Self Care | Payer: Self-pay | Attending: Emergency Medicine | Admitting: Emergency Medicine

## 2015-08-27 ENCOUNTER — Emergency Department: Payer: Self-pay

## 2015-08-27 DIAGNOSIS — N390 Urinary tract infection, site not specified: Secondary | ICD-10-CM | POA: Insufficient documentation

## 2015-08-27 DIAGNOSIS — K429 Umbilical hernia without obstruction or gangrene: Secondary | ICD-10-CM | POA: Insufficient documentation

## 2015-08-27 DIAGNOSIS — F17210 Nicotine dependence, cigarettes, uncomplicated: Secondary | ICD-10-CM | POA: Insufficient documentation

## 2015-08-27 LAB — URINALYSIS, REFLEX TO MICROSCOPIC EXAM IF INDICATED
Bilirubin, UA: NEGATIVE
Blood, UA: NEGATIVE
Glucose, UA: NEGATIVE
Ketones UA: NEGATIVE
Nitrite, UA: NEGATIVE
Protein, UR: NEGATIVE
Specific Gravity UA: 1.017 (ref 1.001–1.035)
Urine pH: 6 (ref 5.0–8.0)
Urobilinogen, UA: NORMAL mg/dL

## 2015-08-27 LAB — URINE HCG QUALITATIVE: Urine HCG Qualitative: NEGATIVE

## 2015-08-27 MED ORDER — CIPROFLOXACIN HCL 500 MG PO TABS
500.0000 mg | ORAL_TABLET | Freq: Once | ORAL | Status: AC
Start: 2015-08-27 — End: 2015-08-27
  Administered 2015-08-27: 500 mg via ORAL
  Filled 2015-08-27: qty 1

## 2015-08-27 NOTE — Discharge Instructions (Addendum)
Urinary Tract Infection    You have been diagnosed with a lower urinary tract infection (UTI). This is also called cystitis.    Cystitis is an infection in your bladder. Your doctor diagnosed it by testing your urine. Cystitis usually causes burning with urination or frequent urination. It might make you feel like you have to urinate even when you don't.     Cystitis is usually treated with antibiotics and medicine to help with pain.    It is VERY IMPORTANT that you fill your prescription and take all of the antibiotics as directed. If a lower urinary tract infection goes untreated for too long, it can become a kidney infection.    FOR WOMEN: To reduce the risk of getting cystitis again:   Always urinate before and after sexual intercourse.   Always wipe from front to back after urinating or having a bowel movement. Do not wipe from back to front.   Drink plenty of fluids. Try to drink cranberry or blueberry juice. These juices have a chemical that stops bacteria from "sticking" to the bladder.    YOU SHOULD SEEK MEDICAL ATTENTION IMMEDIATELY, EITHER HERE OR AT THE NEAREST EMERGENCY DEPARTMENT, IF ANY OF THE FOLLOWING OCCURS:   You have a fever (temperature higher than 100.25F / 38C) or shaking chills.   You feel nauseated or vomit.   You have pain in your side or back.   You don't get better after taking all of your antibiotics.   You have any new symptoms or concerns.   You feel worse or do not improve.              Umbilical Hernia    You have been diagnosed with an umbilical hernia.    A hernia is an abnormal opening in the abdominal (belly) wall. Most hernias happen when a piece of intestine or fat pokes through a weak part in the abdominal wall. This creates a bulge. You might be able to see or feel it.    Hernias are usually in the groin. They can also happen around any past incision (cut). Your hernia is around the umbilicus (belly button). Hernias may develop very suddenly or very slowly.  They may take several months or years to develop.    Sometimes, the part of the intestine that slips through the hernia gets twisted (strangulated). It can also get trapped (incarcerated). This often causes severe (serious) and persistent (constant) pain that does not go away. If this happens, lie down. Push on the bulge gently with your hand. The pain may get worse or not go away. If so, return here or go to the nearest Emergency Department IMMEDIATELY.    If the bowel or tissue can be pushed back through the hernia, it is not dangerous.    Umbilical hernias may cause a dull ache or mild pain. Usually, only acetaminophen (Tylenol) or ibuprofen (Advil or Motrin) is needed for pain control.    Limit heavy lifting, straining and pushing.    Your doctor may direct you to wear a binder or truss. This is like a brace around your belly.    Most hernias need surgery to be fixed. You have been referred to:   A surgeon for further evaluation of your hernia.    YOU SHOULD SEEK MEDICAL ATTENTION IMMEDIATELY, EITHER HERE OR AT THE NEAREST EMERGENCY DEPARTMENT, IF ANY OF THE FOLLOWING OCCURS:   Pain gets worse or changes.   The hernia pops out and won t go  back in.   Nausea (feeling sick) or vomiting (throwing up).   The hernia gets tender, swollen or discolored (changes color).            Rest, fluids.  Tylenol or ibuprofen for pain, fever.   Take the Cipro you have at home.  i tab by mouth twice a day x 5 days.  Activity as tolerated.

## 2015-08-27 NOTE — ED Notes (Signed)
perumbilical hernia. pt reports she is unable to "pop it back in". pt reports vomiting. pt denies any weapons

## 2015-08-27 NOTE — ED Notes (Signed)
IMelvern Sample, DO, have personally seen and examined this patient, and have fully participated in her care.     Additional findings: 25 year old female with hernia.  Physical exam she is alert, oriented 3.  Umbilical hernia easily reduced.  No erythema.    Melvern Sample, DO  08/27/15 2027

## 2015-08-27 NOTE — ED Provider Notes (Signed)
Physician/Midlevel provider first contact with patient: 08/27/15 2007         History     Chief Complaint   Patient presents with   . Hernia   . Nausea   . Abdominal Pain     HPI Comments:   Chief Complaint:  Hernia  Location:  Periumbilical  Onset:  2015  Character:  Tenderness  Aggravating/Alleviating Factors:  Worse with emotional stress.  No alleviating factors.  Associated inability to reduce, vomiting.  No symptom treatment  Timing:  Episodic  Environment:  Home  Severity:  Mild  Context:  Patient, alone to ED, complains of periumbilical pain.  She states that she has a hernia in this area that was diagnosed in 2015.  After an argument last night with her boyfriend.  She states that she was unable to reduce the hernia.  This caused her to have pain that made her vomit.    Denies fever, chills, sweats, chest pain, dyspnea, nausea, pregnancy, dysuria, bruising, swelling, skin color changes, physical trauma    PMD  Dorinda Hill, f      The history is provided by the patient and medical records. No language interpreter was used.            Past Medical History   Diagnosis Date   . Childhood asthma    . Anxiety    . Depression    . Pollen allergies    . Other and unspecified ovarian cysts      left side   . HPV in female 2013     had cervical biopsies   . Postpartum depression      no meds   . Migraine    . Bacterial vaginitis        Past Surgical History   Procedure Laterality Date   . Tubes in ears         Family History   Problem Relation Age of Onset   . Drug abuse Father    . Drug abuse Maternal Aunt    . Alcohol abuse Maternal Uncle    . Depression Maternal Uncle    . Drug abuse Maternal Grandfather        Social  Social History   Substance Use Topics   . Smoking status: Current Every Day Smoker -- 0.50 packs/day for 15 years     Types: Cigarettes   . Smokeless tobacco: Never Used      Comment: trying to quit, has been smoking since age 33 up to 1 ppd   . Alcohol Use: Yes       .     Allergies   Allergen Reactions    . Amoxicillin Anaphylaxis   . Ceclor [Cefaclor] Anaphylaxis and Rash   . Macrodantin Anaphylaxis   . Penicillins Anaphylaxis   . Chocolate Nausea And Vomiting   . Flagyl [Metronidazole] Itching   . Percocet [Oxycodone-Acetaminophen] Nausea And Vomiting   . Sulfa Antibiotics Itching   . Vicodin [Hydrocodone-Acetaminophen] Hives     Passed out     . Clindamycin Rash       Home Medications     Last Medication Reconciliation Action:  In Progress Otis Brace, RN 08/27/2015  6:34 PM          No Medications           Review of Systems   Constitutional: Negative for fever, chills, diaphoresis, activity change and unexpected weight change.   HENT: Negative for congestion, rhinorrhea, sore throat, trouble  swallowing and voice change.    Eyes: Negative for pain, discharge and redness.   Respiratory: Negative for cough, chest tightness, shortness of breath and wheezing.    Cardiovascular: Negative for chest pain, palpitations and leg swelling.   Gastrointestinal: Positive for vomiting and abdominal pain. Negative for nausea, diarrhea and abdominal distention.   Endocrine: Negative for polydipsia, polyphagia and polyuria.   Genitourinary: Negative for dysuria, urgency, frequency, vaginal bleeding and vaginal discharge.   Musculoskeletal: Negative for myalgias, back pain, arthralgias and neck pain.   Skin: Negative for color change, rash and wound.   Allergic/Immunologic: Negative for environmental allergies, food allergies and immunocompromised state.   Neurological: Negative for dizziness, weakness, light-headedness, numbness and headaches.   Hematological: Negative for adenopathy. Does not bruise/bleed easily.   Psychiatric/Behavioral: Negative for confusion and agitation. The patient is not nervous/anxious.    All other systems reviewed and are negative.      Physical Exam    BP: 113/56 mmHg, Heart Rate: 77, Temp: (!) 96.6 F (35.9 C), Resp Rate: 16, SpO2: 99 %, Weight: 117.482 kg    Physical Exam    Constitutional: She is oriented to person, place, and time. She appears well-developed and well-nourished. She is cooperative.  Non-toxic appearance. She does not appear ill. No distress.   HENT:   Head: Normocephalic and atraumatic.   Nose: Nose normal. No mucosal edema or rhinorrhea. Right sinus exhibits no maxillary sinus tenderness and no frontal sinus tenderness. Left sinus exhibits no maxillary sinus tenderness and no frontal sinus tenderness.   Mouth/Throat: Oropharynx is clear and moist. No oropharyngeal exudate, posterior oropharyngeal edema, posterior oropharyngeal erythema or tonsillar abscesses.   Eyes: Conjunctivae and lids are normal. Right conjunctiva is not injected. Left conjunctiva is not injected. No scleral icterus.   Neck: Normal range of motion. Neck supple. No thyromegaly present.   Cardiovascular: Normal rate, regular rhythm, normal heart sounds and intact distal pulses.    No murmur heard.  Pulses:       Radial pulses are 2+ on the right side.   Pulmonary/Chest: Effort normal and breath sounds normal. No respiratory distress. She has no wheezes. She has no rales. She exhibits no tenderness.   Abdominal: Soft. Bowel sounds are normal. She exhibits no distension, no abdominal bruit, no ascites, no pulsatile midline mass and no mass. There is no hepatosplenomegaly. There is tenderness in the periumbilical area. There is no rigidity, no rebound, no guarding and no CVA tenderness. A hernia is present.       obese   Musculoskeletal: Normal range of motion. She exhibits no edema or tenderness.   Lymphadenopathy:     She has no cervical adenopathy.   Neurological: She is alert and oriented to person, place, and time. No sensory deficit. She exhibits normal muscle tone. Coordination and gait normal.   Skin: Skin is warm and dry. No abrasion, no ecchymosis, no laceration, no lesion, no petechiae and no rash noted. She is not diaphoretic. No erythema. Nails show no clubbing.   Psychiatric: She has a  normal mood and affect. Her speech is normal and behavior is normal. Judgment and thought content normal. Cognition and memory are normal.   Nursing note and vitals reviewed.        MDM and ED Course     ED Medication Orders     Start Ordered     Status Ordering Provider    08/27/15 2100 08/27/15 2059  ciprofloxacin (CIPRO) tablet 500 mg   Once  Route: Oral  Ordered Dose: 500 mg     Last MAR action:  Given Marqui Formby A             MDM  Number of Diagnoses or Management Options  Acute UTI: new and requires workup  Periumbilical hernia:   Diagnosis management comments: Plan:  Labs, supportive care, reassess.    Lab results reviewed.  Appear consistent with urinary tract infection.  Due to The patient's ALLERGIES and prior urine cultures,  She will be administered a dose of Cipro.  Urine submitted for culture.  No further imaging required of reducible hernia.  Patient will be provided with general surgery follow-up.  She has been medicated and ready for discharge.  Voices underStanding of discharge and follow up instructions.    Pt is well appearing, wb/amb and stable for discharge    The attending signature signifies review and agreement of the history , PE, evaluation, clinical impression and discharge plan except as otherwise noted.    I,Atiyana Welte, PA-C, have been the primary provider for Micheline Maze during this Emergency Dept visit.    Oxygen saturation by pulse oximetry is 95%-100%, Normal.  Interventions: None Needed.    DDX to include, but not limited to:  Uti, umbilical hernia, less likely pregnant, incarcerated/strangulate hernia  Plan:  Meds, ur cx, supportive care, pmd/gen surg referral.    Results     Procedure Component Value Units Date/Time    UA with reflex to micro (pts  3 + yrs) ([id number])  (Abnormal) Collected:    08/27/15 2034    Specimen Information:  Urine Updated:  08/27/15 2047     Urine Type Clean Catch      Color, UA Yellow      Clarity, UA Cloudy (A)      Specific Gravity UA  1.017      Urine pH 6.0      Leukocyte Esterase, UA Trace (A)      Nitrite, UA Negative      Protein, UR Negative      Glucose, UA Negative      Ketones UA Negative      Urobilinogen, UA Normal mg/dL      Bilirubin, UA Negative      Blood, UA Negative      RBC, UA 3 - 5 /hpf      WBC, UA 6-10 (A) /hpf      Squamous Epithelial Cells, Urine 11-25 /hpf     Urine HCG, Qualitative ([id number]) Collected:  08/27/15 2034    Specimen Information:  Urine Updated:  08/27/15 2041     Urine HCG Qualitative Negative         Radiology Results (24 Hour)     ** No results found for the last 24 hours. **        I have reviewed all labs and/or radiological studies. I have reviewed all xrays if any myself on the PACS system.      " *This note was generated by the Epic EMR system/ Dragon speech recognition and   may contain inherent errors or omissions not intended by the user. Grammatical    errors, random word insertions, deletions, pronoun errors and incomplete   sentences are occasional consequences of this technology due to software   limitations. Not all errors are caught or corrected. If there are questions or    concerns about the content of this note or information contained within the body   of this dictation they  should be addressed directly with the author for   Clarification."       Amount and/or Complexity of Data Reviewed  Clinical lab tests: ordered and reviewed  Tests in the radiology section of CPT: reviewed  Review and summarize past medical records: yes  Discuss the patient with other providers: yes (ED case d/w attending, kugler, j, who examined the patient and agrees with the current course, tx, and disposition plan.)  Independent visualization of images, tracings, or specimens: yes    Risk of Complications, Morbidity, and/or Mortality  Presenting problems: high  Diagnostic procedures: high  Management options: high    Patient Progress  Patient progress: stable            Procedures    Clinical Impression  & Disposition     Clinical Impression  Final diagnoses:   Periumbilical hernia   Acute UTI        ED Disposition     Discharge BASHA KRYGIER discharge to home/self care.    Condition at disposition: Stable             There are no discharge medications for this patient.                  Quana Chamberlain, Selinda Flavin, [place]  08/30/15 [phone]

## 2015-10-09 DIAGNOSIS — Z5320 Procedure and treatment not carried out because of patient's decision for unspecified reasons: Secondary | ICD-10-CM | POA: Insufficient documentation

## 2015-10-27 ENCOUNTER — Emergency Department
Admission: EM | Admit: 2015-10-27 | Discharge: 2015-10-27 | Disposition: A | Discharge status: Home / Self Care | Payer: Self-pay | Attending: Emergency Medicine | Admitting: Emergency Medicine

## 2015-10-27 ENCOUNTER — Emergency Department: Payer: Self-pay

## 2015-10-27 DIAGNOSIS — F17210 Nicotine dependence, cigarettes, uncomplicated: Secondary | ICD-10-CM | POA: Insufficient documentation

## 2015-10-27 DIAGNOSIS — J0191 Acute recurrent sinusitis, unspecified: Secondary | ICD-10-CM | POA: Insufficient documentation

## 2015-10-27 LAB — URINALYSIS
Bilirubin, UA: NEGATIVE
Blood, UA: NEGATIVE
Glucose, UA: NEGATIVE
Ketones UA: NEGATIVE
Leukocyte Esterase, UA: NEGATIVE
Nitrite, UA: NEGATIVE
Protein, UR: NEGATIVE
Specific Gravity UA: 1.015 (ref 1.001–1.035)
Urine pH: 6 (ref 5.0–8.0)
Urobilinogen, UA: NORMAL mg/dL (ref 0.2–2.0)

## 2015-10-27 LAB — COMPREHENSIVE METABOLIC PANEL
ALT: 18 U/L (ref 0–55)
AST (SGOT): 13 U/L (ref 5–34)
Albumin/Globulin Ratio: 1.4 (ref 0.9–2.2)
Albumin: 3.7 g/dL (ref 3.5–5.0)
Alkaline Phosphatase: 72 U/L (ref 37–106)
Anion Gap: 11 (ref 5.0–15.0)
BUN: 9 mg/dL (ref 7.0–19.0)
Bilirubin, Total: 0.4 mg/dL (ref 0.2–1.2)
CO2: 24 mEq/L (ref 22–29)
Calcium: 9.3 mg/dL (ref 8.5–10.5)
Chloride: 108 mEq/L (ref 100–111)
Creatinine: 0.8 mg/dL (ref 0.6–1.0)
Globulin: 2.7 g/dL (ref 2.0–3.6)
Glucose: 132 mg/dL — ABNORMAL HIGH (ref 70–100)
Potassium: 3.7 mEq/L (ref 3.5–5.1)
Protein, Total: 6.4 g/dL (ref 6.0–8.3)
Sodium: 143 mEq/L (ref 136–145)

## 2015-10-27 LAB — CBC AND DIFFERENTIAL
Absolute NRBC: 0 10*3/uL
Basophils Absolute Automated: 0.02 10*3/uL (ref 0.00–0.20)
Basophils Automated: 0.2 %
Eosinophils Absolute Automated: 0.14 10*3/uL (ref 0.00–0.70)
Eosinophils Automated: 1.3 %
Hematocrit: 42.8 % (ref 37.0–47.0)
Hgb: 13.8 g/dL (ref 12.0–16.0)
Immature Granulocytes Absolute: 0.04 10*3/uL
Immature Granulocytes: 0.4 %
Lymphocytes Absolute Automated: 3.72 10*3/uL (ref 0.50–4.40)
Lymphocytes Automated: 35.3 %
MCH: 26.2 pg — ABNORMAL LOW (ref 28.0–32.0)
MCHC: 32.2 g/dL (ref 32.0–36.0)
MCV: 81.2 fL (ref 80.0–100.0)
MPV: 9.7 fL (ref 9.4–12.3)
Monocytes Absolute Automated: 0.4 10*3/uL (ref 0.00–1.20)
Monocytes: 3.8 %
Neutrophils Absolute: 6.22 10*3/uL (ref 1.80–8.10)
Neutrophils: 59 %
Nucleated RBC: 0 /100 WBC (ref 0.0–1.0)
Platelets: 297 10*3/uL (ref 140–400)
RBC: 5.27 10*6/uL (ref 4.20–5.40)
RDW: 14 % (ref 12–15)
WBC: 10.54 10*3/uL (ref 3.50–10.80)

## 2015-10-27 LAB — GFR: EGFR: >60

## 2015-10-27 LAB — HCG QUANTITATIVE: hCG, Quant.: <1.2

## 2015-10-27 MED ORDER — SODIUM CHLORIDE 0.9 % IV BOLUS
1000.0000 mL | Freq: Once | INTRAVENOUS | Status: AC
Start: 2015-10-27 — End: 2015-10-27
  Administered 2015-10-27: 1000 mL via INTRAVENOUS

## 2015-10-27 MED ORDER — ACETAMINOPHEN 500 MG PO TABS
1000.0000 mg | ORAL_TABLET | Freq: Once | ORAL | Status: AC
Start: 2015-10-27 — End: 2015-10-27
  Administered 2015-10-27: 1000 mg via ORAL
  Filled 2015-10-27: qty 2

## 2015-10-27 MED ORDER — KETOROLAC TROMETHAMINE 30 MG/ML IJ SOLN
30.0000 mg | Freq: Once | INTRAMUSCULAR | Status: AC
Start: 2015-10-27 — End: 2015-10-27
  Administered 2015-10-27: 30 mg via INTRAVENOUS
  Filled 2015-10-27: qty 1

## 2015-10-27 NOTE — Discharge Instructions (Signed)
Sinusitis     You have been seen for a sinus infection.     Sinus infections are common. They often happen after people have a virus or a common cold.     Symptoms are: Pain in the face, green or yellow mucous from the nose, fever (temperature higher than 100.4ºF / 38ºC) and chills. There may also be a feeling of fullness or pressure in the face.     Decongestants may be used to help the sinuses drain. Sometimes a steroid spray is used.     You may need antibiotics for the infection. Not all cases of sinusitis need antibiotics. Viruses cause most sinusitis. Antibiotics do not work on viruses. Sometimes sinusitis and be caused by bacteria. These cases usually get better with medicine to help the sinuses drain. Antibiotics should be given only to patients who have symptoms for more than 2 weeks and if they have fever, severe sinus pain and pus mixed in with the mucus.     YOU SHOULD SEEK MEDICAL ATTENTION IMMEDIATELY, EITHER HERE OR AT THE NEAREST EMERGENCY DEPARTMENT, IF ANY OF THE FOLLOWING OCCURS:  · You do not get better with treatment.  · You have severe headaches and high fever (temperature higher than 100.4ºF / 38ºC) or any confusion.  · You have worse pain in the face. Your face gets more swollen.

## 2015-10-27 NOTE — ED Provider Notes (Signed)
Physician/Midlevel provider first contact with patient: 10/27/15 1651         History     Chief Complaint   Patient presents with   . Headache     Historian: Patient    Chief Complaint: Headache  Location: Right periorbital  Timing: 3 days  Severity: Moderate  Quality: Pressure  Modifying Factors: Nothing  Associated signs and symptoms: Nausea, photophobia, cough, congestion  Context:      HPI:  25 year old female G3 P2 A1 presenting with complaint of headache for the past 3 days.  States pain is right periorbital.  Reporting cough, congestion, photophobia.  Patient also checked pregnancy test and notes it to be positive.  States last menstrual period was October 12, 2015.  States that is her normal time.  No vaginal discharge or urinary symptoms.      Past Medical History   Diagnosis Date   . Childhood asthma    . Anxiety    . Depression    . Pollen allergies    . Other and unspecified ovarian cysts      left side   . HPV in female 2013     had cervical biopsies   . Postpartum depression      no meds   . Migraine    . Bacterial vaginitis        Past Surgical History   Procedure Laterality Date   . Tubes in ears         Family History   Problem Relation Age of Onset   . Drug abuse Father    . Drug abuse Maternal Aunt    . Alcohol abuse Maternal Uncle    . Depression Maternal Uncle    . Drug abuse Maternal Grandfather        Social  Social History   Substance Use Topics   . Smoking status: Current Every Day Smoker -- 0.50 packs/day for 15 years     Types: Cigarettes   . Smokeless tobacco: Never Used      Comment: trying to quit, has been smoking since age 43 up to 1 ppd   . Alcohol Use: Yes       .     Allergies   Allergen Reactions   . Amoxicillin Anaphylaxis   . Ceclor [Cefaclor] Anaphylaxis and Rash   . Macrodantin Anaphylaxis   . Penicillins Anaphylaxis   . Chocolate Nausea And Vomiting   . Flagyl [Metronidazole] Itching   . Percocet [Oxycodone-Acetaminophen] Nausea And Vomiting   . Sulfa Antibiotics Itching   .  Vicodin [Hydrocodone-Acetaminophen] Hives     Passed out     . Clindamycin Rash       Home Medications     Last Medication Reconciliation Action:  In Progress Catherine Duhamel, RN 10/27/2015  4:51 PM          No Medications           Review of Systems   Constitutional: Negative for fever and chills.   HENT: Positive for congestion and sinus pressure.    Eyes: Positive for photophobia. Negative for discharge.   Respiratory: Positive for cough. Negative for shortness of breath.    Cardiovascular: Negative for chest pain and palpitations.   Gastrointestinal: Positive for nausea. Negative for abdominal pain.   Genitourinary: Negative for dysuria, vaginal bleeding and vaginal discharge.   Musculoskeletal: Negative for back pain and neck pain.   Neurological: Positive for headaches.   All other systems reviewed and are  negative.      Physical Exam    BP: 143/88 mmHg, Heart Rate: 93, Temp: 97.4 F (36.3 C), Resp Rate: 16, SpO2: 98 %, Weight: 113.399 kg     Physical Exam   Constitutional: She is oriented to person, place, and time. She appears well-developed and well-nourished.   HENT:   Head: Normocephalic and atraumatic.   Mouth/Throat: Oropharynx is clear and moist.   Tender over the frontal, bilateral maxillary sinuses   Eyes: Conjunctivae and EOM are normal. Right eye exhibits no discharge. Left eye exhibits no discharge. No scleral icterus.   Neck: No JVD present. No tracheal deviation present.   Cardiovascular: Normal rate, regular rhythm and normal heart sounds.  Exam reveals no gallop and no friction rub.    No murmur heard.  Pulmonary/Chest: Effort normal and breath sounds normal. No stridor. No respiratory distress. She has no wheezes. She has no rales.   Abdominal: Soft. Bowel sounds are normal. She exhibits no distension and no mass. There is no tenderness. There is no rebound and no guarding.   Musculoskeletal: Normal range of motion. She exhibits no edema or tenderness.   Neurological: She is alert and  oriented to person, place, and time. She has normal strength. No sensory deficit. GCS eye subscore is 4. GCS verbal subscore is 5. GCS motor subscore is 6.   Skin: Skin is warm, dry and intact.   Psychiatric: She has a normal mood and affect. Her speech is normal and behavior is normal. Judgment and thought content normal.         MDM and ED Course     ED Medication Orders     Start Ordered     Status Ordering Provider    10/27/15 1815 10/27/15 1814  ketorolac (TORADOL) injection 30 mg   Once     Route: Intravenous  Ordered Dose: 30 mg     Last MAR action:  Given Catherine Spence    10/27/15 1730 10/27/15 1721  acetaminophen (TYLENOL) tablet 1,000 mg   Once     Route: Oral  Ordered Dose: 1,000 mg     Last MAR action:  Given Catherine Spence    10/27/15 1715 10/27/15 1704  sodium chloride 0.9 % bolus 1,000 mL   Once     Route: Intravenous  Ordered Dose: 1,000 mL     Last MAR action:  Stopped Catherine Spence             MDM  Number of Diagnoses or Management Options  Diagnosis management comments: I, Earline Mayotte MD, have been the primary provider for Catherine Spence during this Emergency Dept visit.      DDx includes, but is not limited to: Sinusitis, tension headache, pregnancy    Initial Plan: Labs, IV fluids, Tylenol      Patient felt better.  Results of labs discussed.  Patient understands to follow up with her primary care physician.    Results     Procedure Component Value Units Date/Time    hCG, Quantitative [[id number]] Collected:  10/27/15 1732     hCG, Quant. <1.2 Updated:  10/27/15 1812    UA with reflex to micro (pts  3 + yrs) [[id number]]  (Abnormal) Collected:  10/27/15 1745    Specimen Information:  Urine from Urine, Clean Catch Updated:  10/27/15 1803     Urine Type Clean Catch      Color, UA Yellow      Clarity, UA Cloudy (A)  Specific Gravity UA 1.015      Urine pH 6.0      Leukocyte Esterase, UA Negative      Nitrite, UA Negative      Protein, UR Negative      Glucose, UA Negative       Ketones UA Negative      Urobilinogen, UA Normal mg/dL      Bilirubin, UA Negative      Blood, UA Negative     Comprehensive metabolic panel [[id number]]  (Abnormal) Collected:  10/27/15 1732    Specimen Information:  Blood Updated:  10/27/15 1803     Glucose 132 (H) mg/dL      BUN 9.0 mg/dL      Creatinine 0.8 mg/dL      Sodium 621 mEq/L      Potassium 3.7 mEq/L      Chloride 108 mEq/L      CO2 24 mEq/L      Calcium 9.3 mg/dL      Protein, Total 6.4 g/dL      Albumin 3.7 g/dL      AST (SGOT) 13 U/L      ALT 18 U/L      Alkaline Phosphatase 72 U/L      Bilirubin, Total 0.4 mg/dL      Globulin 2.7 g/dL      Albumin/Globulin Ratio 1.4      Anion Gap 11.0     GFR [[id number]] Collected:  10/27/15 1732     EGFR >60.0 Updated:  10/27/15 1803    CBC with differential [[id number]]  (Abnormal) Collected:  10/27/15 1732    Specimen Information:  Blood from Blood Updated:  10/27/15 1742     WBC 10.54 x10 3/uL      Hgb 13.8 g/dL      Hematocrit 32.4 %      Platelets 297 x10 3/uL      RBC 5.27 x10 6/uL      MCV 81.2 fL      MCH 26.2 (L) pg      MCHC 32.2 g/dL      RDW 14 %      MPV 9.7 fL      Neutrophils 59.0 %      Lymphocytes Automated 35.3 %      Monocytes 3.8 %      Eosinophils Automated 1.3 %      Basophils Automated 0.2 %      Immature Granulocyte 0.4 %      Nucleated RBC 0.0 /100 WBC      Neutrophils Absolute 6.22 x10 3/uL      Abs Lymph Automated 3.72 x10 3/uL      Abs Mono Automated 0.40 x10 3/uL      Abs Eos Automated 0.14 x10 3/uL      Absolute Baso Automated 0.02 x10 3/uL      Absolute Immature Granulocyte 0.04 x10 3/uL      Absolute NRBC 0.00 x10 3/uL         Radiology Results (24 Hour)     ** No results found for the last 24 hours. **                Procedures    Clinical Impression & Disposition     Clinical Impression  Final diagnoses:   Acute recurrent sinusitis, unspecified location        ED Disposition     Discharge MASIAH LEWING discharge to home/self care.    Condition  at disposition: Stable              There are no discharge medications for this patient.                   Earline Mayotte, MD  10/27/15 1910

## 2015-10-27 NOTE — ED Notes (Signed)
headache, vomiting x 3 days +photophobia +pt states she is pregnant

## 2015-12-07 ENCOUNTER — Emergency Department
Admission: EM | Admit: 2015-12-07 | Discharge: 2015-12-07 | Disposition: A | Discharge status: Home / Self Care | Payer: Medicaid Other | Attending: Emergency Medicine | Admitting: Emergency Medicine

## 2015-12-07 ENCOUNTER — Emergency Department: Payer: Self-pay

## 2015-12-07 DIAGNOSIS — Z881 Allergy status to other antibiotic agents status: Secondary | ICD-10-CM | POA: Insufficient documentation

## 2015-12-07 DIAGNOSIS — Z880 Allergy status to penicillin: Secondary | ICD-10-CM | POA: Insufficient documentation

## 2015-12-07 DIAGNOSIS — F17210 Nicotine dependence, cigarettes, uncomplicated: Secondary | ICD-10-CM | POA: Insufficient documentation

## 2015-12-07 DIAGNOSIS — Z882 Allergy status to sulfonamides status: Secondary | ICD-10-CM | POA: Insufficient documentation

## 2015-12-07 DIAGNOSIS — B9689 Other specified bacterial agents as the cause of diseases classified elsewhere: Secondary | ICD-10-CM

## 2015-12-07 DIAGNOSIS — N760 Acute vaginitis: Secondary | ICD-10-CM | POA: Insufficient documentation

## 2015-12-07 LAB — URINALYSIS, REFLEX TO MICROSCOPIC EXAM IF INDICATED
Bilirubin, UA: NEGATIVE
Blood, UA: NEGATIVE
Glucose, UA: NEGATIVE
Ketones UA: NEGATIVE
Nitrite, UA: NEGATIVE
Protein, UR: NEGATIVE
Specific Gravity UA: 1.024 (ref 1.001–1.035)
Urine pH: 5 (ref 5.0–8.0)
Urobilinogen, UA: NORMAL mg/dL

## 2015-12-07 LAB — URINE HCG QUALITATIVE: Urine HCG Qualitative: NEGATIVE

## 2015-12-07 MED ORDER — METRONIDAZOLE 0.75 % VA GEL
Freq: Every day | VAGINAL | Status: AC
Start: 2015-12-07 — End: 2015-12-12

## 2015-12-07 NOTE — ED Notes (Signed)
Patient came in for malodor and yellow discharge from vagina. Patient denies all other symptoms. Patient has unprotected sex. Patient deneis weapons.

## 2015-12-11 NOTE — ED Provider Notes (Signed)
Physician/Midlevel provider first contact with patient: 12/07/15 2100         History     Chief Complaint   Patient presents with   . Vaginal Discharge     HPI Comments: Patient presents with 10 day history of a yellowish discharge from her vagina.  She states that it has an odor to it that is similar to when she had a "pH balance problem".  Patient does have unprotected sex with her boyfriend.  She does note that there has been a history of sexually transmitted infections and she has had chlamydia twice which she believes is from this boyfriend.  No complaints of vaginal bleeding or pain.  No fevers.  Symptoms are mild to moderate in intensity.  Patient denies any urinary symptoms    Patient is a 25 y.o. female presenting with vaginal discharge. The history is provided by the patient and medical records.   Vaginal Discharge           Past Medical History   Diagnosis Date   . Childhood asthma    . Anxiety    . Depression    . Pollen allergies    . Other and unspecified ovarian cysts      left side   . HPV in female 2013     had cervical biopsies   . Postpartum depression      no meds   . Migraine    . Bacterial vaginitis        Past Surgical History   Procedure Laterality Date   . Tubes in ears         Family History   Problem Relation Age of Onset   . Drug abuse Father    . Drug abuse Maternal Aunt    . Alcohol abuse Maternal Uncle    . Depression Maternal Uncle    . Drug abuse Maternal Grandfather        Social  Social History   Substance Use Topics   . Smoking status: Current Every Day Smoker -- 0.50 packs/day for 15 years     Types: Cigarettes   . Smokeless tobacco: Never Used      Comment: trying to quit, has been smoking since age 89 up to 1 ppd   . Alcohol Use: Yes       .     Allergies   Allergen Reactions   . Amoxicillin Anaphylaxis   . Ceclor [Cefaclor] Anaphylaxis and Rash   . Macrodantin Anaphylaxis   . Penicillins Anaphylaxis   . Chocolate Nausea And Vomiting   . Flagyl [Metronidazole] Itching   .  Percocet [Oxycodone-Acetaminophen] Nausea And Vomiting   . Sulfa Antibiotics Itching   . Vicodin [Hydrocodone-Acetaminophen] Hives     Passed out     . Clindamycin Rash       Home Medications     Last Medication Reconciliation Action:  In Progress Lowry Ram, RN 12/07/2015  8:56 PM          No Medications           Review of Systems   Genitourinary: Positive for vaginal discharge.   All other systems reviewed and are negative.      Physical Exam    BP: 121/59 mmHg, Heart Rate: 87, Temp: 97.8 F (36.6 C), Resp Rate: 16, SpO2: 99 %, Weight: 95.255 kg    Physical Exam   Constitutional: She is oriented to person, place, and time. She appears well-developed and well-nourished. No  distress.   HENT:   Head: Normocephalic and atraumatic.   Nose: Nose normal.   Eyes: Conjunctivae are normal. Right eye exhibits no discharge. Left eye exhibits no discharge. No scleral icterus.   Neck: Normal range of motion. Neck supple.   Pulmonary/Chest: Effort normal. No respiratory distress.   Genitourinary:   Normal external genitalia.  There is a small amount of whitish malodorous discharge.  Os is closed and no bleeding.  Cervix and adnexa are nontender   Musculoskeletal: Normal range of motion. She exhibits no edema.   Neurological: She is alert and oriented to person, place, and time.   Skin: Skin is warm and dry. She is not diaphoretic.   Psychiatric: She has a normal mood and affect. Her behavior is normal.   Nursing note and vitals reviewed.        MDM and ED Course   Pulse oximetry 99 percent on room air.  Interpretation normal.  No intervention necessary  Urinary tract infection versus pregnancy related versus sexually transmitted infection versus bacterial vaginosis versus yeast infection  Workup consistent with bacterial vaginosis.  Due to patient getting itchy to oral Flagyl we have decided to go with a MetroGel.  ED Medication Orders     None             MDM          Procedures    Clinical Impression & Disposition      Clinical Impression  Final diagnoses:   Bacterial vaginosis        ED Disposition     Discharge STALEY BUDZINSKI discharge to home/self care.    Condition at disposition: Stable             Discharge Medication List as of 12/07/2015 11:08 PM      START taking these medications    Details   metroNIDAZOLE (METROGEL) 0.75 % vaginal gel Place vaginally daily., Starting 12/07/2015, Until Sat 12/12/15, Print                         Etha Stambaugh, Saunders Glance, MD  12/11/15 [phone]

## 2016-02-06 ENCOUNTER — Emergency Department: Payer: Self-pay

## 2016-02-06 ENCOUNTER — Emergency Department: Payer: Medicaid Other

## 2016-02-06 ENCOUNTER — Emergency Department
Admission: EM | Admit: 2016-02-06 | Discharge: 2016-02-06 | Disposition: A | Discharge status: Home / Self Care | Payer: Medicaid Other | Attending: Emergency Medicine | Admitting: Emergency Medicine

## 2016-02-06 DIAGNOSIS — R6889 Other general symptoms and signs: Secondary | ICD-10-CM | POA: Insufficient documentation

## 2016-02-06 DIAGNOSIS — R0989 Other specified symptoms and signs involving the circulatory and respiratory systems: Secondary | ICD-10-CM

## 2016-02-06 DIAGNOSIS — F17210 Nicotine dependence, cigarettes, uncomplicated: Secondary | ICD-10-CM | POA: Insufficient documentation

## 2016-02-06 DIAGNOSIS — Z331 Pregnant state, incidental: Secondary | ICD-10-CM | POA: Insufficient documentation

## 2016-02-06 DIAGNOSIS — Z3490 Encounter for supervision of normal pregnancy, unspecified, unspecified trimester: Secondary | ICD-10-CM

## 2016-02-06 LAB — BASIC METABOLIC PANEL
Anion Gap: 8 (ref 5.0–15.0)
BUN: 13 mg/dL (ref 7.0–19.0)
CO2: 23 mEq/L (ref 22–29)
Calcium: 8.9 mg/dL (ref 8.5–10.5)
Chloride: 109 mEq/L (ref 100–111)
Creatinine: 0.8 mg/dL (ref 0.6–1.0)
Glucose: 89 mg/dL (ref 70–100)
Potassium: 4.1 mEq/L (ref 3.5–5.1)
Sodium: 140 mEq/L (ref 136–145)

## 2016-02-06 LAB — CBC AND DIFFERENTIAL
Absolute NRBC: 0 10*3/uL
Basophils Absolute Automated: 0.03 10*3/uL (ref 0.00–0.20)
Basophils Automated: 0.3 %
Eosinophils Absolute Automated: 0.28 10*3/uL (ref 0.00–0.70)
Eosinophils Automated: 2.4 %
Hematocrit: 40.7 % (ref 37.0–47.0)
Hgb: 13.4 g/dL (ref 12.0–16.0)
Immature Granulocytes Absolute: 0.03 10*3/uL
Immature Granulocytes: 0.3 %
Lymphocytes Absolute Automated: 4.28 10*3/uL (ref 0.50–4.40)
Lymphocytes Automated: 36.9 %
MCH: 26.9 pg — ABNORMAL LOW (ref 28.0–32.0)
MCHC: 32.9 g/dL (ref 32.0–36.0)
MCV: 81.6 fL (ref 80.0–100.0)
MPV: 9.7 fL (ref 9.4–12.3)
Monocytes Absolute Automated: 0.63 10*3/uL (ref 0.00–1.20)
Monocytes: 5.4 %
Neutrophils Absolute: 6.35 10*3/uL (ref 1.80–8.10)
Neutrophils: 54.7 %
Nucleated RBC: 0 /100 WBC (ref 0.0–1.0)
Platelets: 290 10*3/uL (ref 140–400)
RBC: 4.99 10*6/uL (ref 4.20–5.40)
RDW: 13 % (ref 12–15)
WBC: 11.6 10*3/uL — ABNORMAL HIGH (ref 3.50–10.80)

## 2016-02-06 LAB — GFR: EGFR: >60

## 2016-02-06 LAB — GROUP A STREP, RAPID ANTIGEN: Group A Strep, Rapid Antigen: NEGATIVE

## 2016-02-06 LAB — HCG, SERUM, QUALITATIVE: Hcg Qualitative: POSITIVE — AB

## 2016-02-06 MED ORDER — DIPHENHYDRAMINE HCL 50 MG/ML IJ SOLN
25.0000 mg | Freq: Once | INTRAMUSCULAR | Status: DC
Start: 2016-02-06 — End: 2016-02-06
  Filled 2016-02-06: qty 1

## 2016-02-06 MED ORDER — METHYLPREDNISOLONE SODIUM SUCC 125 MG IJ SOLR
125.0000 mg | Freq: Once | INTRAMUSCULAR | Status: DC
Start: 2016-02-06 — End: 2016-02-06
  Filled 2016-02-06: qty 2

## 2016-02-06 MED ORDER — FAMOTIDINE 10 MG/ML IV SOLN (WRAP)
20.0000 mg | Freq: Once | INTRAVENOUS | Status: DC
Start: 2016-02-06 — End: 2016-02-06
  Filled 2016-02-06: qty 2

## 2016-02-06 MED ORDER — SODIUM CHLORIDE 0.9 % IV BOLUS
1000.0000 mL | Freq: Once | INTRAVENOUS | Status: DC
Start: 2016-02-06 — End: 2016-02-06

## 2016-02-06 NOTE — ED Notes (Signed)
Pt refusing medications and imaging because she "feels much better" and "doesn't want to jeopardize anything". Dr. Drue Second informed.

## 2016-02-06 NOTE — ED Triage Notes (Signed)
Patient woke up 40 minutes ago and was having trouble swallowing. Had foods she normally does today, nothing unusual. Having throat tightness. Feels like she can't swallow water. Itches in throat. Denies weapons

## 2016-02-06 NOTE — ED Provider Notes (Signed)
Physician/Midlevel provider first contact with patient: 02/06/16 1620         History     Chief Complaint   Patient presents with   . Allergic Reaction     HPI     Patient Name: Catherine Spence, Catherine Spence, 25 y.o., female      DR. Fanta Wimberley  is the primary attending for this patient and performed the HPI, PE, and medical decision making for this patient.    History obtained by: patient  Chief Complaint/Onset/Duration/Quality/Location/Severity/Context: Patient presents with a feeling of throat tightness starting 30 minutes after waking up from a nap.  She states earlier today she had [employer] and aortic least doughnut.  Denies any syncope, rash although she states she has some itchiness in the frontal aspect of her neck.  Aggravating Factors/Alleviating Factors: Aggravated by unknown. Alleviated by nothing taken.  Associated Symptoms: Denies any stridor, chest pain, shortness of breath, syncope, rash.        *This note was generated by the Epic EMR system/ Dragon speech recognition and may contain inherent errors or omissions not intended by the user. Grammatical errors, random word insertions, deletions, pronoun errors and incomplete sentences are occasional consequences of this technology due to software limitations. Not all errors are caught or corrected. If there are questions or concerns about the content of this note or information contained within the body of this dictation they should be addressed directly with the author for clarification          Past Medical History:   Diagnosis Date   . Anxiety    . Bacterial vaginitis    . Childhood asthma    . Depression    . HPV in female 2013    had cervical biopsies   . Migraine    . Other and unspecified ovarian cysts     left side   . Pollen allergies    . Postpartum depression     no meds       Past Surgical History:   Procedure Laterality Date   . tubes in ears         Family History   Problem Relation Age of Onset   . Drug abuse Father    . Drug abuse Maternal Aunt     . Alcohol abuse Maternal Uncle    . Depression Maternal Uncle    . Drug abuse Maternal Grandfather        Social  Social History   Substance Use Topics   . Smoking status: Current Every Day Smoker     Packs/day: 0.50     Years: 15.00     Types: Cigarettes   . Smokeless tobacco: Never Used      Comment: trying to quit, has been smoking since age 25 up to 1 ppd   . Alcohol use Yes       .     Allergies   Allergen Reactions   . Amoxicillin Anaphylaxis   . Ceclor [Cefaclor] Anaphylaxis and Rash   . Macrodantin Anaphylaxis   . Penicillins Anaphylaxis   . Chocolate Nausea And Vomiting   . Flagyl [Metronidazole] Itching   . Percocet [Oxycodone-Acetaminophen] Nausea And Vomiting   . Sulfa Antibiotics Itching   . Vicodin [Hydrocodone-Acetaminophen] Hives     Passed out     . Clindamycin Rash       Home Medications     Med List Status:  In Progress Set By: Thomasene Ripple, RN at 02/06/2016  4:28 PM  No Medications           Review of Systems   Constitutional: Negative for fever.   HENT: Positive for trouble swallowing. Negative for sinus pressure and sore throat.    Respiratory: Negative for choking, shortness of breath, wheezing and stridor.    Cardiovascular: Negative for chest pain.   Gastrointestinal: Negative for abdominal pain, nausea and vomiting.   Musculoskeletal: Negative for neck pain.   Skin: Negative for pallor and rash.   Neurological: Negative for syncope and headaches.   All other systems reviewed and are negative.      Physical Exam    BP: 115/83, Heart Rate: 84, Temp: 98.4 F (36.9 C), Resp Rate: 20, SpO2: 98 %, Weight: 90.7 kg    Physical Exam   Constitutional: She is oriented to person, place, and time. She appears distressed.   HENT:   Head: Normocephalic and atraumatic.   Mouth/Throat: Oropharynx is clear and moist.   No oropharyngeal swelling, tongue swelling, lip swelling, facial swelling. phonation is normal.  No stridor or drooling present.  No rash appreciated in the face.   Eyes: Conjunctivae  and EOM are normal.   Neck: Normal range of motion. Neck supple.   Cardiovascular: Normal rate, regular rhythm, normal heart sounds and intact distal pulses.    Pulmonary/Chest: Effort normal and breath sounds normal. No respiratory distress.   Abdominal: Soft. Bowel sounds are normal. She exhibits no distension. There is no tenderness. There is no CVA tenderness.   Musculoskeletal: Normal range of motion. She exhibits no edema.   Neurological: She is alert and oriented to person, place, and time. She has normal strength. No cranial nerve deficit. Coordination normal.   Skin: Skin is warm and dry. No rash noted.   Nursing note and vitals reviewed.        MDM and ED Course     ED Medication Orders     Start Ordered     Status Ordering Provider    02/06/16 1628 02/06/16 1627  methylPREDNISolone sodium succinate (Solu-MEDROL) injection 125 mg  Once     Route: Intravenous  Ordered Dose: 125 mg     Last MAR action:  Not Given Helayne Seminole    02/06/16 1628 02/06/16 1627  famotidine (PEPCID) injection 20 mg  Once     Route: Intravenous  Ordered Dose: 20 mg     Last MAR action:  Not Given Helayne Seminole    02/06/16 1628 02/06/16 1627  diphenhydrAMINE (BENADRYL) injection 25 mg  Once     Route: Intravenous  Ordered Dose: 25 mg     Last MAR action:  Not Given Helayne Seminole    02/06/16 1628 02/06/16 1627  sodium chloride 0.9 % bolus 1,000 mL  Once     Route: Intravenous  Ordered Dose: 1,000 mL     Last MAR action:  Not Given Helayne Seminole             MDM  Number of Diagnoses or Management Options  Diagnosis management comments: Oxygen saturation by pulse oximetry is 95%-100%, Normal.  Interventions: None Needed.    Attending Dr. Helayne Seminole'         Amount and/or Complexity of Data Reviewed  Clinical lab tests: ordered and reviewed  Tests in the radiology section of CPT: ordered and reviewed  Independent visualization of images, tracings, or specimens: yes    Risk of Complications, Morbidity, and/or  Mortality  Presenting problems: high  Diagnostic procedures: high  Management options:  high          ED Course             Patient course and reassessment prior to discharge : stable/ no distress. Refuses xray/all meds, all outpatient meds. Wants to go home. In no distress. Stable for op f/u.     Counseled pt about diagnosis and/or relevant lab/radiology results if any present. Recommended immediate ED visit for any new or worsening problems or concerns. Patient expressed understanding and agreement with discharge plan. Well appearing at discharge. Close and timed follow up recommended.    Results     Procedure Component Value Units Date/Time    Basic Metabolic Panel [[id number]] Collected:  02/06/16 1640    Specimen:  Blood Updated:  02/06/16 1706     Glucose 89 mg/dL      BUN 40.9 mg/dL      Creatinine 0.8 mg/dL      Calcium 8.9 mg/dL      Sodium 811 mEq/L      Potassium 4.1 mEq/L      Chloride 109 mEq/L      CO2 23 mEq/L      Anion Gap 8.0    GFR [[id number]] Collected:  02/06/16 1640     Updated:  02/06/16 1706     EGFR >60.0    Rapid Strep (Group A Antigen) [[id number]] Collected:  02/06/16 1640    Specimen:  Throat Updated:  02/06/16 1658     Group A Strep, Rapid Antigen Negative    hCG, Qualitative (Pos/Neg) [[id number]]  (Abnormal) Collected:  02/06/16 1640    Specimen:  Blood Updated:  02/06/16 1658     Hcg Qualitative Positive (A)    CBC with differential [[id number]]  (Abnormal) Collected:  02/06/16 1640    Specimen:  Blood from Blood Updated:  02/06/16 1649     WBC 11.60 (H) x10 3/uL      Hgb 13.4 g/dL      Hematocrit 27.2 %      Platelets 290 x10 3/uL      RBC 4.99 x10 6/uL      MCV 81.6 fL      MCH 26.9 (L) pg      MCHC 32.9 g/dL      RDW 13 %      MPV 9.7 fL      Neutrophils 54.7 %      Lymphocytes Automated 36.9 %      Monocytes 5.4 %      Eosinophils Automated 2.4 %      Basophils Automated 0.3 %      Immature Granulocyte 0.3 %      Nucleated RBC 0.0 /100 WBC      Neutrophils Absolute 6.35 x10 3/uL       Abs Lymph Automated 4.28 x10 3/uL      Abs Mono Automated 0.63 x10 3/uL      Abs Eos Automated 0.28 x10 3/uL      Absolute Baso Automated 0.03 x10 3/uL      Absolute Immature Granulocyte 0.03 x10 3/uL      Absolute NRBC 0.00 x10 3/uL           Radiology Results (24 Hour)     ** No results found for the last 24 hours. **            Procedures    Clinical Impression & Disposition     Clinical Impression  Final diagnoses:   Pregnancy, unspecified gestational age  Throat tightness        ED Disposition     ED Disposition Condition Date/Time Comment    Discharge  Sat Feb 06, 2016  5:46 PM Micheline Maze discharge to home/self care.    Condition at disposition: Stable           New Prescriptions    No medications on file                 Helayne Seminole, MD  02/06/16 [phone]

## 2016-02-06 NOTE — Discharge Instructions (Signed)
Pregnancy     It has been determined you are pregnant.     You originally came here for another problem. However, pregnancy will affect how we evaluate and treat your medical condition.     You have been referred to an obstetrician (OB doctor). Call to make your appointment today. Close follow-up with an OB doctor is very important for you and your baby.     Get follow-up care as soon as possible, certainly within the next few days. Inform your doctor or obstetrician of your visit here and your pregnancy.     If you smoke, stop. This is necessary to protect yourself and the health of your fetus (developing baby). Smoking makes miscarriage (losing your pregnancy) more likely.     Abusing alcohol or drugs will hurt the fetus (developing baby). It may cause a miscarriage, abnormal development or premature birth. It can also cause severe health or mental problems after birth. Avoid these substances from now until the end of your pregnancy.     If taking pain medicines, antipsychotics, antidepressants or seizure medicine, check with your doctor as soon as possible. Ask if you should continue with these medicines.     Return here or go to the nearest Emergency Department right away if you have: Pain or cramping in the abdomen (belly) or pelvis, vaginal bleeding, nausea/vomiting, dizziness/lightheadedness or passing out.     Keep yourself well-hydrated at all times. Drink a lot of fluids and eat a balanced, healthy, nutritious diet.     Take prenatal vitamins every day. These may be prescribed for you on this visit. You may need to get them from the follow-up care provider. You can also get less expensive kinds over the counter (without a prescription). Ask you pharmacist for details.

## 2016-02-09 ENCOUNTER — Emergency Department: Payer: Self-pay

## 2016-02-09 ENCOUNTER — Emergency Department: Payer: Medicaid Other

## 2016-02-09 ENCOUNTER — Emergency Department
Admission: EM | Admit: 2016-02-09 | Discharge: 2016-02-10 | Disposition: A | Discharge status: Home / Self Care | Payer: Medicaid Other | Attending: Emergency Medicine | Admitting: Emergency Medicine

## 2016-02-09 DIAGNOSIS — F17210 Nicotine dependence, cigarettes, uncomplicated: Secondary | ICD-10-CM | POA: Insufficient documentation

## 2016-02-09 DIAGNOSIS — O3481 Maternal care for other abnormalities of pelvic organs, first trimester: Secondary | ICD-10-CM | POA: Insufficient documentation

## 2016-02-09 DIAGNOSIS — N83202 Unspecified ovarian cyst, left side: Secondary | ICD-10-CM | POA: Insufficient documentation

## 2016-02-09 DIAGNOSIS — O26891 Other specified pregnancy related conditions, first trimester: Secondary | ICD-10-CM | POA: Insufficient documentation

## 2016-02-09 DIAGNOSIS — R102 Pelvic and perineal pain: Secondary | ICD-10-CM | POA: Insufficient documentation

## 2016-02-09 DIAGNOSIS — N83201 Unspecified ovarian cyst, right side: Secondary | ICD-10-CM | POA: Insufficient documentation

## 2016-02-09 DIAGNOSIS — Z3A00 Weeks of gestation of pregnancy not specified: Secondary | ICD-10-CM | POA: Insufficient documentation

## 2016-02-09 DIAGNOSIS — O99331 Smoking (tobacco) complicating pregnancy, first trimester: Secondary | ICD-10-CM | POA: Insufficient documentation

## 2016-02-09 LAB — CBC AND DIFFERENTIAL
Absolute NRBC: 0 10*3/uL
Basophils Absolute Automated: 0.01 10*3/uL (ref 0.00–0.20)
Basophils Automated: 0.1 %
Eosinophils Absolute Automated: 0.21 10*3/uL (ref 0.00–0.70)
Eosinophils Automated: 1.8 %
Hematocrit: 37.7 % (ref 37.0–47.0)
Hgb: 12.3 g/dL (ref 12.0–16.0)
Immature Granulocytes Absolute: 0.03 10*3/uL
Immature Granulocytes: 0.3 %
Lymphocytes Absolute Automated: 3.75 10*3/uL (ref 0.50–4.40)
Lymphocytes Automated: 32.4 %
MCH: 26.9 pg — ABNORMAL LOW (ref 28.0–32.0)
MCHC: 32.6 g/dL (ref 32.0–36.0)
MCV: 82.5 fL (ref 80.0–100.0)
MPV: 9.5 fL (ref 9.4–12.3)
Monocytes Absolute Automated: 0.75 10*3/uL (ref 0.00–1.20)
Monocytes: 6.5 %
Neutrophils Absolute: 6.81 10*3/uL (ref 1.80–8.10)
Neutrophils: 58.9 %
Nucleated RBC: 0 /100 WBC (ref 0.0–1.0)
Platelets: 266 10*3/uL (ref 140–400)
RBC: 4.57 10*6/uL (ref 4.20–5.40)
RDW: 13 % (ref 12–15)
WBC: 11.56 10*3/uL — ABNORMAL HIGH (ref 3.50–10.80)

## 2016-02-09 LAB — COMPREHENSIVE METABOLIC PANEL
ALT: 19 U/L (ref 0–55)
AST (SGOT): 13 U/L (ref 5–34)
Albumin/Globulin Ratio: 1.3 (ref 0.9–2.2)
Albumin: 3.3 g/dL — ABNORMAL LOW (ref 3.5–5.0)
Alkaline Phosphatase: 55 U/L (ref 37–106)
Anion Gap: 9 (ref 5.0–15.0)
BUN: 14 mg/dL (ref 7.0–19.0)
Bilirubin, Total: 0.2 mg/dL (ref 0.2–1.2)
CO2: 23 mEq/L (ref 22–29)
Calcium: 8.5 mg/dL (ref 8.5–10.5)
Chloride: 110 mEq/L (ref 100–111)
Creatinine: 0.7 mg/dL (ref 0.6–1.0)
Globulin: 2.5 g/dL (ref 2.0–3.6)
Glucose: 82 mg/dL (ref 70–100)
Potassium: 3.9 mEq/L (ref 3.5–5.1)
Protein, Total: 5.8 g/dL — ABNORMAL LOW (ref 6.0–8.3)
Sodium: 142 mEq/L (ref 136–145)

## 2016-02-09 LAB — LIPASE: Lipase: 47 U/L (ref 8–78)

## 2016-02-09 LAB — HCG QUANTITATIVE: hCG, Quant.: 266.8 — ABNORMAL HIGH

## 2016-02-09 LAB — GFR: EGFR: >60

## 2016-02-09 MED ORDER — SODIUM CHLORIDE 0.9 % IV BOLUS
1000.0000 mL | Freq: Once | INTRAVENOUS | Status: AC
Start: 2016-02-09 — End: 2016-02-10
  Administered 2016-02-09: 1000 mL via INTRAVENOUS

## 2016-02-09 NOTE — ED Provider Notes (Signed)
Physician/Midlevel provider first contact with patient: 02/09/16 2109         History     Chief Complaint   Patient presents with   . Abdominal Pain   . Nausea   . Emesis   . Diarrhea   . Emesis During Pregnancy     Historian: Patient    Chief Complaint: Left lower quadrant pain  Location: Left lower quadrant  Timing: Yesterday  Severity: Mild-to-moderate  Quality:  sharp  Modifying Factors: None  Associated signs and symptoms: Nausea, vomiting, diarrhea  Context:      HPI:  25 year old female G4 P2 A 1 presenting with complaint of lower quadrant abdominal pain with positive pregnancy test during most recent ER visit.  Last menstrual period was January 18, 2016.  Reporting nausea, vomiting, diarrhea.  Says diarrhea was present for past 2 weeks.  Nausea and vomiting presently for last 3 days.  Does report dysuria and frequency.           Past Medical History:   Diagnosis Date   . Anxiety    . Bacterial vaginitis    . Childhood asthma    . Depression    . HPV in female 2013    had cervical biopsies   . Migraine    . Other and unspecified ovarian cysts     left side   . Pollen allergies    . Postpartum depression     no meds       Past Surgical History:   Procedure Laterality Date   . tubes in ears         Family History   Problem Relation Age of Onset   . Drug abuse Father    . Drug abuse Maternal Aunt    . Alcohol abuse Maternal Uncle    . Depression Maternal Uncle    . Drug abuse Maternal Grandfather        Social  Social History   Substance Use Topics   . Smoking status: Current Every Day Smoker     Packs/day: 0.50     Years: 15.00     Types: Cigarettes   . Smokeless tobacco: Never Used      Comment: trying to quit, has been smoking since age 16 up to 1 ppd   . Alcohol use Yes       .     Allergies   Allergen Reactions   . Amoxicillin Anaphylaxis   . Ceclor [Cefaclor] Anaphylaxis and Rash   . Macrodantin Anaphylaxis   . Penicillins Anaphylaxis   . Chocolate Nausea And Vomiting   . Flagyl [Metronidazole] Itching    . Percocet [Oxycodone-Acetaminophen] Nausea And Vomiting   . Sulfa Antibiotics Itching   . Vicodin [Hydrocodone-Acetaminophen] Hives     Passed out     . Clindamycin Rash       Home Medications     Med List Status:  In Progress Set By: Manuella Ghazi, RN at 02/09/2016  9:06 PM        No Medications           Review of Systems   Respiratory: Negative for cough and shortness of breath.    Cardiovascular: Negative for chest pain and palpitations.   Gastrointestinal: Positive for abdominal pain, diarrhea, nausea and vomiting.   Genitourinary: Positive for dysuria and frequency. Negative for vaginal bleeding and vaginal discharge.   All other systems reviewed and are negative.      Physical Exam  BP: 128/57, Heart Rate: 76, Temp: 98.1 F (36.7 C), Resp Rate: 18, SpO2: 100 %, Weight: 90.7 kg     Physical Exam   Constitutional: She is oriented to person, place, and time. She appears well-developed and well-nourished.   HENT:   Head: Normocephalic and atraumatic.   Eyes: Conjunctivae and EOM are normal. Right eye exhibits no discharge. Left eye exhibits no discharge. No scleral icterus.   Neck: No JVD present. No tracheal deviation present.   Cardiovascular: Normal rate, regular rhythm and normal heart sounds.  Exam reveals no gallop and no friction rub.    No murmur heard.  Pulmonary/Chest: Effort normal and breath sounds normal. No stridor. No respiratory distress. She has no wheezes. She has no rales.   Abdominal: Soft. Bowel sounds are normal. She exhibits no distension and no mass. There is no tenderness. There is no rebound and no guarding.   Genitourinary: Cervix exhibits no motion tenderness and no discharge. Right adnexum displays no tenderness and no fullness. Left adnexum displays tenderness. Left adnexum displays no fullness. No bleeding in the vagina.   Musculoskeletal: Normal range of motion. She exhibits no edema or tenderness.   Neurological: She is alert and oriented to person, place, and time. She  has normal strength. No sensory deficit. GCS eye subscore is 4. GCS verbal subscore is 5. GCS motor subscore is 6.   Skin: Skin is warm, dry and intact.   Psychiatric: She has a normal mood and affect. Her speech is normal and behavior is normal. Judgment and thought content normal.         MDM and ED Course     ED Medication Orders     Start Ordered     Status Ordering Provider    02/09/16 2300 02/09/16 2253  sodium chloride 0.9 % bolus 1,000 mL  Once     Route: Intravenous  Ordered Dose: 1,000 mL     Last MAR action:  Stopped MALACHIAS, ZACHARY    02/09/16 2115 02/09/16 2113  sodium chloride 0.9 % bolus 1,000 mL  Once     Route: Intravenous  Ordered Dose: 1,000 mL     Last MAR action:  Stopped Mikiala Fugett             MDM  Number of Diagnoses or Management Options  Diagnosis management comments: I, Earline Mayotte MD, have been the primary provider for Catherine Spence during this Emergency Dept visit.      DDx includes, but is not limited to: Ovarian cyst, enteritis, ectopic    Initial Plan: Labs, ultrasound, IV fluids          ED Course        Results     Procedure Component Value Units Date/Time    hCG, Quantitative [[id number]]  (Abnormal) Collected:  02/09/16 2126     Updated:  02/09/16 2153     hCG, Sharene Butters. 266.8 Ernesta Amble PREP TRICHOMONAS [[id number]] Collected:  02/09/16 2147    Specimen:  Vaginal Swab Updated:  02/09/16 2153    Narrative:       ORDER#: [id number]                                    ORDERED BY: Tiajuana Amass  SOURCE: Vaginal Swab  COLLECTED:  02/09/16 21:47  ANTIBIOTICS AT COLL.:                                RECEIVED :  02/09/16 21:50  Wet Prep Trichomonas                       FINAL       02/09/16 21:53  02/09/16   No WBCs Seen             No Trichomonas Seen             No Yeast/Fungal Elements Seen             No Clue Cells Seen             Reference Range: No Trichomonas or Yeast Seen      GFR [[id number]] Collected:  02/09/16 2126     Updated:   02/09/16 2148     EGFR >60.0    Comprehensive metabolic panel [[id number]]  (Abnormal) Collected:  02/09/16 2126    Specimen:  Blood Updated:  02/09/16 2148     Glucose 82 mg/dL      BUN 78.2 mg/dL      Creatinine 0.7 mg/dL      Sodium 956 mEq/L      Potassium 3.9 mEq/L      Chloride 110 mEq/L      CO2 23 mEq/L      Calcium 8.5 mg/dL      Protein, Total 5.8 (L) g/dL      Albumin 3.3 (L) g/dL      AST (SGOT) 13 U/L      ALT 19 U/L      Alkaline Phosphatase 55 U/L      Bilirubin, Total 0.2 mg/dL      Globulin 2.5 g/dL      Albumin/Globulin Ratio 1.3     Anion Gap 9.0    Lipase [[id number]] Collected:  02/09/16 2126    Specimen:  Blood Updated:  02/09/16 2148     Lipase 47 U/L     CBC with differential [[id number]]  (Abnormal) Collected:  02/09/16 2126    Specimen:  Blood from Blood Updated:  02/09/16 2131     WBC 11.56 (H) x10 3/uL      Hgb 12.3 g/dL      Hematocrit 41.3 %      Platelets 266 x10 3/uL      RBC 4.57 x10 6/uL      MCV 82.5 fL      MCH 26.9 (L) pg      MCHC 32.6 g/dL      RDW 13 %      MPV 9.5 fL      Neutrophils 58.9 %      Lymphocytes Automated 32.4 %      Monocytes 6.5 %      Eosinophils Automated 1.8 %      Basophils Automated 0.1 %      Immature Granulocyte 0.3 %      Nucleated RBC 0.0 /100 WBC      Neutrophils Absolute 6.81 x10 3/uL      Abs Lymph Automated 3.75 x10 3/uL      Abs Mono Automated 0.75 x10 3/uL      Abs Eos Automated 0.21 x10 3/uL      Absolute Baso Automated 0.01 x10 3/uL      Absolute Immature  Granulocyte 0.03 x10 3/uL      Absolute NRBC 0.00 x10 3/uL         Radiology Results (24 Hour)     Procedure Component Value Units Date/Time    US OB < 14 Weeks with Wilhelmina Mcardle [[id number]] Collected:  02/09/16 2256    Order Status:  Completed Updated:  02/09/16 2308    Narrative:       History:  25 year old female with pain. Positive pregnancy test. LMP  01/18/2016.    FINDINGS:  The pelvis was scanned transabdominally and endovaginally. Duplex  Doppler vascular evaluation was performed of the  arterial and venous  flow to the ovaries. Exam compared prior sonogram 03/07/2015.    FINDINGS:  The uterus is normal in size measuring 9.1 x 5.1 x 6.4 cm. The double  layer endometrial thickness measures 2.0 cm. There is no detectable  intrauterine gestational sac at this time.    The right ovary is enlarged and contains several cysts. Overall right  ovary size measures 7.1 x 6.8 x 5.1 cm. Largest individual right ovarian  cyst measures 5.2 x 4.6 x 4.2 cm. The left ovary is normal size at 3.5 x  2.1 x 3.0 cm, with an adjacent 3.0 x 1.4 x 2.5 cm paraovarian cyst.  Normal arterial and venous flow is seen to each ovary. There is no free  fluid in the cul-de-sac.      Impression:         1. No sonographically detectable intrauterine pregnancy at this time,  consistent with early gestational age. Follow-up needed to assure  viability.  2. Enlarged right ovary with several cysts, similar to previous  sonogram.  3. No detectable ovarian torsion.  4. Remainder as above.    Wilmon Pali, MD   02/09/2016 11:04 PM                Procedures    Clinical Impression & Disposition     Clinical Impression  Final diagnoses:   Pelvic pain affecting pregnancy in first trimester, antepartum   Cysts of both ovaries        ED Disposition     ED Disposition Condition Date/Time Comment    Discharge  Wed Feb 10, 2016 12:04 AM Catherine Spence discharge to home/self care.    Condition at disposition: Stable           There are no discharge medications for this patient.                Earline Mayotte, MD  02/11/16 [phone]

## 2016-02-09 NOTE — ED Triage Notes (Signed)
pt ambulatory into ed. reports finding out on friday that she is pregnant, does not know how many weeks. reports yesterday having sharp LLQ pain. reports also having N/V/D since yesterday. states her OB is out of town and wanted her assessed for ectopic. no other reported symptoms.

## 2016-02-10 NOTE — Discharge Instructions (Signed)
Ovarian Cyst    You have been diagnosed with an ovarian cyst.    Ovarian cysts are common in women with abdominal (belly) pain, pelvic pain or cramping.    Ovarian cysts are like small bubbles filled with fluid. These cysts are also called "ovarian follicles." They form in the ovary during a normal menstrual cycle (period). During your cycle, an egg ripens inside the cyst. It is getting ready for ovulation. Sometimes a follicle gets large or many follicles form. This can cause pelvic pain or cramps.    If the cyst gets too big, it may break open. When this happens, it releases blood and fluid into the abdomen (belly). This causes sudden severe pain. The pain often gets better within a few hours with no treatment. Some people need pain medicine.    A few women have a condition called polycystic ovarian disease. In these women, several cysts form on both ovaries during the menstrual cycle. This causes irregular (unpredictable) menstrual periods. This condition is caused by hormones.    To diagnose polycystic ovarian disease, a doctor usually examines a woman's pelvis and asks questions about her symptoms. If the doctor cannot feel the cysts with his or her hands, an ultrasound may be needed.    Emergency ultrasound testing for cysts is rarely needed. Instead, the test is usually during regular weekday office hours in the radiology department. The medical staff will schedule the test in the next few days. You may also see your regular doctor instead to schedule the ultrasound.    The doctor has decided it is OK for you to go home.    If symptoms signal complications like infection or bleeding, you may need to come here or to the nearest Emergency Department.    Follow up with your gynecologist or regular doctor in the next few days. Tell your doctor about this visit.   If you do not have a doctor or clinic to follow up with, tell the medical staff before leaving today. We can help make the arrangements for  you.    YOU SHOULD SEEK MEDICAL ATTENTION IMMEDIATELY, EITHER HERE OR AT THE NEAREST EMERGENCY DEPARTMENT, IF ANY OF THE FOLLOWING OCCURS:   You have worse pain in the abdomen (belly), pelvis or back.   More and more vaginal discharge or bleeding, or passing large blood clots.   Fever (temperature higher than 100.88F / 38C), chills, nausea, vomiting (throwing up).   Feeling dizzy, lightheaded or passing out.            Ectopic Pregnancy, Suspected    You have been seen because you are pregnant. We CANNOT yet be sure your pregnancy is in the uterus. Therefore, the doctor worries you may have an ectopic (tubal) pregnancy.    In an ectopic (tubal) pregnancy, the embryo (baby) implants in one of the fallopian tubes. The fallopian tubes lead from the ovaries to the uterus. Usually, an embryo grows in the uterus. Sometimes, an embryo starts to grow inside the tube or ovary instead. As it grows bigger, it may cause the fallopian tube to burst. This type of pregnancy CANNOT survive and must be removed from the tube. There may be severe complications if the pregnancy is not removed. This can include death of the pregnant mother. Some of your symptoms suggest you might have an ectopic (tubal) pregnancy. These include abdominal (belly) pain and/or vaginal bleeding.    If a pregnant woman has lower abdomen (belly) pain (with or without vaginal  bleeding), the doctor tries to make sure the pregnancy is in the uterus. Sometimes this is easy to do. An ultrasound often shows where the baby is. This can be as early along as six weeks into the pregnancy. If an ultrasound DOES NOT show a pregnancy in the uterus, there are 2 possibilities:    - First: the pregnancy is not far enough along and is too small to be seen by the ultrasound. This is the most common possibility.    - Second, the pregnancy IS NOT in the uterus. Instead, it is in the tubes or another abnormal place.      When the baby is not seen on the first  ultrasound, most doctors order a special blood test. This test is called a "quantitative hCG," or simply a "Quant." This test shows how much pregnancy hormone (hCG) is in the blood stream. It also helps to estimate the baby s age. These test results and the ultrasound findings will help the doctor decide about repeat tests. They will also help the doctor decide on follow-up with an OB/GYN doctor.    The exams and testing you had today show you are pregnant. However, it is not certain your pregnancy is in the uterus. It is still possible you have an ectopic (tubal) pregnancy. More testing is needed. These tests are to be sure your pregnancy is normal.    The doctor has decided it is OK for you to go home now. However, it is EXTREMELY IMPORTANT to follow up with the OB/GYN or referral doctor as planned.   You need another blood draw within 48 hours. This is to measure your blood pregnancy hormone level. This hormone is Beta-HCG. This helps see what is going on with your pregnancy. Have this test done exactly as directed.   You are either scheduled for an ultrasound of your uterus, or your OB/GYN doctor will schedule one.    YOU SHOULD SEEK MEDICAL ATTENTION IMMEDIATELY, EITHER HERE OR AT THE NEAREST EMERGENCY DEPARTMENT, IF ANY OF THE FOLLOWING OCCURS:   You have pain in the abdomen (belly), pelvis or back that gets worse.   You have more vaginal bleeding, soaking pads/tampons (more than one pad per hour), or you are passing large clots or fetal tissue.   You are dizzy, lightheaded or fainting.   You have nausea (sick to your stomach), vomiting (throwing up), fever (temperature higher than 100.76F / 38C) or chills.

## 2016-02-10 NOTE — ED Notes (Signed)
Christine LPN walked pt to bathroom for urine sample. Per Wynona Canes, pt came out of bathroom stating that while she was applying the lid to the urine cup, she spilled the sample. Pt educated that we will still need a sample.

## 2016-02-10 NOTE — ED Notes (Addendum)
Brittani CT asked pt to provide urine sample again. She states pt got up to bathroom, but did not provide sample. IV fluids still running. 2nd bag has been infusing.

## 2016-02-10 NOTE — ED Notes (Signed)
Answered call bell. Pt stated that she wanted to leave. Informed pt that MD was still in need of a urine sample. Pt acknowledged this but said she needed to leave. Dr Claiborne Rigg informed of pt's desire to leave without a urine sample.

## 2016-02-10 NOTE — ED Notes (Signed)
Pt asked again to provide urine sample, pt stated she was unable to go at this time. IV fluids running.

## 2016-02-10 NOTE — ED Notes (Signed)
Pt signed out to me awaiting [place]    Pt with llq abd pain today and two weeks of non bloody n/v/d with no dysuria, + preg    abd soft and nt    [place] results reviewed with pt    Pt unable to give ua without stool and wants to go home without it- aware uti can lead to inc miscarriage rates    F/u ob/gyn discussed    Labs Reviewed   CBC AND DIFFERENTIAL - Abnormal; Notable for the following:        Result Value    WBC 11.56 (*)     MCH 26.9 (*)     All other components within normal limits   COMPREHENSIVE METABOLIC PANEL - Abnormal; Notable for the following:     Protein, Total 5.8 (*)     Albumin 3.3 (*)     All other components within normal limits   HCG QUANTITATIVE - Abnormal; Notable for the following:     hCG, Quant. 266.8 (*)     All other components within normal limits   WET PREP TRICHOMONAS    Narrative:     ORDER#: [id number]                                    ORDERED BY: POTLURI, JAGADI  SOURCE: Vaginal Swab                                 COLLECTED:  02/09/16 21:47  ANTIBIOTICS AT COLL.:                                RECEIVED :  02/09/16 21:50  Wet Prep Trichomonas                       FINAL       02/09/16 21:53  02/09/16   No WBCs Seen             No Trichomonas Seen             No Yeast/Fungal Elements Seen             No Clue Cells Seen             Reference Range: No Trichomonas or Yeast Seen     CHLAMYDIA/GC BY PCR    Narrative:     Call Lab first   LIPASE   GFR   URINALYSIS       Radiology Results (24 Hour)     Procedure Component Value Units Date/Time    US OB < 14 Weeks with Wilhelmina Mcardle [[id number]] Collected:  02/09/16 2256    Order Status:  Completed Updated:  02/09/16 2308    Narrative:       History:  25 year old female with pain. Positive pregnancy test. LMP  01/18/2016.    FINDINGS:  The pelvis was scanned transabdominally and endovaginally. Duplex  Doppler vascular evaluation was performed of the arterial and venous  flow to the ovaries. Exam compared prior sonogram 03/07/2015.    FINDINGS:  The  uterus is normal in size measuring 9.1 x 5.1 x 6.4 cm. The double  layer endometrial thickness measures 2.0 cm. There is no detectable  intrauterine gestational sac at this time.    The right  ovary is enlarged and contains several cysts. Overall right  ovary size measures 7.1 x 6.8 x 5.1 cm. Largest individual right ovarian  cyst measures 5.2 x 4.6 x 4.2 cm. The left ovary is normal size at 3.5 x  2.1 x 3.0 cm, with an adjacent 3.0 x 1.4 x 2.5 cm paraovarian cyst.  Normal arterial and venous flow is seen to each ovary. There is no free  fluid in the cul-de-sac.      Impression:         1. No sonographically detectable intrauterine pregnancy at this time,  consistent with early gestational age. Follow-up needed to assure  viability.  2. Enlarged right ovary with several cysts, similar to previous  sonogram.  3. No detectable ovarian torsion.  4. Remainder as above.    Wilmon Pali, MD   02/09/2016 11:04 PM             Michaelle Copas, MD  02/10/16 1610

## 2016-02-10 NOTE — ED Notes (Addendum)
Ultrasound completed. Asked pt to attempt to provide urine sample. Pt stated that ultrasound tech made her go to the bathroom prior to the exam and she did not need to go at this time. Asked pt why she did not provide a sample when she had been asked multiple times before, pt stated she "did not think about it." Re-educated pt that MD still needs a sample. Pt verbalized understanding. IV fluids running - 2nd bag started.

## 2016-02-29 ENCOUNTER — Emergency Department: Payer: Medicaid Other

## 2016-02-29 ENCOUNTER — Emergency Department
Admission: EM | Admit: 2016-02-29 | Discharge: 2016-02-29 | Disposition: A | Discharge status: Home / Self Care | Payer: Medicaid Other | Attending: Emergency Medicine | Admitting: Emergency Medicine

## 2016-02-29 DIAGNOSIS — Z3A01 Less than 8 weeks gestation of pregnancy: Secondary | ICD-10-CM | POA: Insufficient documentation

## 2016-02-29 DIAGNOSIS — O9989 Other specified diseases and conditions complicating pregnancy, childbirth and the puerperium: Secondary | ICD-10-CM | POA: Insufficient documentation

## 2016-02-29 DIAGNOSIS — N764 Abscess of vulva: Secondary | ICD-10-CM | POA: Insufficient documentation

## 2016-02-29 DIAGNOSIS — N83209 Unspecified ovarian cyst, unspecified side: Secondary | ICD-10-CM

## 2016-02-29 DIAGNOSIS — L0291 Cutaneous abscess, unspecified: Secondary | ICD-10-CM

## 2016-02-29 DIAGNOSIS — O99331 Smoking (tobacco) complicating pregnancy, first trimester: Secondary | ICD-10-CM | POA: Insufficient documentation

## 2016-02-29 DIAGNOSIS — Z3490 Encounter for supervision of normal pregnancy, unspecified, unspecified trimester: Secondary | ICD-10-CM

## 2016-02-29 LAB — URINALYSIS, REFLEX TO MICROSCOPIC EXAM IF INDICATED
Bilirubin, UA: NEGATIVE
Blood, UA: NEGATIVE
Glucose, UA: NEGATIVE
Ketones UA: NEGATIVE
Leukocyte Esterase, UA: NEGATIVE
Nitrite, UA: NEGATIVE
Protein, UR: NEGATIVE
Specific Gravity UA: 1.014 (ref 1.001–1.035)
Urine pH: 5 (ref 5.0–8.0)
Urobilinogen, UA: NORMAL mg/dL

## 2016-02-29 LAB — HCG QUANTITATIVE: hCG, Quant.: 50637.79

## 2016-02-29 MED ORDER — ACETAMINOPHEN 500 MG PO TABS
1000.0000 mg | ORAL_TABLET | Freq: Once | ORAL | Status: AC
Start: 2016-02-29 — End: 2016-02-29
  Administered 2016-02-29: 1000 mg via ORAL
  Filled 2016-02-29: qty 2

## 2016-02-29 MED ORDER — LIDOCAINE HCL 2 % IJ SOLN
1.0000 mL | Freq: Once | INTRAMUSCULAR | Status: AC
Start: 2016-02-29 — End: 2016-02-29
  Administered 2016-02-29: 1 mL via INTRADERMAL
  Filled 2016-02-29: qty 20

## 2016-02-29 NOTE — ED Triage Notes (Signed)
Pt to ED c/o bump in her buttocks/groin area since today. Sent a photo to her OB who thought it might be herpes. Pt c/o pain in the area, but denies any discharge or fever. Pt states she is approximately [redacted] weeks pregnant. Has not had an ultrasound yet.     Pt denies having any firearms/weapons on her or in her belongings.

## 2016-02-29 NOTE — ED Provider Notes (Signed)
Physician/Midlevel provider first contact with patient: 02/29/16 1858         History     Chief Complaint   Patient presents with   . Groin Pain       Chief Complaint: "Bump"  Onset/Duration: Today  Quality/Location: Vaginal area  Severity: Moderate  Aggravating Factors: None  Alleviating Factors: None  Associated Symptoms/ Additional Comments: Pregnant    Today noted a painful bump on vaginal area.  Currently approximately [redacted] weeks pregnant, LMP 01/17/16.  Sent picture of bump to her obstetrician, who suggests come get checked out for herpes and also get an ultrasound of her pregnancy.  Patient denies abdominal pain/pelvic pain/vaginal discharge or vaginal bleeding.  OB - Vicenta Aly        The history is provided by the patient.            Past Medical History:   Diagnosis Date   . Anxiety    . Bacterial vaginitis    . Childhood asthma    . Depression    . HPV in female 2013    had cervical biopsies   . Migraine    . Other and unspecified ovarian cysts     left side   . Pollen allergies    . Postpartum depression     no meds       Past Surgical History:   Procedure Laterality Date   . tubes in ears         Family History   Problem Relation Age of Onset   . Drug abuse Father    . Drug abuse Maternal Aunt    . Alcohol abuse Maternal Uncle    . Depression Maternal Uncle    . Drug abuse Maternal Grandfather        Social  Social History   Substance Use Topics   . Smoking status: Current Every Day Smoker     Packs/day: 0.50     Years: 15.00     Types: Cigarettes   . Smokeless tobacco: Never Used      Comment: trying to quit, has been smoking since age 39 up to 1 ppd   . Alcohol use No       .     Allergies   Allergen Reactions   . Amoxicillin Anaphylaxis   . Ceclor [Cefaclor] Anaphylaxis and Rash   . Macrodantin Anaphylaxis   . Penicillins Anaphylaxis   . Chocolate Nausea And Vomiting   . Flagyl [Metronidazole] Itching   . Percocet [Oxycodone-Acetaminophen] Nausea And Vomiting   . Sulfa Antibiotics Itching   .  Vicodin [Hydrocodone-Acetaminophen] Hives     Passed out     . Clindamycin Rash       Home Medications     Med List Status:  In Progress Set By: Cornelia Copa, RN at 02/29/2016  6:43 PM        No Medications           Review of Systems   Constitutional: Negative for fatigue and fever.   HENT: Negative for congestion and sore throat.    Eyes: Negative for redness and visual disturbance.   Respiratory: Negative for cough and shortness of breath.    Cardiovascular: Negative for chest pain and palpitations.   Gastrointestinal: Negative for abdominal pain, diarrhea, nausea and vomiting.   Genitourinary: Positive for vaginal pain. Negative for difficulty urinating, dysuria, vaginal bleeding and vaginal discharge.   Musculoskeletal: Negative for back pain.   Skin: Negative for  rash.   Neurological: Negative for dizziness, weakness and headaches.   Psychiatric/Behavioral: Negative for suicidal ideas. The patient is not nervous/anxious.        Physical Exam    BP: 153/72, Heart Rate: 83, Temp: 97.2 F (36.2 C), Resp Rate: 16, SpO2: 99 %, Weight: 90.7 kg    Physical Exam   Constitutional: She appears well-developed. No distress.   HENT:   Head: Normocephalic and atraumatic.   Eyes: Conjunctivae are normal. Pupils are equal, round, and reactive to light.   Neck: Normal range of motion.   Cardiovascular: Normal rate and regular rhythm.    Pulmonary/Chest: Effort normal and breath sounds normal. No respiratory distress.   Abdominal: Soft. She exhibits no mass. There is no tenderness.   Genitourinary:         Musculoskeletal: Normal range of motion. She exhibits no edema.        Right shoulder: She exhibits no deformity.   Lymphadenopathy:     She has no cervical adenopathy.   Neurological: She is alert. She has normal strength.   Skin: Skin is warm. No rash noted.   Psychiatric: She has a normal mood and affect. Her speech is normal.         MDM and ED Course     ED Medication Orders     None             MDM  Number of  Diagnoses or Management Options  Abscess: new and requires workup  Cyst of ovary, unspecified laterality: minor  Pregnancy, unspecified gestational age: new and requires workup  Diagnosis management comments: Not bleeding vaginally and RhoGAM not indicated, secondary O+ blood blood type prior med records.       Amount and/or Complexity of Data Reviewed  Clinical lab tests: ordered and reviewed  Tests in the radiology section of CPT: ordered and reviewed    Risk of Complications, Morbidity, and/or Mortality  Presenting problems: high  Diagnostic procedures: high  Management options: high    Patient Progress  Patient progress: stable        ED Course              Incision/Drainage  Date/Time: 02/29/2016 10:53 PM  Performed by: Matilde Haymaker JEFFREY  Authorized by: Matilde Haymaker JEFFREY     Consent:     Consent obtained:  Verbal    Consent given by:  Patient  Location:     Type:  Abscess    Location:  Anogenital  Pre-procedure details:     Skin preparation:  Betadine  Anesthesia (see MAR for exact dosages):     Anesthesia method:  Local infiltration    Local anesthetic:  Lidocaine 2% w/o epi  Procedure type:     Complexity:  Simple  Procedure details:     Incision types:  Stab incision    Incision depth:  Dermal    Scalpel blade:  11    Wound management:  Probed and deloculated    Drainage:  Purulent    Drainage amount:  Moderate    Wound treatment:  Wound left open    Packing materials:  None  Post-procedure details:     Patient tolerance of procedure:  Tolerated well, no immediate complications        Clinical Impression & Disposition     Clinical Impression  Final diagnoses:   None        ED Disposition     None  New Prescriptions    No medications on file                 Herma Ard, MD  02/29/16 2253

## 2016-02-29 NOTE — Discharge Instructions (Signed)
Daily Sitz bath soaks.      Abscess    You were diagnosed with an abscess of the:   Skin.    An abscess is a sore that is infected and filled with pus. It is caused by an infection with bacteria. Sometimes a splinter or other object stuck in the skin can cause an infection that can become an abscess. The body traps the infection in a tight pocket to try to stop the infection from spreading to other areas. The usual treatment is to make a cut in the abscess so the pus can drain out. Most abscesses heal quickly with no need for antibiotics.    Your doctor has decided that antibiotics ARE NOT necessary to treat your abscess.    A drain and/or packing have been placed in your abscess to help the wound heal. The packing in your wound will need to be changed. This can be done by your family doctor, a referral doctor, this facility or the nearest Emergency Department. Your doctor will set up a plan for you to have the packing changed. Leave the drain and packing in place until you see a doctor. The drain might fall out on its own. If this happens, cover the abscess with a clean dressing (bandage) and follow up with a doctor as scheduled. Until the packing is removed, don't soak the wound in water, like in a bathtub or pool. Short showers or sponge baths are okay. Keep the wound as dry and clean as possible.    YOU SHOULD SEEK MEDICAL ATTENTION IMMEDIATELY, EITHER HERE OR AT THE NEAREST EMERGENCY DEPARTMENT, IF ANY OF THE FOLLOWING OCCURS:   You see unusual redness or swelling.   You see red streaks on the arm or leg.   The wound or drainage smells bad.   You have fever (temperature higher than 100.12F / 38C), chills, worse pain or swelling.           Ovarian Cyst    You have been diagnosed with an ovarian cyst.    Ovarian cysts are common in women with abdominal (belly) pain, pelvic pain or cramping.    Ovarian cysts are like small bubbles filled with fluid. These cysts are also called "ovarian follicles."  They form in the ovary during a normal menstrual cycle (period). During your cycle, an egg ripens inside the cyst. It is getting ready for ovulation. Sometimes a follicle gets large or many follicles form. This can cause pelvic pain or cramps.    If the cyst gets too big, it may break open. When this happens, it releases blood and fluid into the abdomen (belly). This causes sudden severe pain. The pain often gets better within a few hours with no treatment. Some people need pain medicine.    A few women have a condition called polycystic ovarian disease. In these women, several cysts form on both ovaries during the menstrual cycle. This causes irregular (unpredictable) menstrual periods. This condition is caused by hormones.    To diagnose polycystic ovarian disease, a doctor usually examines a woman's pelvis and asks questions about her symptoms. If the doctor cannot feel the cysts with his or her hands, an ultrasound may be needed.    Emergency ultrasound testing for cysts is rarely needed. Instead, the test is usually during regular weekday office hours in the radiology department. The medical staff will schedule the test in the next few days. You may also see your regular doctor instead to schedule the ultrasound.  The doctor has decided it is OK for you to go home.    If symptoms signal complications like infection or bleeding, you may need to come here or to the nearest Emergency Department.    Follow up with your gynecologist or regular doctor in the next few days. Tell your doctor about this visit.   If you do not have a doctor or clinic to follow up with, tell the medical staff before leaving today. We can help make the arrangements for you.    YOU SHOULD SEEK MEDICAL ATTENTION IMMEDIATELY, EITHER HERE OR AT THE NEAREST EMERGENCY DEPARTMENT, IF ANY OF THE FOLLOWING OCCURS:   You have worse pain in the abdomen (belly), pelvis or back.   More and more vaginal discharge or bleeding, or passing  large blood clots.   Fever (temperature higher than 100.69F / 38C), chills, nausea, vomiting (throwing up).   Feeling dizzy, lightheaded or passing out.             Pregnancy    It has been determined you are pregnant.    You originally came here for another problem. However, pregnancy will affect how we evaluate and treat your medical condition.    You have been referred to an obstetrician [facility] doctor). Call to make your appointment today. Close follow-up with an OB doctor is very important for you and your baby.    Get follow-up care as soon as possible, certainly within the next few days. Inform your doctor or obstetrician of your visit here and your pregnancy.    If you smoke, stop. This is necessary to protect yourself and the health of your fetus (developing baby). Smoking makes miscarriage (losing your pregnancy) more likely.    Abusing alcohol or drugs will hurt the fetus (developing baby). It may cause a miscarriage, abnormal development or premature birth. It can also cause severe health or mental problems after birth. Avoid these substances from now until the end of your pregnancy.    If taking pain medicines, antipsychotics, antidepressants or seizure medicine, check with your doctor as soon as possible. Ask if you should continue with these medicines.    Return here or go to the nearest Emergency Department right away if you have: Pain or cramping in the abdomen (belly) or pelvis, vaginal bleeding, nausea/vomiting, dizziness/lightheadedness or passing out.    Keep yourself well-hydrated at all times. Drink a lot of fluids and eat a balanced, healthy, nutritious diet.    Take prenatal vitamins every day. These may be prescribed for you on this visit. You may need to get them from the follow-up care provider. You can also get less expensive kinds over the counter (without a prescription). Ask you pharmacist for details.

## 2016-03-16 ENCOUNTER — Other Ambulatory Visit: Payer: Self-pay | Admitting: Specialist

## 2016-03-16 DIAGNOSIS — Z3682 Encounter for antenatal screening for nuchal translucency: Secondary | ICD-10-CM

## 2016-04-02 ENCOUNTER — Emergency Department: Payer: Medicaid Other

## 2016-04-02 ENCOUNTER — Emergency Department
Admission: EM | Admit: 2016-04-02 | Discharge: 2016-04-02 | Disposition: A | Discharge status: Home / Self Care | Payer: Medicaid Other | Attending: Emergency Medicine | Admitting: Emergency Medicine

## 2016-04-02 DIAGNOSIS — Z885 Allergy status to narcotic agent status: Secondary | ICD-10-CM | POA: Insufficient documentation

## 2016-04-02 DIAGNOSIS — Z3A12 12 weeks gestation of pregnancy: Secondary | ICD-10-CM | POA: Insufficient documentation

## 2016-04-02 DIAGNOSIS — O9989 Other specified diseases and conditions complicating pregnancy, childbirth and the puerperium: Secondary | ICD-10-CM | POA: Insufficient documentation

## 2016-04-02 DIAGNOSIS — Z880 Allergy status to penicillin: Secondary | ICD-10-CM | POA: Insufficient documentation

## 2016-04-02 DIAGNOSIS — R112 Nausea with vomiting, unspecified: Secondary | ICD-10-CM

## 2016-04-02 DIAGNOSIS — Z881 Allergy status to other antibiotic agents status: Secondary | ICD-10-CM | POA: Insufficient documentation

## 2016-04-02 DIAGNOSIS — K529 Noninfective gastroenteritis and colitis, unspecified: Secondary | ICD-10-CM

## 2016-04-02 DIAGNOSIS — Z87891 Personal history of nicotine dependence: Secondary | ICD-10-CM | POA: Insufficient documentation

## 2016-04-02 DIAGNOSIS — R824 Acetonuria: Secondary | ICD-10-CM | POA: Insufficient documentation

## 2016-04-02 LAB — COMPREHENSIVE METABOLIC PANEL
ALT: 15 U/L (ref 0–55)
AST (SGOT): 15 U/L (ref 5–34)
Albumin/Globulin Ratio: 1 (ref 0.9–2.2)
Albumin: 3.3 g/dL — ABNORMAL LOW (ref 3.5–5.0)
Alkaline Phosphatase: 65 U/L (ref 37–106)
Anion Gap: 8 (ref 5.0–15.0)
BUN: 5.2 mg/dL — ABNORMAL LOW (ref 7.0–19.0)
Bilirubin, Total: 0.4 mg/dL (ref 0.2–1.2)
CO2: 21 mEq/L — ABNORMAL LOW (ref 22–29)
Calcium: 8.7 mg/dL (ref 8.5–10.5)
Chloride: 108 mEq/L (ref 100–111)
Creatinine: 0.7 mg/dL (ref 0.6–1.0)
Globulin: 3.3 g/dL (ref 2.0–3.6)
Glucose: 84 mg/dL (ref 70–100)
Potassium: 3.9 mEq/L (ref 3.5–5.1)
Protein, Total: 6.6 g/dL (ref 6.0–8.3)
Sodium: 137 mEq/L (ref 136–145)

## 2016-04-02 LAB — CBC
Absolute NRBC: 0 10*3/uL
Hematocrit: 39.6 % (ref 37.0–47.0)
Hgb: 13.1 g/dL (ref 12.0–16.0)
MCH: 27.6 pg — ABNORMAL LOW (ref 28.0–32.0)
MCHC: 33.1 g/dL (ref 32.0–36.0)
MCV: 83.4 fL (ref 80.0–100.0)
MPV: 9.6 fL (ref 9.4–12.3)
Nucleated RBC: 0 /100 WBC (ref 0.0–1.0)
Platelets: 236 10*3/uL (ref 140–400)
RBC: 4.75 10*6/uL (ref 4.20–5.40)
RDW: 13 % (ref 12–15)
WBC: 9.24 10*3/uL (ref 3.50–10.80)

## 2016-04-02 LAB — GFR: EGFR: >60

## 2016-04-02 LAB — URINALYSIS, REFLEX TO MICROSCOPIC EXAM IF INDICATED
Bilirubin, UA: NEGATIVE
Blood, UA: NEGATIVE
Glucose, UA: NEGATIVE
Ketones UA: 80 — AB
Leukocyte Esterase, UA: NEGATIVE
Nitrite, UA: NEGATIVE
Protein, UR: NEGATIVE
Specific Gravity UA: 1.018 (ref 1.001–1.035)
Urine pH: 6 (ref 5.0–8.0)
Urobilinogen, UA: 4 mg/dL — AB

## 2016-04-02 LAB — LIPASE: Lipase: 27 U/L (ref 8–78)

## 2016-04-02 MED ORDER — SODIUM CHLORIDE 0.9 % IV BOLUS
1000.0000 mL | Freq: Once | INTRAVENOUS | Status: AC
Start: 2016-04-02 — End: 2016-04-02
  Administered 2016-04-02: 1000 mL via INTRAVENOUS

## 2016-04-02 MED ORDER — ACETAMINOPHEN 325 MG PO TABS
650.0000 mg | ORAL_TABLET | Freq: Once | ORAL | Status: AC
Start: 2016-04-02 — End: 2016-04-02
  Administered 2016-04-02: 650 mg via ORAL
  Filled 2016-04-02: qty 2

## 2016-04-02 MED ORDER — DEXTROSE-SODIUM CHLORIDE 5-0.45 % IV SOLN
INTRAVENOUS | Status: DC
Start: 2016-04-02 — End: 2016-04-02

## 2016-04-02 MED ORDER — ONDANSETRON HCL 4 MG/2ML IJ SOLN
4.0000 mg | Freq: Three times a day (TID) | INTRAMUSCULAR | Status: DC | PRN
Start: 2016-04-02 — End: 2016-04-02
  Administered 2016-04-02: 4 mg via INTRAVENOUS
  Filled 2016-04-02: qty 2

## 2016-04-02 MED ORDER — ONDANSETRON 4 MG PO TBDP
4.0000 mg | ORAL_TABLET | Freq: Four times a day (QID) | ORAL | 0 refills | Status: DC | PRN
Start: 2016-04-02 — End: 2016-05-26

## 2016-04-02 NOTE — ED Provider Notes (Signed)
Physician/Midlevel provider first contact with patient: 04/02/16 1416         History     Chief Complaint   Patient presents with   . Emesis   . Headache   . Loss of Consciousness     Patient presents emergency department complaining of nausea related to pregnancy over the last couple of weeks with 3 days of copious nausea, vomiting and diarrhea in the setting of family illness/gastroenteritis.  States that her son and father have all had "stomach bug" over the last several days.  She has had significant uptake and nausea with vomiting and watery diarrhea over last 3 days.  Has had diminished by mouth intake.  Yesterday, she felt that she "passed out".  Denies striking her head or LOC completely.  Denies preceding chest pain or shortness of breath.  Denies vaginal bleeding or abdominal pain.  Patient is [redacted] weeks pregnant, saw her OB 2 days ago, gave her "some medication" for nausea without improvement.  Denies urinary symptoms, endorses subjective fevers and chills.    No recent antibiotics, no recent international travel.        The history is provided by the patient. No language interpreter was used.   Emesis   Severity:  Moderate  Duration:  3 days  Timing:  Constant  Quality:  Bilious material  Able to tolerate:  Liquids  Progression:  Unchanged  Chronicity:  New  Relieved by:  Nothing  Worsened by:  Nothing  Ineffective treatments:  None tried  Associated symptoms: diarrhea and headaches    Associated symptoms: no abdominal pain, no arthralgias, no chills, no fever, no myalgias and no sore throat    Diarrhea:     Quality:  Watery    Severity:  Moderate    Timing:  Intermittent  Headaches:     Severity:  Mild    Onset quality:  Gradual    Timing:  Constant    Progression:  Unchanged    Chronicity:  New  Risk factors: pregnant and sick contacts    Risk factors: no alcohol use, no diabetes, no prior abdominal surgery and no suspect food intake             Past Medical History:   Diagnosis Date   . Anxiety    .  Bacterial vaginitis    . Childhood asthma    . Depression    . HPV in female 2013    had cervical biopsies   . Migraine    . Other and unspecified ovarian cysts     left side   . Pollen allergies    . Postpartum depression     no meds       Past Surgical History:   Procedure Laterality Date   . tubes in ears         Family History   Problem Relation Age of Onset   . Drug abuse Father    . Drug abuse Maternal Aunt    . Alcohol abuse Maternal Uncle    . Depression Maternal Uncle    . Drug abuse Maternal Grandfather        Social  Social History   Substance Use Topics   . Smoking status: Former Smoker     Packs/day: 0.50     Years: 15.00     Types: Cigarettes   . Smokeless tobacco: Never Used      Comment: trying to quit, has been smoking since age 67 up to 1  ppd   . Alcohol use No       .     Allergies   Allergen Reactions   . Amoxicillin Anaphylaxis   . Ceclor [Cefaclor] Anaphylaxis and Rash   . Macrodantin Anaphylaxis   . Penicillins Anaphylaxis   . Chocolate Nausea And Vomiting   . Flagyl [Metronidazole] Itching   . Percocet [Oxycodone-Acetaminophen] Nausea And Vomiting   . Sulfa Antibiotics Itching   . Vicodin [Hydrocodone-Acetaminophen] Hives     Passed out     . Clindamycin Rash       Home Medications     Med List Status:  In Progress Set By: Adelfa Koh, RN at 04/02/2016  1:36 PM        No Medications           Review of Systems   Constitutional: Positive for activity change. Negative for appetite change, chills, diaphoresis, fatigue and fever.   HENT: Negative.  Negative for sore throat.    Respiratory: Negative.    Cardiovascular: Negative.    Gastrointestinal: Positive for diarrhea, nausea and vomiting. Negative for abdominal distention, abdominal pain, anal bleeding, blood in stool, constipation and rectal pain.   Genitourinary: Negative.    Musculoskeletal: Negative.  Negative for arthralgias and myalgias.   Skin: Negative.    Neurological: Positive for headaches. Negative for dizziness, tremors, seizures,  syncope, facial asymmetry, speech difficulty, weakness, light-headedness and numbness.   Psychiatric/Behavioral: Negative.    All other systems reviewed and are negative.      Physical Exam    BP: 115/60, Heart Rate: 76, Temp: 97 F (36.1 C), Resp Rate: 18, SpO2: 100 %, Weight: 65.8 kg    Physical Exam   Constitutional: She is oriented to person, place, and time. She appears well-developed and well-nourished. She is active.  Non-toxic appearance. She does not have a sickly appearance. She does not appear ill. No distress.   HENT:   Head: Normocephalic.   Right Ear: External ear normal.   Left Ear: External ear normal.   Mouth/Throat: Oropharynx is clear and moist.   Eyes: Pupils are equal, round, and reactive to light.   Neck: Normal range of motion. Neck supple.   Cardiovascular: Normal rate, regular rhythm, normal heart sounds and intact distal pulses.    No murmur heard.  Pulmonary/Chest: Effort normal and breath sounds normal. No respiratory distress. She has no decreased breath sounds. She has no wheezes. She has no rhonchi. She has no rales. She exhibits no tenderness.   Abdominal: Soft. Bowel sounds are normal. She exhibits no shifting dullness, no distension, no pulsatile liver, no fluid wave, no abdominal bruit, no ascites, no pulsatile midline mass and no mass. There is no tenderness.   Musculoskeletal: Normal range of motion.        Cervical back: Normal.        Thoracic back: Normal.        Lumbar back: Normal.   Neurological: She is alert and oriented to person, place, and time. She displays normal reflexes. No cranial nerve deficit or sensory deficit. She exhibits normal muscle tone. Coordination normal.   Skin: Skin is warm. She is not diaphoretic.   Vitals reviewed.        MDM and ED Course     ED Medication Orders     Start Ordered     Status Ordering Provider    04/02/16 1640 04/02/16 1639    Continuous     Route: Intravenous  Discontinued Kanden Carey EDWIN    04/02/16 1619 04/02/16 1618  sodium  chloride 0.9 % bolus 1,000 mL  Once     Route: Intravenous  Ordered Dose: 1,000 mL     Last MAR action:  Everlean Patterson    04/02/16 1529 04/02/16 1528  acetaminophen (TYLENOL) tablet 650 mg  Once     Route: Oral  Ordered Dose: 650 mg     Last MAR action:  Given Zelpha Messing EDWIN    04/02/16 1415 04/02/16 1414  sodium chloride 0.9 % bolus 1,000 mL  Once     Route: Intravenous  Ordered Dose: 1,000 mL     Last MAR action:  Stopped Allice Garro EDWIN    04/02/16 1414 04/02/16 1414    Every 8 hours PRN     Route: Intravenous  Ordered Dose: 4 mg     Discontinued Armstrong Creasy EDWIN             MDM  Number of Diagnoses or Management Options  Gastroenteritis: new and requires workup  Intractable vomiting with nausea, unspecified vomiting type: new and requires workup  Ketonuria: new and requires workup  Diagnosis management comments:   I, Adelene Idler, Physician Assistant, have been the primary provider for this pt during their ED stay.     The attending signature signifies review and agreement of the history, physical examination, evaluation, clinical impression and plan except as noted.     02 sat is 97% on  ra, which is nml.     Pt with vomiting and diarrhea in the setting of multiple sick contacts.    Benign abd episode.  Doubt syncopal episode, no head strike.    Will give IV fluids, check labs.    Lab/Rad results reviewed include:     Labs Reviewed  CBC - Abnormal; Notable for the following:      MCH                           27.6 (*)            All other components within normal limits  COMPREHENSIVE METABOLIC PANEL - Abnormal; Notable for the following:      BUN                           5.2 (*)                CO2                           21 (*)                 Albumin                       3.3 (*)             All other components within normal limits  URINALYSIS, REFLEX TO MICROSCOPIC EXAM IF INDICATED - Abnormal; Notable for the following:      Color, UA                     Amber (*)               Clarity, UA                    Sl Cloudy (*)  Ketones UA                    80 (*)                 Urobilinogen, UA              4.0 (*)             All other components within normal limits  LIPASE  GFR    EKG is NSR at 73 bpm  NO ST changes  No STEMI  Unchanged from previous.      PT feeling better, taking PO.  Repeat abd exam (-).  Pt ambulatory.    WIll d/c home with supportive care, f/u with PCP/OB in 1-2 days.  Understands need for increased oral hydration.    Pt given typical ED return precautions with worsening or changing symptoms, and verbalizes understanding of need for follow-up.     *This note was generated by the Epic EMR system/ Dragon speech recognition and may contain inherent errors or omissions not intended by the user. Grammatical errors, random word insertions, deletions, pronoun errors and incomplete sentences are occasional consequences of this technology due to software limitations. Not all errors are caught or corrected. If there are questions or concerns about the content of this note or information contained within the body of this dictation they should be addressed directly with the author for clarification         Amount and/or Complexity of Data Reviewed  Clinical lab tests: ordered and reviewed  Tests in the medicine section of CPT: reviewed and ordered  Review and summarize past medical records: yes  Discuss the patient with other providers: yes    Risk of Complications, Morbidity, and/or Mortality  Presenting problems: high  Diagnostic procedures: high  Management options: high    Patient Progress  Patient progress: improved        ED Course              Procedures    Clinical Impression & Disposition     Clinical Impression  Final diagnoses:   Gastroenteritis   Ketonuria   Intractable vomiting with nausea, unspecified vomiting type        ED Disposition     ED Disposition Condition Date/Time Comment    Discharge Boarder to Home  Sat Apr 02, 2016  6:32 PM            Discharge Medication  List as of 04/02/2016  6:32 PM      START taking these medications    Details   ondansetron (ZOFRAN-ODT) 4 MG disintegrating tablet Take 1 tablet (4 mg total) by mouth every 6 (six) hours as needed for Nausea., Starting Sat 04/02/2016, Print                       Jamse Arn, [place]  04/06/16 2328

## 2016-04-02 NOTE — ED Triage Notes (Signed)
Pt states "i need fluids", states she hasn't been able to eat or drink for weeks, states [redacted] weeks pregnant, was given meds for nausea with no relief.  Pt alert, respirations even and unlabored, skin pink/warm/dry, dry mucous membranes, pt states she passed out yesterday while walking and was unconscious for "a few seconds", pt reports headpain as well.  Pt states no weapons on person.

## 2016-04-02 NOTE — ED Provider Notes (Signed)
Pt was seen and evaluated by mid level provider. Case discussed with me. I agree with assessment and plan.      Patient [redacted] weeks pregnant, presenting with vomiting and diarrhea.  Patient states she has had morning sickness.  However, she got sick with something viral, but her family has, as well.    Patient states she passed out for a few seconds yesterday because of dehydration.    Moist mucous membrane, abdomen soft, nontender, nondistended     Dorothey Baseman, MD  04/02/16 [phone]

## 2016-04-02 NOTE — Discharge Instructions (Signed)
Vomiting / Diarrhea (Gastro), Viral    You have been diagnosed with vomiting and diarrhea, also called gastroenteritis or "stomach flu." In your case, the cause is thought to be a virus.    Although the term "stomach flu" is often used, gastroenteritis IS NOT THE FLU. The real flu is a serious respiratory (lung) infection caused by a virus called influenza.    Gastroenteritis almost always causes diarrhea and sometimes, bloody diarrhea. It might also cause abdominal cramping, vomiting, or fever (temperature higher than 100.4F / 38C). If you vomit, but do not have diarrhea, you may not have gastroenteritis. The usual treatment is medication for nausea and diarrhea. It might be helpful to avoid eating for the next day. Drink clear liquids, including water, broth, or diluted (watered down) juice. Avoid large heavy meals that make you sick to your stomach. Add more foods as you feel better. Symptoms usually improve over a few days but can last up to a week.    It is important to get enough fluids when you have a stomach virus.    Follow up with your primary doctor if you do not improve or if you feel worse.    YOU SHOULD SEEK MEDICAL ATTENTION IMMEDIATELY, EITHER HERE OR AT THE NEAREST EMERGENCY DEPARTMENT, IF ANY OF THE FOLLOWING OCCURS:   You do not urinate at least once every 8 hours.   Your vomiting becomes more frequent or you cannot keep fluids down.   Your vomit is bloody or dark green or your stool is bloody.   You do not improve over 2 to 3 days.   You develop any new symptoms or concerns.

## 2016-04-03 LAB — ECG 12-LEAD
Atrial Rate: 73 {beats}/min
P Axis: 37 degrees
P-R Interval: 168 ms
Q-T Interval: 420 ms
QRS Duration: 96 ms
QTC Calculation (Bezet): 462 ms
R Axis: 1 degrees
T Axis: 14 degrees
Ventricular Rate: 73 {beats}/min

## 2016-04-04 NOTE — L&D Delivery Note (Signed)
Delivery Summary for Murriel Holwerda,  Patient's last menstrual period was 01/18/2016., Estimated Date of Delivery: 10/17/16    Pt admitted at: 10/04/2016  6:20 AM    Patient Active Problem List   Diagnosis   . Abdominal pain complicating pregnancy   . Umbilical hernia   . Pregnancy   . [redacted] weeks gestation of pregnancy   . SOB (shortness of breath)   . Post term pregnancy   . Abdominal pain   . Pain   . [redacted] weeks gestation of pregnancy       Time of Delivery: 1601  10/04/2016     Attending/Supervising MD:  Rolene Arbour    Delivering Provider: Rolene Arbour    Procedure: Spontaneous vaginal delivery    Anesthesia: Epidural     A female  infant was delivered from  LOA presentation.    Infant Wt:         6lbs 14 oz  Infant Name:  ?      Apgars:  8  at and 9 at 5 minutes    ROM Time: 1345 10/04/16  Amniotic Fluid: Clear       Placenta and Cord:          Mechanism:       Description:      Episiotomy: None     Lacerations: None    Laceration Type:   none   Episiotomy/Laceration Repair:  none     Estimated Blood Loss:  200cc's        Specimens: None  Disposition: discarded           Complications:  none  Sponge and instrument counts correct.

## 2016-04-13 ENCOUNTER — Ambulatory Visit
Admission: RE | Admit: 2016-04-13 | Discharge: 2016-04-13 | Disposition: A | Discharge status: Home / Self Care | Payer: Medicaid Other | Source: Ambulatory Visit | Attending: Specialist | Admitting: Specialist

## 2016-04-13 ENCOUNTER — Other Ambulatory Visit: Payer: Self-pay | Admitting: Specialist

## 2016-04-13 DIAGNOSIS — Z3682 Encounter for antenatal screening for nuchal translucency: Secondary | ICD-10-CM

## 2016-04-13 DIAGNOSIS — Z3689 Encounter for other specified antenatal screening: Secondary | ICD-10-CM | POA: Insufficient documentation

## 2016-04-13 DIAGNOSIS — Z3A13 13 weeks gestation of pregnancy: Secondary | ICD-10-CM | POA: Insufficient documentation

## 2016-04-18 ENCOUNTER — Telehealth: Payer: Self-pay | Admitting: Obstetrics & Gynecology

## 2016-04-18 NOTE — Telephone Encounter (Signed)
Pt notified of normal NT results. Report reviewed by Dr Gomez and faxed to provider.

## 2016-05-08 ENCOUNTER — Emergency Department: Payer: Medicaid Other

## 2016-05-08 ENCOUNTER — Emergency Department
Admission: EM | Admit: 2016-05-08 | Discharge: 2016-05-08 | Disposition: A | Discharge status: Home / Self Care | Payer: Medicaid Other | Attending: Emergency Medicine | Admitting: Emergency Medicine

## 2016-05-08 DIAGNOSIS — Z3A17 17 weeks gestation of pregnancy: Secondary | ICD-10-CM | POA: Insufficient documentation

## 2016-05-08 DIAGNOSIS — O368120 Decreased fetal movements, second trimester, not applicable or unspecified: Secondary | ICD-10-CM

## 2016-05-08 DIAGNOSIS — Z87891 Personal history of nicotine dependence: Secondary | ICD-10-CM | POA: Insufficient documentation

## 2016-05-08 DIAGNOSIS — O26892 Other specified pregnancy related conditions, second trimester: Secondary | ICD-10-CM | POA: Insufficient documentation

## 2016-05-08 DIAGNOSIS — R109 Unspecified abdominal pain: Secondary | ICD-10-CM | POA: Insufficient documentation

## 2016-05-08 LAB — URINALYSIS
Bilirubin, UA: NEGATIVE
Blood, UA: NEGATIVE
Glucose, UA: NEGATIVE
Ketones UA: NEGATIVE
Leukocyte Esterase, UA: NEGATIVE
Nitrite, UA: NEGATIVE
Protein, UR: 30 — AB
Specific Gravity UA: 1.019 (ref 1.001–1.035)
Urine pH: 6 (ref 5.0–8.0)
Urobilinogen, UA: NORMAL mg/dL (ref 0.2–2.0)

## 2016-05-08 LAB — CBC AND DIFFERENTIAL
Absolute NRBC: 0 10*3/uL
Basophils Absolute Automated: 0.02 10*3/uL (ref 0.00–0.20)
Basophils Automated: 0.2 %
Eosinophils Absolute Automated: 0.11 10*3/uL (ref 0.00–0.70)
Eosinophils Automated: 1.3 %
Hematocrit: 38.5 % (ref 37.0–47.0)
Hgb: 12.8 g/dL (ref 12.0–16.0)
Immature Granulocytes Absolute: 0.03 10*3/uL
Immature Granulocytes: 0.3 %
Lymphocytes Absolute Automated: 2.41 10*3/uL (ref 0.50–4.40)
Lymphocytes Automated: 27.8 %
MCH: 26.9 pg — ABNORMAL LOW (ref 28.0–32.0)
MCHC: 33.2 g/dL (ref 32.0–36.0)
MCV: 80.9 fL (ref 80.0–100.0)
MPV: 9.8 fL (ref 9.4–12.3)
Monocytes Absolute Automated: 0.45 10*3/uL (ref 0.00–1.20)
Monocytes: 5.2 %
Neutrophils Absolute: 5.64 10*3/uL (ref 1.80–8.10)
Neutrophils: 65.2 %
Nucleated RBC: 0 /100 WBC (ref 0.0–1.0)
Platelets: 239 10*3/uL (ref 140–400)
RBC: 4.76 10*6/uL (ref 4.20–5.40)
RDW: 13 % (ref 12–15)
WBC: 8.66 10*3/uL (ref 3.50–10.80)

## 2016-05-08 LAB — COMPREHENSIVE METABOLIC PANEL
ALT: 13 U/L (ref 0–55)
AST (SGOT): 12 U/L (ref 5–34)
Albumin/Globulin Ratio: 0.9 (ref 0.9–2.2)
Albumin: 2.8 g/dL — ABNORMAL LOW (ref 3.5–5.0)
Alkaline Phosphatase: 62 U/L (ref 37–106)
Anion Gap: 11 (ref 5.0–15.0)
BUN: 6 mg/dL — ABNORMAL LOW (ref 7.0–19.0)
Bilirubin, Total: 0.3 mg/dL (ref 0.2–1.2)
CO2: 19 mEq/L — ABNORMAL LOW (ref 22–29)
Calcium: 8.8 mg/dL (ref 8.5–10.5)
Chloride: 108 mEq/L (ref 100–111)
Creatinine: 0.6 mg/dL (ref 0.6–1.0)
Globulin: 3.2 g/dL (ref 2.0–3.6)
Glucose: 97 mg/dL (ref 70–100)
Potassium: 4.1 mEq/L (ref 3.5–5.1)
Protein, Total: 6 g/dL (ref 6.0–8.3)
Sodium: 138 mEq/L (ref 136–145)

## 2016-05-08 LAB — URINE MICROSCOPIC

## 2016-05-08 LAB — GFR: EGFR: >60

## 2016-05-08 NOTE — Discharge Instructions (Signed)
YOU MAY SEE THE DOCTOR LISTED IF YOUR DOCTOR IS NOT AVAILABLE FOR FOLLOW UP IN THE TIME FRAME PROVIDED.  RETURN TO THE EMERGENCY DEPARTMENT IMMEDIATELY FOR ANY WORSENING OR CONCERNS.        Abdominal Pain    You have been diagnosed with abdominal (belly) pain. The cause of your pain is not yet known.    Many things can cause abdominal pain. Examples include viral infections and bowel (intestine) spasms. You might need another examination or more tests to find out why you have pain.    At this time, your pain does not seem to be caused by anything dangerous. You do not need surgery. You do not need to stay in the hospital.     Though we don't believe your condition is dangerous right now, it is important to be careful. Sometimes a problem that seems mild can become serious later. This is why it is very important that you return here or go to the nearest Emergency Department unless you are 100% improved.    Return here or go to the nearest Emergency Department, or follow up with your physician in:   2-3 days.    Drink only clear liquids such as water, clear broth, sports drinks, or clear caffeine-free soft drinks, like 7-Up or Sprite, for the next:   24 hours.    YOU SHOULD SEEK MEDICAL ATTENTION IMMEDIATELY, EITHER HERE OR AT THE NEAREST EMERGENCY DEPARTMENT, IF ANY OF THE FOLLOWING OCCURS:   Your pain does not go away or gets worse.   You cannot keep fluids down or your vomit is dark green.    You vomit blood or see blood in your stool. Blood might be bright red or dark red. It can also be black and look like tar.   You have a fever (temperature higher than 100.67F / 38C) or shaking chills.   Your skin or eyes look yellow or your urine looks brown.   You have severe diarrhea.        Pregnancy    It has been determined you are pregnant.    You originally came here for another problem. However, pregnancy will affect how we evaluate and treat your medical condition.    You have been referred to an  obstetrician [facility] doctor). Call to make your appointment today. Close follow-up with an OB doctor is very important for you and your baby.    Get follow-up care as soon as possible, certainly within the next few days. Inform your doctor or obstetrician of your visit here and your pregnancy.    If you smoke, stop. This is necessary to protect yourself and the health of your fetus (developing baby). Smoking makes miscarriage (losing your pregnancy) more likely.    Abusing alcohol or drugs will hurt the fetus (developing baby). It may cause a miscarriage, abnormal development or premature birth. It can also cause severe health or mental problems after birth. Avoid these substances from now until the end of your pregnancy.    If taking pain medicines, antipsychotics, antidepressants or seizure medicine, check with your doctor as soon as possible. Ask if you should continue with these medicines.    Return here or go to the nearest Emergency Department right away if you have: Pain or cramping in the abdomen (belly) or pelvis, vaginal bleeding, nausea/vomiting, dizziness/lightheadedness or passing out.    Keep yourself well-hydrated at all times. Drink a lot of fluids and eat a balanced, healthy, nutritious diet.  Take prenatal vitamins every day. These may be prescribed for you on this visit. You may need to get them from the follow-up care provider. You can also get less expensive kinds over the counter (without a prescription). Ask you pharmacist for details.

## 2016-05-08 NOTE — ED Provider Notes (Addendum)
Physician/Midlevel provider first contact with patient: 05/08/16 1046         History     Chief Complaint   Patient presents with   . Abdominal Pain   . Decreased Fetal Movement     HPI   Patient [redacted] weeks pregnant, has had an ultrasound in the past and everything looked good and she knew she is having a girl.    She noticed decreased fetal movement a couple days ago and it continued so today it seemed to still continue, so she can emergency department for evaluation.  She also had left flank pain yesterday.   No history of IV drug abuse, immunocompromise, trauma, fever, weight loss, new cough, rash, bowel or bladder trouble, numbness or weakness.       Past Medical History:   Diagnosis Date   . Anxiety    . Bacterial vaginitis    . Childhood asthma    . Depression    . HPV in female 2013    had cervical biopsies   . Migraine    . Other and unspecified ovarian cysts     left side   . Pollen allergies    . Postpartum depression     no meds       Past Surgical History:   Procedure Laterality Date   . tubes in ears         Family History   Problem Relation Age of Onset   . Drug abuse Father    . Drug abuse Maternal Aunt    . Alcohol abuse Maternal Uncle    . Depression Maternal Uncle    . Drug abuse Maternal Grandfather        Social  Social History   Substance Use Topics   . Smoking status: Former Smoker     Packs/day: 0.50     Years: 15.00     Types: Cigarettes   . Smokeless tobacco: Never Used      Comment: trying to quit, has been smoking since age 13 up to 1 ppd   . Alcohol use No       .     Allergies   Allergen Reactions   . Amoxicillin Anaphylaxis   . Ceclor [Cefaclor] Anaphylaxis and Rash   . Macrodantin Anaphylaxis   . Penicillins Anaphylaxis   . Chocolate Nausea And Vomiting   . Flagyl [Metronidazole] Itching   . Percocet [Oxycodone-Acetaminophen] Nausea And Vomiting   . Sulfa Antibiotics Itching   . Vicodin [Hydrocodone-Acetaminophen] Hives     Passed out     . Clindamycin Rash       Home Medications     Med  List Status:  In Progress Set By: Rockey Situ, RN at 05/08/2016 10:04 AM                ondansetron (ZOFRAN-ODT) 4 MG disintegrating tablet     Take 1 tablet (4 mg total) by mouth every 6 (six) hours as needed for Nausea.           Review of Systems  Left flank pain, decreased fetal movement  Constitutional:  Denies fever or chills   Eyes:  Denies change in visual acuity or eye pain  HENT:  Denies sore throat, trouble swallowing   Respiratory:  Denies cough or shortness of breath or wheeze   Cardiovascular:  Denies chest pain or edema   GI:  Denies abdominal pain, nausea, vomiting, bloody stools or diarrhea  GU:  Denies dysuria,  hematuria, frequency or trouble urinating   Musculoskeletal:  Denies other joint pain  Integument:  Denies rash  or wound  Neurologic:  Denies headache, focal weakness, numbness, tingling, speech, vision or gait changes  Psychiatric:  Denies suicida ideation.  Physical Exam    BP: 126/64, Heart Rate: 77, Temp: 97 F (36.1 C), Resp Rate: 16, SpO2: 97 %    Physical Exam    Soft nontender abdomen.  Well-appearing  Constitutional:  Well developed, well nourished, no acute distress, not ill appearing   Eyes:   conjunctiva normal, no discharge or redness  HENT:  Atraumatic, external ears normal, nose normal, oropharynx moist, no pharyngeal exudates. Neck- normal range of motion, no tenderness, supple   Respiratory:  No respiratory distress, normal breath sounds, no rales, no wheezing, no rhonchi  Cardiovascular:  Normal rate, normal rhythm, no murmurs, no gallops, no rubs, normal radial pulses and dpp   GI:  Soft, nondistended, nontender, no organomegaly, no mass, no rebound, no guarding   GU:  No costovertebral angle tenderness   Musculoskeletal:  No edema, no tenderness, no deformities. Back- no tenderness  Integument:  Well hydrated, no rash   Lymphatic:  No prominent cervical LAD  Neurologic:  Alert & oriented x 3, normal motor function, no focal deficits noted, coordination normal    Psychiatric:  Speech and behavior appropriate       Diagnosis management comments: I, Ferdinand Cava, MD, have been the primary provider for this patient during this Emergency Dept visit.    Oxygen saturation by pulse oximetry is 95%-100%, Normal, none needed.    DDx:    Bedside ultrasound by me reveals a very active, good fetal movement, normal fetal heart tones      Although clinically and radiologically.  We see normal fetal movement.  Patient had reported decreased fetal movement  Patient Progress  Patient progress: stable      MDM and ED Course     ED Medication Orders     None             MDM      ED Course              Procedures  Results     Procedure Component Value Units Date/Time    UA with reflex to micro (pts  3 + yrs) [[id number]]  (Abnormal) Collected:  05/08/16 1129    Specimen:  Urine from Urine, Clean Catch Updated:  05/08/16 1211     Urine Type Clean Catch     Color, UA Red (A)     Clarity, UA Clear     Specific Gravity UA 1.019     Urine pH 6.0     Leukocyte Esterase, UA Negative     Nitrite, UA Negative     Protein, UR 30 (A)     Glucose, UA Negative     Ketones UA Negative     Urobilinogen, UA Normal mg/dL      Bilirubin, UA Negative     Blood, UA Negative    Microscopic, Urine [[id number]] Collected:  05/08/16 1129     Updated:  05/08/16 1211     RBC, UA 3 - 5 /hpf      WBC, UA 0 - 5 /hpf      Squamous Epithelial Cells, Urine 11 - 25 /hpf      Urine Mucus Present    Comprehensive metabolic panel [[id number]]  (Abnormal) Collected:  05/08/16 1129    Specimen:  Blood Updated:  05/08/16 1208     Glucose 97 mg/dL      BUN 6.0 (L) mg/dL      Creatinine 0.6 mg/dL      Sodium 604 mEq/L      Potassium 4.1 mEq/L      Chloride 108 mEq/L      CO2 19 (L) mEq/L      Calcium 8.8 mg/dL      Protein, Total 6.0 g/dL      Albumin 2.8 (L) g/dL      AST (SGOT) 12 U/L      ALT 13 U/L      Alkaline Phosphatase 62 U/L      Bilirubin, Total 0.3 mg/dL      Globulin 3.2 g/dL      Albumin/Globulin Ratio 0.9      Anion Gap 11.0    GFR [[id number]] Collected:  05/08/16 1129     Updated:  05/08/16 1208     EGFR >60.0    CBC with differential [[id number]]  (Abnormal) Collected:  05/08/16 1129    Specimen:  Blood from Blood Updated:  05/08/16 1144     WBC 8.66 x10 3/uL      Hgb 12.8 g/dL      Hematocrit 30.8 %      Platelets 239 x10 3/uL      RBC 4.76 x10 6/uL      MCV 80.9 fL      MCH 26.9 (L) pg      MCHC 33.2 g/dL      RDW 13 %      MPV 9.8 fL      Neutrophils 65.2 %      Lymphocytes Automated 27.8 %      Monocytes 5.2 %      Eosinophils Automated 1.3 %      Basophils Automated 0.2 %      Immature Granulocyte 0.3 %      Nucleated RBC 0.0 /100 WBC      Neutrophils Absolute 5.64 x10 3/uL      Abs Lymph Automated 2.41 x10 3/uL      Abs Mono Automated 0.45 x10 3/uL      Abs Eos Automated 0.11 x10 3/uL      Absolute Baso Automated 0.02 x10 3/uL      Absolute Immature Granulocyte 0.03 x10 3/uL      Absolute NRBC 0.00 x10 3/uL           Clinical Impression & Disposition     Clinical Impression  Final diagnoses:   Left flank pain   Decreased fetal movements in second trimester, single or unspecified fetus        ED Disposition     ED Disposition Condition Date/Time Comment    Discharge  Sun May 08, 2016 12:21 PM Claiborne Billings discharge to home/self care.    Condition at disposition: Stable           Discharge Medication List as of 05/08/2016 12:21 PM                    Ferdinand Cava, MD  05/09/16 2145       Ferdinand Cava, MD  05/09/16 2145

## 2016-05-26 ENCOUNTER — Observation Stay: Payer: Medicaid Other

## 2016-05-26 ENCOUNTER — Observation Stay
Admission: EM | Admit: 2016-05-26 | Discharge: 2016-05-26 | Disposition: A | Discharge status: Home / Self Care | Payer: Medicaid Other | Attending: Obstetrics & Gynecology | Admitting: Obstetrics & Gynecology

## 2016-05-26 DIAGNOSIS — R109 Unspecified abdominal pain: Secondary | ICD-10-CM | POA: Insufficient documentation

## 2016-05-26 DIAGNOSIS — G8929 Other chronic pain: Secondary | ICD-10-CM | POA: Insufficient documentation

## 2016-05-26 DIAGNOSIS — Z87891 Personal history of nicotine dependence: Secondary | ICD-10-CM | POA: Insufficient documentation

## 2016-05-26 DIAGNOSIS — O3483 Maternal care for other abnormalities of pelvic organs, third trimester: Secondary | ICD-10-CM | POA: Insufficient documentation

## 2016-05-26 DIAGNOSIS — N83201 Unspecified ovarian cyst, right side: Secondary | ICD-10-CM | POA: Insufficient documentation

## 2016-05-26 DIAGNOSIS — R1084 Generalized abdominal pain: Secondary | ICD-10-CM

## 2016-05-26 DIAGNOSIS — O26892 Other specified pregnancy related conditions, second trimester: Principal | ICD-10-CM | POA: Insufficient documentation

## 2016-05-26 DIAGNOSIS — Z3A19 19 weeks gestation of pregnancy: Secondary | ICD-10-CM | POA: Insufficient documentation

## 2016-05-26 HISTORY — DX: Unspecified ovarian cyst, unspecified side: N83.209

## 2016-05-26 LAB — CBC AND DIFFERENTIAL
Absolute NRBC: 0 10*3/uL
Basophils Absolute Automated: 0.01 10*3/uL (ref 0.00–0.20)
Basophils Automated: 0.1 %
Eosinophils Absolute Automated: 0.19 10*3/uL (ref 0.00–0.70)
Eosinophils Automated: 1.8 %
Hematocrit: 35.2 % — ABNORMAL LOW (ref 37.0–47.0)
Hgb: 11.6 g/dL — ABNORMAL LOW (ref 12.0–16.0)
Immature Granulocytes Absolute: 0.04 10*3/uL
Immature Granulocytes: 0.4 %
Lymphocytes Absolute Automated: 3.05 10*3/uL (ref 0.50–4.40)
Lymphocytes Automated: 28.3 %
MCH: 26.8 pg — ABNORMAL LOW (ref 28.0–32.0)
MCHC: 33 g/dL (ref 32.0–36.0)
MCV: 81.3 fL (ref 80.0–100.0)
MPV: 9.5 fL (ref 9.4–12.3)
Monocytes Absolute Automated: 0.52 10*3/uL (ref 0.00–1.20)
Monocytes: 4.8 %
Neutrophils Absolute: 6.98 10*3/uL (ref 1.80–8.10)
Neutrophils: 64.6 %
Nucleated RBC: 0 /100 WBC (ref 0.0–1.0)
Platelets: 222 10*3/uL (ref 140–400)
RBC: 4.33 10*6/uL (ref 4.20–5.40)
RDW: 13 % (ref 12–15)
WBC: 10.79 10*3/uL (ref 3.50–10.80)

## 2016-05-26 LAB — URINALYSIS WITH MICROSCOPIC
Bilirubin, UA: NEGATIVE
Blood, UA: NEGATIVE
Glucose, UA: NEGATIVE
Ketones UA: 20 — AB
Leukocyte Esterase, UA: NEGATIVE
Nitrite, UA: NEGATIVE
Protein, UR: NEGATIVE
Specific Gravity UA: 1.012 (ref 1.001–1.035)
Urine pH: 7 (ref 5.0–8.0)
Urobilinogen, UA: NORMAL mg/dL

## 2016-05-26 NOTE — Discharge Instr - AVS First Page (Addendum)
Reason for your Hospital Admission:  Abdominal pain      Instructions for after your discharge  Call your doctor if:     Contractions become stronger and regular every 5 minutes for one hour   Bag of water ruptures (Sudden gush or continuous leak)   Vaginal bleeding occurs (bright red, running down legs or with clots)                           spotting after an exam is normal   You experience any unusually sharp abdominal pains   Decrease in fetal movement   Any persistent, severe headaches   Blurring of vision or spots before the eyes   Sudden nausea and vomiting   Sudden increase in swelling of the hands, feet or face   Any chills or fever over 100.4 degrees   Frequent urination or burning on urination        Any further concerns or questions.    Keep your next doctor's visit as previously scheduled    Patient Discharged to home.

## 2016-05-26 NOTE — ED Provider Notes (Signed)
Physician/Midlevel provider first contact with patient: 05/26/16 1148         EMERGENCY DEPARTMENT HISTORY AND PHYSICAL EXAM    Patient Name: Catherine Spence, Catherine Spence  Encounter Date:  05/26/2016  Attending Physician: Elray Mcgregor, MD  Patient DOB:  January 18, 1991  MRN:  [id number]  Room:  FOLDPACU/FOLDPACU.05    History of Presenting Illness     Historian: Patient    26 y.o. female [redacted] weeks pregnant, with h/o Anxiety, Bacterial vaginitis, HPV, and post-partum depression p/w gradually worsening suprapubic abdominal pain that radiates to the back since this AM at  ~10:00. Pain is exacerbated by movement. Associated with nausea. Pt reports a brief episode of similar sxs upon waking up this AM at ~08:30 that resolved after urinating. Unsure of LMP. Denies vomiting.    PMD:  Rolene Arbour, MD    Nursing Notes Review  Nursing Notes were reviewed.     Previous Records Review  Previous records were reviewed to the extent practicable for the current presentation.    Past Medical History     Past Medical History:   Diagnosis Date   . Anxiety    . Bacterial vaginitis    . Childhood asthma    . Depression    . HPV in female 2013    had cervical biopsies   . Migraine    . Other and unspecified ovarian cysts     left side   . Ovarian cyst    . Pollen allergies    . Postpartum depression     no meds       No current facility-administered medications for this encounter.     Allergies   Allergen Reactions   . Amoxicillin Anaphylaxis   . Ceclor [Cefaclor] Anaphylaxis and Rash   . Macrodantin Anaphylaxis   . Penicillins Anaphylaxis   . Chocolate Nausea And Vomiting   . Flagyl [Metronidazole] Itching   . Percocet [Oxycodone-Acetaminophen] Nausea And Vomiting   . Sulfa Antibiotics Itching   . Vicodin [Hydrocodone-Acetaminophen] Hives     Passed out     . Clindamycin Rash       Medical history, medications, and allergies reviewed.     Past Surgical History     Past Surgical History:   Procedure Laterality Date   . tubes in ears          Family History   The family history is not significantly contributory to current presentation.   Family History   Problem Relation Age of Onset   . Drug abuse Father    . Drug abuse Maternal Aunt    . Alcohol abuse Maternal Uncle    . Depression Maternal Uncle    . Drug abuse Maternal Grandfather        Social History   Social history is not significantly contributory to the patient's current presentation.   Social History     Social History   . Marital status: Single     Spouse name: N/A   . Number of children: N/A   . Years of education: N/A     Social History Main Topics   . Smoking status: Former Smoker     Packs/day: 0.50     Years: 15.00     Types: Cigarettes   . Smokeless tobacco: Never Used      Comment: trying to quit, has been smoking since age 29 up to 1 ppd   . Alcohol use No   . Drug use: No  Comment: Hx of multiple drug abuse including cocaine, marijuana   . Sexual activity: Yes     Partners: Male     Other Topics Concern   . Not on file     Social History Narrative   . No narrative on file       Home Medications     Home medications reviewed by ED MD     Current Discharge Medication List      CONTINUE these medications which have NOT CHANGED    Details   ondansetron (ZOFRAN-ODT) 4 MG disintegrating tablet Take 1 tablet (4 mg total) by mouth every 6 (six) hours as needed for Nausea.  Qty: 8 tablet, Refills: 0             Review of Systems     See HPI for review of systems that is relevant to the current presentation.   All other systems reviewed: negative.     Physical Exam     Vital Signs  BP 119/70   Pulse 82   Temp 97.8 F (36.6 C)   Resp 18   Ht 5\' 6"  (1.676 m)   Wt 108.9 kg   LMP 01/18/2016   SpO2 99%   BMI 38.74 kg/m     Review of Vital Signs  The patient's vital signs and oxygen saturation were reviewed and interpreted by me, Elray Mcgregor, MD.    Physical Exam    Constitutional: No acute distress. Coloration not ashen.   Eyes: No conjunctival discharge. EOMI.   Head,  Ears, Nose, Mouth, Throat: Normocephalic. Moist mucous membranes.   Neck: Full range of motion. No obvious neck deformities. No tracheal deviation.   Cardiovascular: No obvious gallops. Normal capillary refill. Strong peripheral pulses.  Respiratory/Chest: No obvious chest wall asymmetry. No resp distress.   Gastrointestinal/Abdominal: Mild diffuse non-localizing tenderness. No rigidity, no guarding. + bowel sounds. No peritoneal signs, benign exam. Her BMI makes uterine palpation challenging.   Musculoskeletal: Normal ROM. No focal extremity tenderness.   Back: No deformity. No significant scoliosis.   Neurological: Alert. No acute focal deficits.   Skin: Warm skin. No acute rash. Not mottled.      ED Medications Administered     ED Medication Orders     None          Orders Placed During This Encounter     Orders Placed This Encounter   Procedures   . CBC and differential   . Urinalysis with microscopic   . Diet NPO effective now   . Vital signs ( per Unit Protocol for L&D Triage Protocol)   . Assess fetal heart tones (Fetal Heart tones via doppler) Intermittent per protocol (If patient is less than 24 weeks)   . Bed rest with bathroom privileges   . Notify physician   . Full Code   . Place in Observation Services   . US OB Limited       Diagnostic Study Results     The results of the diagnostic studies below were reviewed by the ED provider:    Labs  Results     Procedure Component Value Units Date/Time    CBC and differential [[id number]]  (Abnormal) Collected:  05/26/16 1449    Specimen:  Blood from Blood Updated:  05/26/16 1454     WBC 10.79 x10 3/uL      Hgb 11.6 (L) g/dL      Hematocrit 40.9 (L) %      Platelets 222 x10  3/uL      RBC 4.33 x10 6/uL      MCV 81.3 fL      MCH 26.8 (L) pg      MCHC 33.0 g/dL      RDW 13 %      MPV 9.5 fL      Neutrophils 64.6 %      Lymphocytes Automated 28.3 %      Monocytes 4.8 %      Eosinophils Automated 1.8 %      Basophils Automated 0.1 %      Immature Granulocyte 0.4 %       Nucleated RBC 0.0 /100 WBC      Neutrophils Absolute 6.98 x10 3/uL      Abs Lymph Automated 3.05 x10 3/uL      Abs Mono Automated 0.52 x10 3/uL      Abs Eos Automated 0.19 x10 3/uL      Absolute Baso Automated 0.01 x10 3/uL      Absolute Immature Granulocyte 0.04 x10 3/uL      Absolute NRBC 0.00 x10 3/uL     Urinalysis with microscopic [[id number]]  (Abnormal) Collected:  05/26/16 1312    Specimen:  Urine Updated:  05/26/16 1329     Urine Type Clean Catch     Color, UA Yellow     Clarity, UA Clear     Specific Gravity UA 1.012     Urine pH 7.0     Leukocyte Esterase, UA Negative     Nitrite, UA Negative     Protein, UR Negative     Glucose, UA Negative     Ketones UA 20 (A)     Urobilinogen, UA Normal mg/dL      Bilirubin, UA Negative     Blood, UA Negative     RBC, UA 0 - 2 /hpf      WBC, UA 0 - 5 /hpf      Squamous Epithelial Cells, Urine 6 - 10 /hpf           Radiologic Studies  Radiology Results (24 Hour)     Procedure Component Value Units Date/Time    US OB Limited [[id number]] Collected:  05/26/16 1401    Order Status:  Completed Updated:  05/26/16 1413    Narrative:       History: G4 P2. EGA [redacted] weeks 3 days. Right lower quadrant abdominal  pain. EDD 10/17/2016    COMPARISON: Ultrasound 04/13/2016    POSITION: Breech     BPD:                      4.5 cm    19 weeks 5 days   Head Circumference:      17.2 cm    19 weeks 5 days   Abdominal Circumference: 14.2 cm    19 weeks 4 days   Femur Length:             3.1 cm    19 weeks 4 days     Average gestational age by ultrasound 19 weeks 5 days     ACTIVITY: Normal heart rate 135  BPM and normal rhythm. Normal gross  body movement.    FETAL ANATOMY: The following structures were visualized:  four chamber heart in normal position  fluid present within a normally positioned stomach  bladder filling, no significant renal abnormality  three vessel cord, no abnormality in the area of the ACI    AMNIOTIC  FLUID: Within normal limits.    PLACENTA: Anterior position.  Unremarkable texture. No separation or  previa.    CERVIX: 4.7 cm. No evidence of dilatation.    UTERUS/ADNEXA: There is a cyst on the right ovary measuring 3.3 x 2.7 x  3.5 cm. This is improved in size compared to prior (7.8 x 4.6 x 2.8  cm)..      Impression:          1.  Single live intrauterine pregnancy with mean gestational age [redacted]  weeks 5 days.  2.  Size equal to dates. Decreased size right ovarian cyst. No other  abnormality seen    Genelle Bal, MD   05/26/2016 2:09 PM          Scribe and MD Attestations     I, Elray Mcgregor, MD, personally performed the services documented. Mauri Pole is scribing for me on [facility]. I reviewed and confirm the accuracy of the information in this medical record.     I, Mauri Pole, am serving as a Neurosurgeon to document services personally performed by Elray Mcgregor, MD, based on the provider's statements to me.     Rendering Provider: Elray Mcgregor, MD      MDM and Clinical Notes       Hx & Exam Synthesis, Differential Diagnosis, and Plan     MDM: 20 weeks 0 days pregnant based on due date of 10/13/16. By policy, she is to go upstairs to L&D for evaluation. They will evaluate her there. No signs of acute intraabdominal emergency based on her exam. Pt declined any pain and nausea medications.       ED Course, Monitors, EKG, Critical Care, Splints, Consults, Reevaluation, etc       Notes:    Pt has had ~12 visits to the ED in the past year usually for abdominal sxs.     12:36: Pt was evaluated by labor and delivery.      Diagnosis and Disposition     Clinical Impression  1. Generalized abdominal pain        Disposition  ED Disposition     ED Disposition Condition Date/Time Comment    Send to L&D  Thu May 26, 2016 11:58 AM           Prescriptions       Current Discharge Medication List                 Elray Mcgregor, MD  05/26/16 [id number]

## 2016-05-26 NOTE — H&P (Signed)
Subjective:   Catherine Spence is a 26 y.o. [id number] female at [redacted]w[redacted]d, Estimated Date of Delivery: 10/17/16.  She is beingseen for abdominal pain.  Started today, sharp in her RLQ radiating to her legs. Worse with movement and standing.  Denies nausa, vomiting. Has had a normal bowel movememt today and able to eat.  Denies fever, chills.  Is presently between practices. Says she saw Dr Yetta Barre at [facility] 3 times and her USN correlated with her LMP .  This is her third pregnancy and she has had 2 normal SVD.  Her current obstetrical history is significant for other wise normal.  Patient reports no bleeding, no contractions, no cramping and no leaking.   Fetal Movement: normal.    Objective:    Vital signs in last 24 hours:  Temp:  [97.8 F (36.6 C)] 97.8 F (36.6 C)  Heart Rate:  [82] 82  Resp Rate:  [18] 18  BP: (119)/(70) 119/70     FHTS: 130 c/w 20 weeks  Toco:      General:   alert, appears stated age, cooperative and no distress   Lungs:   clear to auscultation bilaterally CVA angles nontender   Heart:   regular rate and rhythm, S1, S2 normal, no murmur, click, rub or gallop   Abdomen:  protuberant, not distended no rebound rigidity or guarding.  BS somewhat decreased.  Actually tender all over but worse in the RLQ   SVE:  deferred     Lab Review:  Lab Results   Component Value Date    ABORH O POS 12/17/2012    RUBELLAABIGG Immune 04/28/2012    HEPBSAG Negative 08/25/2008    GBS Negative 11/23/2012        Assessment:   26 y.o. [id number] female at [redacted]w[redacted]d   Abdominal pain etiology unsure.  Eval in ER did not reveal any surgical abdomen   Chronic abdominal pain with 12 admissions for abdominal complaints in the past year.      Problem List:  Patient Active Problem List   Diagnosis   . Abdominal pain complicating pregnancy   . Umbilical hernia   . Pregnancy   . [redacted] weeks gestation of pregnancy   . SOB (shortness of breath)   . Post term pregnancy   . Abdominal pain       Plan:    1. Abdominal and pelvic (OB) USN  2.   CBC  3.  UA     When labs back will reasses and determine management.       Historical Data:  Past Medical History:   Diagnosis Date   . Anxiety    . Bacterial vaginitis    . Childhood asthma    . Depression    . HPV in female 2013    had cervical biopsies   . Migraine    . Other and unspecified ovarian cysts     left side   . Ovarian cyst    . Pollen allergies    . Postpartum depression     no meds     Past Surgical History:   Procedure Laterality Date   . tubes in ears       OB History   Gravida Para Term Preterm AB Living   4 2 2   1 2    SAB TAB Ectopic Multiple Live Births   1       2      # Outcome Date GA Lbr Len/2nd Weight Sex Delivery  Anes PTL Lv   4 Current            3 Term 12/17/12 [redacted]w[redacted]d  3.295 kg (7 lb 4.2 oz) F Vag-Spont EPI N LIV   2 Term 04/08/11 [redacted]w[redacted]d  2.892 kg (6 lb 6 oz) M Vag-Spont   LIV      Birth Comments: UTI in pregnancy   1 SAB 2011 [redacted]w[redacted]d             Birth Comments: vacuum done in ED

## 2016-05-26 NOTE — ED Triage Notes (Signed)
[redacted] weeks pregnant, pt states having pressure feeling left over from bout of abd pain.  Denies  vag bleeding. Onset today.

## 2016-05-26 NOTE — Progress Notes (Signed)
Down to ultra sound.

## 2016-05-26 NOTE — Progress Notes (Signed)
Dr. Alice Rieger spoke with patient about lab results and radiology results.  Ok to d/c to home.  Patient d/c'd to home with written and verbal instructions.  Patient states she will follow up with either Dr. Yetta Barre or Dr. Dorinda Hill.  States understanding of instructions and that she is able to care for herself at home.

## 2016-05-26 NOTE — Progress Notes (Signed)
UA sent per order, orders obtained for ultrasound and CBC.

## 2016-05-26 NOTE — Progress Notes (Signed)
Patient admitted to triage from -the ER.  Patient states she is having LLQ pain that is constant with pressure.  States that when she lies down it goes away and it is exacerbated with movement.  Medical and surgical history obtained.  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 Yetta Barre out of [facility].  States Dr. Dorinda Hill delivered her other babies and she wants to go back to her.  When I called Dr. Dorinda Hill she stated that she has not seen this patient for this pregnancy and that I should have the hospitalist see her.  Contacted Dr. Alice Rieger to evaluate patient.

## 2016-05-26 NOTE — Progress Notes (Signed)
All labs and USN normal: has a small ovarian cyst 3 cms which ismaller than in previoius scans.    Advised to call if increased or worse sx like fever, nausea , vomiting  May uise NSAIDs in the midrtinmester if needed    Has follow up already scheduled with Dr Yetta Barre and Dr Mickle Mallory

## 2016-05-31 ENCOUNTER — Ambulatory Visit
Admission: RE | Admit: 2016-05-31 | Discharge: 2016-05-31 | Disposition: A | Discharge status: Home / Self Care | Payer: Medicaid Other | Source: Ambulatory Visit | Attending: Specialist | Admitting: Specialist

## 2016-05-31 DIAGNOSIS — Z3A20 20 weeks gestation of pregnancy: Secondary | ICD-10-CM | POA: Insufficient documentation

## 2016-05-31 DIAGNOSIS — O99212 Obesity complicating pregnancy, second trimester: Secondary | ICD-10-CM | POA: Insufficient documentation

## 2016-05-31 DIAGNOSIS — Z369 Encounter for antenatal screening, unspecified: Secondary | ICD-10-CM | POA: Insufficient documentation

## 2016-05-31 DIAGNOSIS — E669 Obesity, unspecified: Secondary | ICD-10-CM | POA: Insufficient documentation

## 2016-05-31 DIAGNOSIS — Z3689 Encounter for other specified antenatal screening: Secondary | ICD-10-CM

## 2016-07-22 ENCOUNTER — Other Ambulatory Visit: Payer: Self-pay | Admitting: Specialist

## 2016-07-22 DIAGNOSIS — O2610 Low weight gain in pregnancy, unspecified trimester: Secondary | ICD-10-CM

## 2016-07-26 ENCOUNTER — Ambulatory Visit
Admission: RE | Admit: 2016-07-26 | Discharge: 2016-07-26 | Disposition: A | Discharge status: Home / Self Care | Payer: Medicaid Other | Source: Ambulatory Visit | Attending: Specialist | Admitting: Specialist

## 2016-07-26 DIAGNOSIS — O2613 Low weight gain in pregnancy, third trimester: Secondary | ICD-10-CM | POA: Insufficient documentation

## 2016-07-26 DIAGNOSIS — O2610 Low weight gain in pregnancy, unspecified trimester: Secondary | ICD-10-CM

## 2016-07-26 DIAGNOSIS — Z3A28 28 weeks gestation of pregnancy: Secondary | ICD-10-CM | POA: Insufficient documentation

## 2016-08-09 ENCOUNTER — Emergency Department: Payer: Medicaid Other

## 2016-08-09 ENCOUNTER — Emergency Department
Admission: EM | Admit: 2016-08-09 | Discharge: 2016-08-09 | Disposition: A | Discharge status: Home / Self Care | Payer: Medicaid Other | Attending: Obstetrics and Gynecology | Admitting: Obstetrics and Gynecology

## 2016-08-09 DIAGNOSIS — O99333 Smoking (tobacco) complicating pregnancy, third trimester: Secondary | ICD-10-CM | POA: Insufficient documentation

## 2016-08-09 DIAGNOSIS — F17210 Nicotine dependence, cigarettes, uncomplicated: Secondary | ICD-10-CM | POA: Insufficient documentation

## 2016-08-09 DIAGNOSIS — O9989 Other specified diseases and conditions complicating pregnancy, childbirth and the puerperium: Secondary | ICD-10-CM | POA: Insufficient documentation

## 2016-08-09 DIAGNOSIS — R109 Unspecified abdominal pain: Secondary | ICD-10-CM | POA: Insufficient documentation

## 2016-08-09 DIAGNOSIS — Z3A30 30 weeks gestation of pregnancy: Secondary | ICD-10-CM | POA: Insufficient documentation

## 2016-08-09 DIAGNOSIS — O26899 Other specified pregnancy related conditions, unspecified trimester: Secondary | ICD-10-CM
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
## 2016-08-09 NOTE — Progress Notes (Signed)
Patient discharged to home in stable condition. Given written and verbal discharge instructions to include labor precautions and kick counts, verbalized understanding.
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
## 2016-09-13 LAB — GROUP B STREP TRANSCRIBED: GBS Transcribed: NEGATIVE

## 2016-09-19 ENCOUNTER — Observation Stay
Admission: RE | Admit: 2016-09-19 | Discharge: 2016-09-20 | Disposition: A | Discharge status: Home / Self Care | Payer: Medicaid Other | Source: Ambulatory Visit | Attending: Obstetrics & Gynecology | Admitting: Obstetrics & Gynecology

## 2016-09-19 DIAGNOSIS — Z3A00 Weeks of gestation of pregnancy not specified: Secondary | ICD-10-CM | POA: Insufficient documentation

## 2016-09-19 DIAGNOSIS — R109 Unspecified abdominal pain: Secondary | ICD-10-CM | POA: Insufficient documentation

## 2016-09-19 DIAGNOSIS — O26899 Other specified pregnancy related conditions, unspecified trimester: Principal | ICD-10-CM | POA: Insufficient documentation

## 2016-09-19 DIAGNOSIS — R52 Pain, unspecified: Secondary | ICD-10-CM | POA: Diagnosis present

## 2016-09-19 HISTORY — DX: Umbilical hernia without obstruction or gangrene: K42.9

## 2016-09-20 DIAGNOSIS — R52 Pain, unspecified: Secondary | ICD-10-CM | POA: Diagnosis present

## 2016-09-20 MED ORDER — PROMETHAZINE HCL 25 MG PO TABS
12.5000 mg | ORAL_TABLET | Freq: Four times a day (QID) | ORAL | Status: DC | PRN
Start: 2016-09-20 — End: 2016-09-20

## 2016-09-20 MED ORDER — PROMETHAZINE HCL 25 MG/ML IJ SOLN
6.2500 mg | Freq: Four times a day (QID) | INTRAMUSCULAR | Status: DC | PRN
Start: 2016-09-20 — End: 2016-09-20

## 2016-09-20 MED ORDER — ACETAMINOPHEN 325 MG PO TABS
650.0000 mg | ORAL_TABLET | ORAL | Status: DC | PRN
Start: 2016-09-20 — End: 2016-09-20

## 2016-09-20 MED ORDER — ACETAMINOPHEN 650 MG RE SUPP
650.0000 mg | RECTAL | Status: DC | PRN
Start: 2016-09-20 — End: 2016-09-20

## 2016-09-20 MED ORDER — ONDANSETRON HCL 4 MG/2ML IJ SOLN
4.0000 mg | Freq: Three times a day (TID) | INTRAMUSCULAR | Status: DC | PRN
Start: 2016-09-20 — End: 2016-09-20

## 2016-09-20 MED ORDER — PROMETHAZINE HCL 25 MG RE SUPP
12.5000 mg | Freq: Four times a day (QID) | RECTAL | Status: DC | PRN
Start: 2016-09-20 — End: 2016-09-20

## 2016-09-20 MED ORDER — ONDANSETRON 4 MG PO TBDP
4.0000 mg | ORAL_TABLET | Freq: Three times a day (TID) | ORAL | Status: DC | PRN
Start: 2016-09-20 — End: 2016-09-20

## 2016-09-20 NOTE — Progress Notes (Signed)
G4P2 to Labor and Delivery triage with vague complaints of abdominal pain and back pain and pain around her umbilical hernia.  Pt seen earlier today by Dr. Dayton Scrape and was discharged home from his office.  EFM applied; assessment WNL. Dr. Dorinda Hill contacted about pt's arrival; orders rec'd to r/o labor and discharge pt if not in labor.  Pt to follow up with Dr. Dorinda Hill in the morning at her office.

## 2016-09-20 NOTE — Progress Notes (Signed)
Cervix closed.  Pt given labor discharge instructions and told to follow up with Dr. Dorinda Hill in her office tomorrow.  Pt and her mother verbalize understanding.

## 2016-09-20 NOTE — Discharge Instr - AVS First Page (Signed)
Reason for your Hospital Admission:  Rule out labor; abdominal pain    Instructions for after your discharge:  Follow- up with Dr. Dorinda Hill in the morning      Call your doctor if:   Contractions become stronger and regular every 5-10 minutes for one hour.   Bag of water ruptures. (Sudden gush of fluid or continuous leak)   Vaginal bleeding occurs (bright red, running down legs or passed blood clots).    Spotting after a vaginal exam or mucous bloody show is normal.   You experience any unusually sharp abdominal pain.   You notice a decrease in fetal movement.  If you notice a decrease in the fetal movement.  Get something to eat and drink, lay on your side, place you had on your abdomen and perform your fetal kick counts.  Have this information available for your MD.   You have persistent severe headaches.   You have blurred vision or spots before the eyes.   You have persistent nausea and vomiting.   You have sudden increase swelling of hands, feet, face, and ankles (if suddenly your rings are too tight).   You have chills and a fever equalled to or greater than 100.4 (not accompanied by a cold).   You have very frequent urination or burning with urination.   You have further concerns or questions.

## 2016-09-22 ENCOUNTER — Ambulatory Visit: Payer: Self-pay

## 2016-10-03 ENCOUNTER — Inpatient Hospital Stay: Admission: RE | Admit: 2016-10-03 | Payer: Medicaid Other | Source: Ambulatory Visit

## 2016-10-04 ENCOUNTER — Inpatient Hospital Stay
Admission: RE | Admit: 2016-10-04 | Discharge: 2016-10-06 | DRG: 560 | Disposition: A | Discharge status: Home / Self Care | Payer: Medicaid Other | Source: Ambulatory Visit | Attending: Obstetrics & Gynecology | Admitting: Obstetrics & Gynecology

## 2016-10-04 ENCOUNTER — Observation Stay: Payer: Medicaid Other | Admitting: Pain Medicine

## 2016-10-04 ENCOUNTER — Inpatient Hospital Stay
Admission: RE | Admit: 2016-10-04 | Discharge: 2016-10-04 | Disposition: A | Discharge status: Home / Self Care | Payer: Medicaid Other | Source: Ambulatory Visit

## 2016-10-04 DIAGNOSIS — K429 Umbilical hernia without obstruction or gangrene: Secondary | ICD-10-CM | POA: Diagnosis present

## 2016-10-04 DIAGNOSIS — F17210 Nicotine dependence, cigarettes, uncomplicated: Secondary | ICD-10-CM | POA: Diagnosis present

## 2016-10-04 DIAGNOSIS — Z3A38 38 weeks gestation of pregnancy: Secondary | ICD-10-CM

## 2016-10-04 DIAGNOSIS — O9962 Diseases of the digestive system complicating childbirth: Principal | ICD-10-CM | POA: Diagnosis present

## 2016-10-04 DIAGNOSIS — O99334 Smoking (tobacco) complicating childbirth: Secondary | ICD-10-CM | POA: Diagnosis present

## 2016-10-04 DIAGNOSIS — Z23 Encounter for immunization: Secondary | ICD-10-CM

## 2016-10-04 LAB — CBC AND DIFFERENTIAL
Absolute NRBC: 0 10*3/uL
Basophils Absolute Automated: 0.01 10*3/uL (ref 0.00–0.20)
Basophils Automated: 0.1 %
Eosinophils Absolute Automated: 0.13 10*3/uL (ref 0.00–0.70)
Eosinophils Automated: 1 %
Hematocrit: 31.4 % — ABNORMAL LOW (ref 37.0–47.0)
Hgb: 10.2 g/dL — ABNORMAL LOW (ref 12.0–16.0)
Immature Granulocytes Absolute: 0.09 10*3/uL — ABNORMAL HIGH
Immature Granulocytes: 0.7 %
Lymphocytes Absolute Automated: 2.76 10*3/uL (ref 0.50–4.40)
Lymphocytes Automated: 20.3 %
MCH: 25 pg — ABNORMAL LOW (ref 28.0–32.0)
MCHC: 32.5 g/dL (ref 32.0–36.0)
MCV: 77 fL — ABNORMAL LOW (ref 80.0–100.0)
MPV: 9.8 fL (ref 9.4–12.3)
Monocytes Absolute Automated: 0.82 10*3/uL (ref 0.00–1.20)
Monocytes: 6 %
Neutrophils Absolute: 9.78 10*3/uL — ABNORMAL HIGH (ref 1.80–8.10)
Neutrophils: 71.9 %
Nucleated RBC: 0 /100 WBC (ref 0.0–1.0)
Platelets: 263 10*3/uL (ref 140–400)
RBC: 4.08 10*6/uL — ABNORMAL LOW (ref 4.20–5.40)
RDW: 14 % (ref 12–15)
WBC: 13.59 10*3/uL — ABNORMAL HIGH (ref 3.50–10.80)

## 2016-10-04 LAB — RAPID DRUG SCREEN, URINE
Barbiturate Screen, UR: NEGATIVE
Benzodiazepine Screen, UR: NEGATIVE
Cannabinoid Screen, UR: NEGATIVE
Cocaine, UR: NEGATIVE
Opiate Screen, UR: NEGATIVE
PCP Screen, UR: NEGATIVE
Urine Amphetamine Screen: NEGATIVE

## 2016-10-04 LAB — HEPATITIS B SURFACE ANTIGEN W/ REFLEX TO CONFIRMATION: Hepatitis B Surface Antigen: NONREACTIVE

## 2016-10-04 LAB — HEMOLYSIS INDEX: Hemolysis Index: 3 (ref 0–18)

## 2016-10-04 LAB — HIV RAPID
Rapid HIV-1 p24 Antigen: NONREACTIVE
Rapid HIV-1/2  Antibody: NONREACTIVE

## 2016-10-04 LAB — TYPE AND SCREEN
AB Screen Gel: NEGATIVE
ABO Rh: O POS

## 2016-10-04 MED ORDER — ONDANSETRON HCL 4 MG/2ML IJ SOLN
4.0000 mg | Freq: Three times a day (TID) | INTRAMUSCULAR | Status: DC | PRN
Start: 2016-10-04 — End: 2016-10-06

## 2016-10-04 MED ORDER — OXYTOCIN-SODIUM CHLORIDE 30-0.9 UT/500ML-% IV SOLN
2.0000 m[IU]/min | INTRAVENOUS | Status: DC
Start: 2016-10-04 — End: 2016-10-04

## 2016-10-04 MED ORDER — OXYTOCIN-SODIUM CHLORIDE 30-0.9 UT/500ML-% IV SOLN
7.5000 [IU]/h | INTRAVENOUS | Status: DC
Start: 2016-10-04 — End: 2016-10-04
  Administered 2016-10-04: 7.5 [IU]/h via INTRAVENOUS

## 2016-10-04 MED ORDER — METHYLERGONOVINE MALEATE 0.2 MG/ML IJ SOLN
200.0000 ug | INTRAMUSCULAR | Status: DC | PRN
Start: 2016-10-04 — End: 2016-10-06

## 2016-10-04 MED ORDER — HYDROCORTISONE 1 % EX OINT
TOPICAL_OINTMENT | Freq: Three times a day (TID) | CUTANEOUS | Status: DC | PRN
Start: 2016-10-04 — End: 2016-10-06

## 2016-10-04 MED ORDER — ACETAMINOPHEN 325 MG PO TABS
650.0000 mg | ORAL_TABLET | ORAL | Status: DC | PRN
Start: 2016-10-04 — End: 2016-10-04
  Administered 2016-10-04: 650 mg via ORAL
  Filled 2016-10-04: qty 2

## 2016-10-04 MED ORDER — MISOPROSTOL 100 MCG PO TABS
50.0000 ug | ORAL_TABLET | ORAL | Status: DC
Start: 2016-10-04 — End: 2016-10-04
  Administered 2016-10-04: 50 ug via VAGINAL
  Filled 2016-10-04: qty 1

## 2016-10-04 MED ORDER — PROMETHAZINE HCL 25 MG/ML IJ SOLN
6.2500 mg | Freq: Four times a day (QID) | INTRAMUSCULAR | Status: DC | PRN
Start: 2016-10-04 — End: 2016-10-04

## 2016-10-04 MED ORDER — PROMETHAZINE HCL 25 MG PO TABS
12.5000 mg | ORAL_TABLET | Freq: Four times a day (QID) | ORAL | Status: DC | PRN
Start: 2016-10-04 — End: 2016-10-04

## 2016-10-04 MED ORDER — PROMETHAZINE HCL 25 MG PO TABS
12.5000 mg | ORAL_TABLET | Freq: Four times a day (QID) | ORAL | Status: DC | PRN
Start: 2016-10-04 — End: 2016-10-06

## 2016-10-04 MED ORDER — FAMOTIDINE 10 MG/ML IV SOLN (WRAP)
20.0000 mg | Freq: Once | INTRAVENOUS | Status: DC | PRN
Start: 2016-10-04 — End: 2016-10-06

## 2016-10-04 MED ORDER — PROMETHAZINE HCL 25 MG RE SUPP
12.5000 mg | Freq: Four times a day (QID) | RECTAL | Status: DC | PRN
Start: 2016-10-04 — End: 2016-10-06

## 2016-10-04 MED ORDER — MISOPROSTOL 200 MCG PO TABS
800.0000 ug | ORAL_TABLET | Freq: Once | ORAL | Status: DC | PRN
Start: 2016-10-04 — End: 2016-10-06

## 2016-10-04 MED ORDER — DIPHENHYDRAMINE HCL 50 MG/ML IJ SOLN
12.5000 mg | Freq: Four times a day (QID) | INTRAMUSCULAR | Status: DC | PRN
Start: 2016-10-04 — End: 2016-10-06

## 2016-10-04 MED ORDER — IBUPROFEN 600 MG PO TABS
600.0000 mg | ORAL_TABLET | Freq: Four times a day (QID) | ORAL | Status: DC | PRN
Start: 2016-10-04 — End: 2016-10-06
  Administered 2016-10-05 – 2016-10-06 (×7): 600 mg via ORAL
  Filled 2016-10-04 (×8): qty 1

## 2016-10-04 MED ORDER — LIDOCAINE HCL (PF) 2 % IJ SOLN
INTRAMUSCULAR | Status: DC | PRN
Start: 2016-10-04 — End: 2016-10-04
  Administered 2016-10-04: 3 mL via INTRADERMAL

## 2016-10-04 MED ORDER — MEASLES, MUMPS & RUBELLA VAC SC INJ
0.5000 mL | INJECTION | SUBCUTANEOUS | Status: DC | PRN
Start: 2016-10-04 — End: 2016-10-06

## 2016-10-04 MED ORDER — LACTATED RINGERS IV BOLUS
1000.0000 mL | Freq: Once | INTRAVENOUS | Status: DC
Start: 2016-10-04 — End: 2016-10-04

## 2016-10-04 MED ORDER — TETANUS-DIPHTH-ACELL PERTUSSIS 5-2.5-18.5 LF-MCG/0.5 IM SUSP
0.5000 mL | INTRAMUSCULAR | Status: AC | PRN
Start: 2016-10-04 — End: 2016-10-06
  Administered 2016-10-06: 0.5 mL via INTRAMUSCULAR
  Filled 2016-10-04: qty 0.5

## 2016-10-04 MED ORDER — OXYTOCIN-SODIUM CHLORIDE 30-0.9 UT/500ML-% IV SOLN
7.5000 [IU]/h | INTRAVENOUS | Status: DC
Start: 2016-10-04 — End: 2016-10-06

## 2016-10-04 MED ORDER — ONDANSETRON HCL 4 MG/2ML IJ SOLN
4.0000 mg | Freq: Three times a day (TID) | INTRAMUSCULAR | Status: DC | PRN
Start: 2016-10-04 — End: 2016-10-04

## 2016-10-04 MED ORDER — IBUPROFEN 600 MG PO TABS
600.0000 mg | ORAL_TABLET | Freq: Once | ORAL | Status: AC | PRN
Start: 2016-10-04 — End: 2016-10-04
  Administered 2016-10-04: 600 mg via ORAL
  Filled 2016-10-04: qty 1

## 2016-10-04 MED ORDER — LACTATED RINGERS IV SOLN
INTRAVENOUS | Status: DC
Start: 2016-10-04 — End: 2016-10-04

## 2016-10-04 MED ORDER — NALOXONE HCL 0.4 MG/ML IJ SOLN (WRAP)
0.1000 mg | INTRAMUSCULAR | Status: DC | PRN
Start: 2016-10-04 — End: 2016-10-04

## 2016-10-04 MED ORDER — TERBUTALINE SULFATE 1 MG/ML IJ SOLN
0.2500 mg | Freq: Once | INTRAMUSCULAR | Status: DC | PRN
Start: 2016-10-04 — End: 2016-10-04

## 2016-10-04 MED ORDER — OXYTOCIN-SODIUM CHLORIDE 30-0.9 UT/500ML-% IV SOLN
2.0000 m[IU]/min | INTRAVENOUS | Status: DC
Start: 2016-10-04 — End: 2016-10-04
  Administered 2016-10-04: 2 m[IU]/min via INTRAVENOUS
  Filled 2016-10-04: qty 1000

## 2016-10-04 MED ORDER — FENTANYL-BUPIVACAINE-NACL 0.2-0.125-0.9 MG/100ML-% EP SOLN
EPIDURAL | Status: DC
Start: 2016-10-04 — End: 2016-10-06
  Administered 2016-10-04: 11 mL/h via EPIDURAL
  Filled 2016-10-04: qty 100

## 2016-10-04 MED ORDER — ONDANSETRON 4 MG PO TBDP
4.0000 mg | ORAL_TABLET | Freq: Three times a day (TID) | ORAL | Status: DC | PRN
Start: 2016-10-04 — End: 2016-10-06

## 2016-10-04 MED ORDER — BUPIVACAINE HCL (PF) 0.25 % IJ SOLN
INTRAMUSCULAR | Status: DC | PRN
Start: 2016-10-04 — End: 2016-10-04
  Administered 2016-10-04 (×2): 5 mL via EPIDURAL

## 2016-10-04 MED ORDER — FENTANYL-BUPIVACAINE-NACL 0.2-0.125-0.9 MG/100ML-% EP SOLN
EPIDURAL | Status: AC
Start: 2016-10-04 — End: 2016-10-04
  Filled 2016-10-04: qty 100

## 2016-10-04 MED ORDER — SOD CITRATE-CITRIC ACID 500-334 MG/5ML PO SOLN
30.0000 mL | Freq: Once | ORAL | Status: DC | PRN
Start: 2016-10-04 — End: 2016-10-04

## 2016-10-04 MED ORDER — ONDANSETRON 4 MG PO TBDP
4.0000 mg | ORAL_TABLET | Freq: Three times a day (TID) | ORAL | Status: DC | PRN
Start: 2016-10-04 — End: 2016-10-04

## 2016-10-04 MED ORDER — WITCH HAZEL-GLYCERIN EX PADS
1.0000 | MEDICATED_PAD | CUTANEOUS | Status: DC | PRN
Start: 2016-10-04 — End: 2016-10-06
  Administered 2016-10-04: 1 via TOPICAL
  Filled 2016-10-04: qty 40

## 2016-10-04 MED ORDER — PROMETHAZINE HCL 25 MG/ML IJ SOLN
6.2500 mg | Freq: Four times a day (QID) | INTRAMUSCULAR | Status: DC | PRN
Start: 2016-10-04 — End: 2016-10-06

## 2016-10-04 MED ORDER — LANOLIN EX OINT
TOPICAL_OINTMENT | CUTANEOUS | Status: DC | PRN
Start: 2016-10-04 — End: 2016-10-06

## 2016-10-04 MED ORDER — ONDANSETRON HCL 4 MG/2ML IJ SOLN
4.0000 mg | Freq: Three times a day (TID) | INTRAMUSCULAR | Status: DC | PRN
Start: 2016-10-04 — End: 2016-10-04
  Administered 2016-10-04: 4 mg via INTRAVENOUS
  Filled 2016-10-04: qty 2

## 2016-10-04 MED ORDER — NALBUPHINE HCL 10 MG/ML IJ SOLN
10.0000 mg | INTRAMUSCULAR | Status: DC | PRN
Start: 2016-10-04 — End: 2016-10-06

## 2016-10-04 MED ORDER — PRENATAL AD PO TABS
1.0000 | ORAL_TABLET | Freq: Every day | ORAL | Status: DC
Start: 2016-10-05 — End: 2016-10-06
  Administered 2016-10-06: 1 via ORAL
  Filled 2016-10-04 (×2): qty 1

## 2016-10-04 MED ORDER — ACETAMINOPHEN 650 MG RE SUPP
650.0000 mg | RECTAL | Status: DC | PRN
Start: 2016-10-04 — End: 2016-10-04

## 2016-10-04 MED ORDER — OXYCODONE-ACETAMINOPHEN 5-325 MG PO TABS
1.0000 | ORAL_TABLET | Freq: Once | ORAL | Status: DC | PRN
Start: 2016-10-04 — End: 2016-10-06

## 2016-10-04 MED ORDER — BENZOCAINE 20% +/- MENTHOL 0.5% EX AERO (WRAP)
1.0000 | INHALATION_SPRAY | CUTANEOUS | Status: DC | PRN
Start: 2016-10-04 — End: 2016-10-06
  Administered 2016-10-04: 1 via TOPICAL
  Filled 2016-10-04: qty 56

## 2016-10-04 MED ORDER — PROMETHAZINE HCL 25 MG RE SUPP
12.5000 mg | Freq: Four times a day (QID) | RECTAL | Status: DC | PRN
Start: 2016-10-04 — End: 2016-10-04

## 2016-10-04 NOTE — Progress Notes (Signed)
Assume care of pt. Iv started, FHR 125 +accels no decels, CAT 1 tracing, denies pain, denies contractions, here at 38+[redacted] wks gestation for induction for umbilical hernia. 2 prior SVDs without complications. Fob at bedside and support system for today, all questions answered.

## 2016-10-04 NOTE — Plan of Care (Signed)
Problem: Vaginal/Cesarean Delivery  Goal: Maternal Status within defined parameters  Outcome: Progressing    Goal: Evidence of Fetal Well Being  Outcome: Progressing    Goal: Free from Maternal/Fetal Infection  Outcome: Progressing    Goal: Intrapartum management of pain/discomfort  Outcome: Progressing

## 2016-10-04 NOTE — Plan of Care (Signed)
Problem: Vaginal/Cesarean Delivery  Goal: Postpartum management of pain/discomfort  Outcome: Progressing   10/04/16 1840   Goal/Interventions addressed this shift   Postpartum management of pain/discomfort Assess pain using a consistent, developmental/age appropriate pain scale;Assess pain level before and following intervention;Include patient/patient care companion in decisions related to pain management;Monitor for post anesthesia issues related to pain management;Offer non-pharmacologic pain management interventions     Goal: Breasts are soft with nipple integrity intact  Outcome: Progressing   10/04/16 1840   Goal/Interventions addressed this shift   Breasts are soft with nipple integrity intact Perform breast/nipple assessment;Assess and manage engorgement;Breastfeed and/or pump breasts at least 8-12 times within 24 hours;Ensure proper positioning and latch     Goal: Gastrointestinal/Urinary management  Outcome: Progressing   10/04/16 1840   Goal/Interventions addressed this shift   Gastrointestinal/Urinary management Monitor intake and output per orders;Adequate bladder emptying per phase of care     Goal: Uterine management  Outcome: Progressing   10/04/16 1840   Goal/Interventions addressed this shift   Uterine management Assess fundus and notify LIP if not firm, midline, or at or below the umbilicus, or if abdomen is abnormally distended;Assess for hemorrhage risk using appropriate screening tool     Goal: Incision will be clean, dry, and intact and without discharge or hematoma  Outcome: Completed Date Met: 10/04/16    Goal: VTE Prevention  Outcome: Progressing   10/04/16 1840   Goal/Interventions addressed this shift   VTE prevention Administer anticoagulant(s) and/or apply anti-embolism stockings/devices as ordered;Patient ambulating with assistance;Assess skin color, turgor, peripheral pulses, perfusion, and presence of edema, e.g. capillary refill     Goal: Evidence of positive mother-baby  interactions  Outcome: Progressing   10/04/16 1840   Goal/Interventions addressed this shift   Evidence of positive mother-baby interactions Include patient/patient care companion in decisions related to care;Initiate skin to skin;Initiate safety and falls prevention interventions;Assess emotional status and coping mechanisms;Encourage rooming in and infant feeding on demand;Ensure parent/caregiver provides infant care;Assess parent/caregiver engagement and awareness of infant cues/behavior

## 2016-10-04 NOTE — Progress Notes (Signed)
AROM clear and IUPC placed.

## 2016-10-04 NOTE — Anesthesia Preprocedure Evaluation (Addendum)
Anesthesia Evaluation    AIRWAY    Mallampati: I    TM distance: >3 FB  Neck ROM: full  Mouth Opening:full   CARDIOVASCULAR    regular and normal       DENTAL         PULMONARY    clear to auscultation     OTHER FINDINGS              Relevant Problems   No relevant active problems               Anesthesia Plan    ASA 2     epidural               (Discussed health history and anesthesia plan. )      Detailed anesthesia plan: epidural        Post op pain management: epidural    informed consent obtained      pertinent labs reviewed             Signed by: Renaldo Fiddler 10/04/16 11:40 AM

## 2016-10-04 NOTE — H&P (Signed)
Labor and Delivery Admit Note    Subjective:   Catherine Spence is a 26 y.o. [id number] female at [redacted]w[redacted]d, Estimated Date of Delivery: 10/17/16.  She is being admitted for induction of labor.  Significant factors related to the current pregnancy include:  Umbilical hernia which the MFM siad should deliver her at 38 weeks to avoid stangulation . Marland Kitchen  Patient reports no complaints.  She denies vaginal bleeding.  Fetal Movement: normal.    Review of Systems:  All other systems were reviewed and are negative except as previously noted in the HPI.    Objective:    Vital signs in last 24 hours:  Temp:  [98.7 F (37.1 C)] 98.7 F (37.1 C)  Heart Rate:  [77-100] 77  BP: (97-123)/(51-68) 123/67     FHTS:  140   Toco:   none  General:   alert, appears stated age and cooperative   Lungs:   clear to auscultation bilaterally   Heart:   regular rate and rhythm   Abdomen:   gravid, non-tender   SVE:   3/50/-1     Lab Review:  Lab Results   Component Value Date    ABORH O POS 10/04/2016    RUBELLAABIGG Immune 04/28/2012    HEPBSAG Non-Reactive 10/04/2016    HEPBSAG Negative 08/25/2008    RPR Nonreactive 04/28/2012    RPR Non Reactive 08/25/2008    GBS Negative 09/13/2016        Assessment:   26 y.o. [id number] female at [redacted]w[redacted]d   Not in labor.    Problem List:  Patient Active Problem List   Diagnosis   . Abdominal pain complicating pregnancy   . Umbilical hernia   . Pregnancy   . [redacted] weeks gestation of pregnancy   . SOB (shortness of breath)   . Post term pregnancy   . Abdominal pain   . Pain   . [redacted] weeks gestation of pregnancy       Plan:   Admit to L&D   Anticipate normal spontaneous vaginal delivery     Risks, benefits, alternatives and possible complications have been discussed in detail with the patient.  All questions answered and appropriate consents will be completed at the hospital.      Historical Data:  PAST MEDICAL HISTORY  Past Medical History:   Diagnosis Date   . Anxiety    . Bacterial vaginitis    . Childhood asthma     . Depression    . Headache    . Hernia, umbilical    . HPV in female 2013    had cervical biopsies   . Migraine    . Other and unspecified ovarian cysts     left side   . Ovarian cyst    . Pollen allergies    . Postpartum depression     no meds     PAST SURGICAL HISTORY  Past Surgical History:   Procedure Laterality Date   . tubes in ears       OB History   Gravida Para Term Preterm AB Living   4 2 2   1 2    SAB TAB Ectopic Multiple Live Births   1       2      # Outcome Date GA Lbr Len/2nd Weight Sex Delivery Anes PTL Lv   4 Current            3 Term 12/17/12 [redacted]w[redacted]d  3.295 kg (7 lb 4.2 oz) F  Vag-Spont EPI N LIV   2 Term 04/08/11 [redacted]w[redacted]d  2.892 kg (6 lb 6 oz) M Vag-Spont   LIV      Birth Comments: UTI in pregnancy   1 SAB 2011 [redacted]w[redacted]d             Birth Comments: vacuum done in ED        MEDS  No current facility-administered medications on file prior to encounter.      Current Outpatient Prescriptions on File Prior to Encounter   Medication Sig Dispense Refill   . Prenatal MV-Min-Fe Fum-FA-DHA (PRENATAL 1 PO) Take 1 tablet by mouth.       ALLERGIES  Allergies   Allergen Reactions   . Amoxicillin Anaphylaxis   . Ceclor [Cefaclor] Anaphylaxis and Rash   . Macrodantin Anaphylaxis   . Penicillins Anaphylaxis   . Chocolate Nausea And Vomiting   . Flagyl [Metronidazole] Itching   . Percocet [Oxycodone-Acetaminophen] Nausea And Vomiting   . Sulfa Antibiotics Itching   . Vicodin [Hydrocodone-Acetaminophen] Hives     Passed out     . Clindamycin Rash     FAMILY HISTORY  Family History   Problem Relation Age of Onset   . Drug abuse Father    . Drug abuse Maternal Aunt    . Alcohol abuse Maternal Uncle    . Depression Maternal Uncle    . Drug abuse Maternal Grandfather      SOCIAL HISTORY  Social History     Occupational History   . Not on file.     Social History Main Topics   . Smoking status: Current Every Day Smoker     Packs/day: 0.25     Years: 15.00     Types: Cigarettes   . Smokeless tobacco: Never Used      Comment:  trying to quit, has been smoking since age 39 up to 1 ppd   . Alcohol use No   . Drug use: No      Comment: Hx of multiple drug abuse including cocaine, marijuana   . Sexual activity: Yes     Partners: Male

## 2016-10-04 NOTE — Progress Notes (Signed)
MOM     26 y.o.  [id number]  [redacted]w[redacted]d weeks  Allergies   Allergen Reactions   . Amoxicillin Anaphylaxis   . Ceclor [Cefaclor] Anaphylaxis and Rash   . Macrodantin Anaphylaxis   . Penicillins Anaphylaxis   . Chocolate Nausea And Vomiting   . Flagyl [Metronidazole] Itching   . Percocet [Oxycodone-Acetaminophen] Nausea And Vomiting   . Sulfa Antibiotics Itching   . Vicodin [Hydrocodone-Acetaminophen] Hives     Passed out     . Clindamycin Rash     Her current pregnancy is significant for   Patient Active Problem List   Diagnosis   . Abdominal pain complicating pregnancy   . Umbilical hernia   . Pregnancy   . [redacted] weeks gestation of pregnancy   . SOB (shortness of breath)   . Post term pregnancy   . Abdominal pain   . Pain   . [redacted] weeks gestation of pregnancy     Prior Delivery: spontaneous vaginal    Past Medical History:   Diagnosis Date   . Anxiety    . Bacterial vaginitis    . Childhood asthma    . Depression    . Headache    . Hernia, umbilical    . HPV in female 2013    had cervical biopsies   . Migraine    . Other and unspecified ovarian cysts     left side   . Ovarian cyst    . Pollen allergies    . Postpartum depression     no meds       Past Surgical History:   Procedure Laterality Date   . tubes in ears         Pertinent prenatal Labs -   Lab Results   Component Value Date    ABORH O POS 10/04/2016    HEPBSAG Non-Reactive 10/04/2016    HEPBSAG Negative 08/25/2008    GBS Negative 09/13/2016    RUBELLAABIGG Immune 04/28/2012    RPR Nonreactive 04/28/2012    RPR Non Reactive 08/25/2008       Interpreter Waiver Signed: N/A    Delivering Clinician:       Delivery Type:     Vaginal, Spontaneous Delivery   Delivery Date and Time: 10/04/2016  4:01 PM     Delivery Complications: none    Rupture Date - 10/04/2016   Rupture time - 1:45 PM   Fluid color - Clear     Laceration (Yes/None):     None   Laceration Type:         Episiotomy:     None      Anesthesia:           ADDITIONAL COMMENTS:    Prenatal Scanned into Epic:  Yes    Ibuprofen given prior to transfer to [facility] (for vaginal deliveries only)- Yes  Other Medications given in L&D- n/a      Recent Output- 150 straight cath @ 1730     Fundus- Firm @ U  Lochia- scant      EBL: 300 ,0     Pain Score: 0    Prolonged 2nd stage of labor  No       Precipitous labor < 3 hours  No       Prolong Oxytocin use  No         Patient up with SARASTEADY -  No            BABY    Security Cube-  Baby Safety Contract Signed No         Vaginal, Spontaneous Delivery  of live female  infant 6 lb 14.1 oz (3120 g)      1 Minute Apgar:     8      5 Minute Apgar:     9     Nuchal Cord:       Fenton Growth Scale Percentile: 53   >90% or <10% No  ; Hypoglycemia Protocol Initiated: Yes    ADDITIONAL COMMENTS:  Feeding:     Skin-to-skin initiated No                 Labs due- tox screen  Cord blood- Lab/Blood Bank   Eyes & Thighs: done    Void -   no  Stool -   no  Circumcision N/A    Pediatrician Notification:  If not house pediatrician, was private Pediatrician Notified: Yes, peds notification form filled out Yes    At home Pediatrician: Dr Welton Flakes [phone]  Hospital Pediatrician: Derald Macleod      Bedside report given to RN using ISHAPED report.  Safety checks done. Pt care relinquished.    [name]  10/04/2016 6:35 PM

## 2016-10-05 LAB — RPR (REFLEX TO TITER AND CONFIRMATION): RPR: NONREACTIVE

## 2016-10-05 MED ORDER — ERYTHROMYCIN BASE 250 MG PO TABS
250.0000 mg | ORAL_TABLET | Freq: Four times a day (QID) | ORAL | Status: DC
Start: 2016-10-05 — End: 2016-10-06
  Administered 2016-10-05 – 2016-10-06 (×5): 250 mg via ORAL
  Filled 2016-10-05 (×8): qty 1

## 2016-10-05 MED ORDER — SENNOSIDES-DOCUSATE SODIUM 8.6-50 MG PO TABS
1.0000 | ORAL_TABLET | Freq: Every day | ORAL | Status: DC | PRN
Start: 2016-10-05 — End: 2016-10-06
  Administered 2016-10-05: 1 via ORAL
  Filled 2016-10-05: qty 1

## 2016-10-05 MED ORDER — CEPHALEXIN 500 MG PO CAPS
500.0000 mg | ORAL_CAPSULE | Freq: Four times a day (QID) | ORAL | Status: DC
Start: 2016-10-05 — End: 2016-10-05

## 2016-10-05 MED ORDER — ERYTHROMYCIN BASE 250 MG PO TABS
250.0000 mg | ORAL_TABLET | Freq: Three times a day (TID) | ORAL | 0 refills | Status: AC
Start: 2016-10-05 — End: 2016-10-11

## 2016-10-05 NOTE — Plan of Care (Signed)
Problem: Vaginal/Cesarean Delivery  Goal: Postpartum management of pain/discomfort  Outcome: Progressing   10/05/16 1240   Goal/Interventions addressed this shift   Postpartum management of pain/discomfort Assess pain using a consistent, developmental/age appropriate pain scale;Assess pain level before and following intervention     Goal: Breasts are soft with nipple integrity intact  Outcome: Progressing   10/05/16 1240   Goal/Interventions addressed this shift   Breasts are soft with nipple integrity intact Perform breast/nipple assessment;Assess and manage engorgement;Breastfeed and/or pump breasts at least 8-12 times within 24 hours;Ensure proper positioning and latch     Goal: Gastrointestinal/Urinary management  Outcome: Progressing   10/05/16 1240   Goal/Interventions addressed this shift   Gastrointestinal/Urinary management Adequate bladder emptying per phase of care     Goal: Uterine management  Outcome: Progressing   10/05/16 1240   Goal/Interventions addressed this shift   Uterine management Assess fundus and notify LIP if not firm, midline, or at or below the umbilicus, or if abdomen is abnormally distended     Goal: VTE Prevention  Outcome: Progressing   10/05/16 1240   Goal/Interventions addressed this shift   VTE prevention Patient ambulating independently     Goal: Evidence of positive mother-baby interactions  Outcome: Progressing   10/05/16 1240   Goal/Interventions addressed this shift   Evidence of positive mother-baby interactions Include patient/patient care companion in decisions related to care;Initiate skin to skin;Initiate safety and falls prevention interventions

## 2016-10-05 NOTE — Plan of Care (Signed)
Problem: Vaginal/Cesarean Delivery  Goal: Postpartum management of pain/discomfort  Outcome: Progressing   10/05/16 0315   Goal/Interventions addressed this shift   Postpartum management of pain/discomfort Assess pain using a consistent, developmental/age appropriate pain scale;Assess pain level before and following intervention;Include patient/patient care companion in decisions related to pain management;Monitor for post anesthesia issues related to pain management;Offer non-pharmacologic pain management interventions     Goal: Breasts are soft with nipple integrity intact  Outcome: Progressing   10/05/16 0315   Goal/Interventions addressed this shift   Breasts are soft with nipple integrity intact Perform breast/nipple assessment   Pt is formula feeding infant.  Goal: Gastrointestinal/Urinary management  Outcome: Progressing   10/05/16 0315   Goal/Interventions addressed this shift   Gastrointestinal/Urinary management Adequate bladder emptying per phase of care     Goal: Uterine management  Outcome: Progressing   10/05/16 0315   Goal/Interventions addressed this shift   Uterine management Assess fundus and notify LIP if not firm, midline, or at or below the umbilicus, or if abdomen is abnormally distended;Assess for hemorrhage risk using appropriate screening tool     Goal: VTE Prevention  Outcome: Progressing   10/05/16 0315   Goal/Interventions addressed this shift   VTE prevention Patient ambulating independently;Assess skin color, turgor, peripheral pulses, perfusion, and presence of edema, e.g. capillary refill     Goal: Evidence of positive mother-baby interactions  Outcome: Progressing   10/05/16 0315   Goal/Interventions addressed this shift   Evidence of positive mother-baby interactions Include patient/patient care companion in decisions related to care;Initiate safety and falls prevention interventions;Assess emotional status and coping mechanisms;Encourage rooming in and infant feeding on  demand;Ensure parent/caregiver provides infant care;Assess parent/caregiver engagement and awareness of infant cues/behavior;Initiate skin to skin

## 2016-10-05 NOTE — Progress Notes (Signed)
Patient reports doing well, comfortable overnight, denies any severe headache, problems moving/feeling lower extremities, or severe back pain.

## 2016-10-05 NOTE — Progress Notes (Signed)
Postpartum Note    Subjective:  Delivery Type: Vaginal   No complaints.  Minimal Bleeding.  Breastfeeding well.    Objective:  Vital Signs:  Temp:  [97.4 F (36.3 C)-98.6 F (37 C)] 98 F (36.7 C)  Heart Rate:  [65-102] 65  Resp Rate:  [17-18] 18  BP: (94-123)/(53-73) 116/60  Breast: soft, non tender   Fundus: firm, non tender  Lochia: mod, rubra.    Recent Labs:    Recent Labs  Lab 10/04/16  0659   WBC 13.59*   Hgb 10.2*   Hematocrit 31.4*   Platelets 263       Assessment/Plan:  Stable - Postpartum Day 1  Routine care, discharge  tomorrow, F/U in 6 weeks.  Prescriptions given: PNV,  none.

## 2016-10-06 LAB — RUBELLA ANTIBODY, IGG: Rubella AB, IgG: 1.31

## 2016-10-06 NOTE — Discharge Summary -  Nursing (Signed)
Hearing Screen:          Congenital Heart Screen:              Biomedical scientist Challenge:              (<[redacted] wk GA or <2500g) No     Consults ordered during nursery stay:  IP CONSULT TO LACTATION  POC bili:          Newborn Metabolic Screen: Yes   No results found for: METABOLICSCR        Hep B- Yes    Vit K and Erythromycin oint.-Yes      Time orders received.   Miguel Barrera instructions reviewed.  Questions answered.  Bands verified.  Sensor removed from baby and out of security system.  Mom and baby [place] home on 7/5 @ 11:30

## 2016-10-06 NOTE — Discharge Summary -  Nursing (Signed)
Discharge order entered.  Vaccine screening complete and vaccines given.    [place] instructions given.  Questions answered.  Mom 'd on 7/5 @ 11:30

## 2016-10-06 NOTE — Discharge Instr - AVS First Page (Signed)
These discharge  instructions come from [facility] care booklet you received upon admission.  Please refer to the specific pages for more information.        Please schedule your follow- up appointment as directed by your doctor.    Basic Care  Your body will take 6-8 weeks to heal from childbirth. You may feel cramps or "afterbirth" pains during the first few days following delivery, this is normal and means the uterus is decreasing to its pre-pregnancy size. (See Page 2)    Maintaining good hygiene helps prevent infection. (See Page 3)    Vaginal bleeding can last from 4-6 weeks. Bleeding may increase with activity. Do not put anything into your vagina including tampons. (See Page 3)    Call your doctor immediately if you are saturating 2 or more pads an hour, gushing blood or passing clots larger than a golf ball size. (See Page 3)    It is important to keep your perineum area clean to prevent infection, promote healing and increase comfort. (See Page 3)    Incision Care after Cesarean Section (See Page 3)    It is important to empty your bladder every 2-4 hours for the first few days postpartum. (See Page 4)    Your first bowel movement should occur 2-3 days following delivery. (See Page 4)    For signs of illness and when to call your doctor immediately: (page 4)    Foul-smelling vaginal bleeding or discharge    Large clots (golf-ball size or larger)    Vaginal bleeding that saturates two or more pads in one hour    A fever of 100.4 degrees F or higher     Redness, severe pain or a lump in either breast     Flu-like symptoms     Tenderness or pain in your lower legs, especially when walking     Overwhelming feelings of sadness, anger or inability to cope    Pain or burning when urinating     Separation or oozing of any sutured area (cesarean or tubal incision, episiotomy or tear)     Other symptoms you do not understand      Rest and Activity  Rest is essential to a smooth recovery following  delivery. (See Page 4)    Exercise can help promote a healthy recovery, See Page 4 for a non-stressful exercise routine to follow.    It is not uncommon to experience a temporary depression after the birth of your baby, for information on Postpartum Blues see Page 5.    Nutrition  Continue your prenatal vitamins while you are breastfeeding or formula feeding. (See Page 5)    You can find additional nutrition tips on Page 5.    Sexuality  Talk to your healthcare provider about when it is okay to resume sexual activity. (See page 6)    Postpartum Blues    It is not uncommon to experience a temporary depression after the birth of your baby. Getting plenty of rest, eating a well-balanced diet, and sharing your feelings with your partner can help.     Here are some signals:         Inability to perform daily activities, such as showering or getting dressed         Crying spells         Eating very little         Insomnia or excessive sleep           Inability to respond to your baby's basic needs       Family Planning and Contraception  It is important to use a method of family planning if you do not want to become pregnant. For Methods of Contraception see Page 7.    If you are not breastfeeding menstruation usually occurs 7-9 weeks after delivery. If you are nursing exclusively, you may not have a period for several weeks or months. (See page 7)    Information on breastfeeding including how to avoid and deal with engorgement and sore/cracked nipples can be found on Page 8-13.      Information on infant care and safety can be found on Page 14-21.

## 2016-10-06 NOTE — Plan of Care (Signed)
Problem: Vaginal/Cesarean Delivery  Goal: Postpartum management of pain/discomfort  Outcome: Progressing   10/06/16 0322   Goal/Interventions addressed this shift   Postpartum management of pain/discomfort Assess pain using a consistent, developmental/age appropriate pain scale;Assess pain level before and following intervention;Include patient/patient care companion in decisions related to pain management;Monitor for post anesthesia issues related to pain management;Offer non-pharmacologic pain management interventions   Patient complained of "uterine pain."  Stated she had to void.  Medicated with Ibuprofen.  Patient expressed some pain relief upon voiding and went to sleep without further complaint.  Goal: Breasts are soft with nipple integrity intact  Outcome: Progressing   10/06/16 0322   Goal/Interventions addressed this shift   Breasts are soft with nipple integrity intact Perform breast/nipple assessment;Assess and manage engorgement   Patient bottle feeding by choice.  Instructed to wear a supportive bra as soon as possible.  Goal: Gastrointestinal/Urinary management  Outcome: Progressing   10/06/16 0322   Goal/Interventions addressed this shift   Gastrointestinal/Urinary management Monitor intake and output per orders;Adequate bladder emptying per phase of care     Goal: Uterine management  Outcome: Progressing   10/06/16 0322   Goal/Interventions addressed this shift   Uterine management Assess fundus and notify LIP if not firm, midline, or at or below the umbilicus, or if abdomen is abnormally distended;Assess for hemorrhage risk using appropriate screening tool     Goal: VTE Prevention  Outcome: Progressing   10/06/16 0322   Goal/Interventions addressed this shift   VTE prevention Patient ambulating independently;Assess skin color, turgor, peripheral pulses, perfusion, and presence of edema, e.g. capillary refill   Patient has sore, reddened, swollen area (pustule-looking area) on left, upper, inner  thigh. States she has had these in the past.  Is currently on P.O. antibiotic for same.  Goal: Evidence of positive mother-baby interactions  Outcome: Progressing   10/06/16 0322   Goal/Interventions addressed this shift   Evidence of positive mother-baby interactions Include patient/patient care companion in decisions related to care;Initiate safety and falls prevention interventions;Assess emotional status and coping mechanisms;Encourage rooming in and infant feeding on demand;Ensure parent/caregiver provides infant care;Assess parent/caregiver engagement and awareness of infant cues/behavior   Patient practicing rooming in, but then complained that infant was fussy and requested she be sent to nursery so that patient could sleep.

## 2016-10-07 NOTE — UM Notes (Signed)
PATIENT NAME: Catherine Spence, Catherine Spence   PATIENT DOB: 1991-01-01, 26 y.o./26 y.o., female   DISCHARGE DATE NOTIFICATION   PATIENT DISCHARGED ON: 10/06/2016 TO Home or Self Care         Wynell Balloon RN CM  [facility]  [phone]    Clinical Case Management - Utilization Review  [facility]   [address]   [place], [place] [address]  [phone]  [phone] (Fax)    Please use fax number [phone] to provide authorization for hospital services or to request additional information.    This clinical/utilization review is compiled from the documentation of the care team providers.

## 2016-10-14 NOTE — Addendum Note (Signed)
Addendum  created 10/14/16 1834 by Renaldo Fiddler, MD    Anesthesia Intra Blocks edited, Child order released for a procedure order, LDA created via procedure documentation, Sign clinical note

## 2016-10-14 NOTE — Anesthesia Procedure Notes (Signed)
Epidural    Patient location during procedure: L&D  Reason for block: Labor or C-section  Block at Surgeon's request: Yes    Start time: 10/04/2016 11:58 AM  End time: 10/04/2016 12:20 PM    Staffing  Anesthesiologist: Kathryne Hitch C  Performed: anesthesiologist     Pre-procedure Checklist   Completed: patient identified, surgical consent, pre-op evaluation, timeout performed, risks and benefits discussed, monitors and equipment checked, anesthesia consent given and correct site  Timeout Completed:  10/04/2016 11:58 AM    Epidural  Patient monitoring: NIBP    Premedication: No  Patient position: sitting    Skin Local: lidocaine 2%  Dose: 4 mL    Attempts  Number of attempts: 1                      Approach: midline    Needle type: Touhy needle   Needle gauge: 17  Injection technique: LOR saline  Epidural Space ID: 6 cm  CSF Return: No   Blood Return: No  Paresthesia Pain: no    Needle Placement  Needle type: Touhy needle   Needle gauge: 17  Injection technique: LOR saline  CSF Return: No  Blood Return: No          Paresthesia Pain: no    Catheter Placement   Catheter type: side hole  Catheter size: 18 G  Catheter at skin depth: 10 cm  CSF Return: No  Blood Return: No  Test Dose:other and negative  3 cc  Incremental injection: yes  Injection made incrementally with aspirations every 1 mL.    No Catheter IV/SA Signs or Symptoms    Assessment   Sensory level: T6  Block Outcome: patient tolerated procedure well, no complications, successful block and pain improved  Additional Notes  Sterile procedure, easy placement.  Pt is doing better.

## 2016-10-17 NOTE — Discharge Summary (Signed)
.      [facility]  Obstetrical Discharge Form      Admission Diagnosis: Intrauterine Pregnancy at [redacted]w[redacted]d    Antepartum complications: none    Date of Delivery: 10/04/2016    Time of Delivery: 4:01 PM      Delivered By: Archie Endo L     Delivery Type: Vaginal, Spontaneous Delivery   Tubal Ligation: n/a    Anesthesia:     Baby: Liveborn Female ; Birth weight: 6 lb 14.1 oz (3120 g) ; Apgar 1 minute: 8  Apgar 5 minute: 9 ;                                                       Feeding Type: Formula     Delivery complications: none    Episiotomy: None   Laceration Type:        Prenatal Labs:   Lab Results   Component Value Date    ABORH O POS 10/04/2016    RUBELLAABIGG 1.31 10/04/2016    HEPBSAG Non-Reactive 10/04/2016    HEPBSAG Negative 08/25/2008    GBS Negative 09/13/2016       Hospital Course : Catherine Spence is a 26 y.o. with an IUP at [redacted]w[redacted]d. She was admitted to the hospital for induction of labor. Her antepartum course was complicated by umbicial herniailical hernia.  Patient delivered via spontaneous vaginal delivery. Her postpartum course was umbilical hernia.Marland Kitchen She was discharged home in a stable good condition.    Discharge Date: 10/17/16     Plan:     [place] Instructions:   The patient has been given a discharge instruction sheet which includes precautions for activity level, dietary precautions, expectations, potential complications that require contact with the office and discharge prescriptions. These instructions were reviewed with the patient. All questions were answered.    Follow-up appointment in 6 weeks if vaginal delivery and 2 weeks if c/section.

## 2016-11-26 ENCOUNTER — Ambulatory Visit: Payer: Medicaid Other | Admitting: Obstetrics and Gynecology

## 2016-11-26 DIAGNOSIS — Z01419 Encounter for gynecological examination (general) (routine) without abnormal findings: Secondary | ICD-10-CM

## 2016-11-29 NOTE — Progress Notes (Signed)
Subjective:       Catherine Spence is a 26 y.o. female here for a routine exam.  Current complaints: had an abnormal pap.  Personal health questionnaire reviewed: yes.     Gynecologic History  Patient's last menstrual period was 01/18/2016.  Contraception: Depo-Provera injections  Breast self exam: discussed         The following portions of the patient's history were reviewed and updated as appropriate: allergies, current medications, past family history, past medical history, past social history, past surgical history and problem list.      Review of Systems  Pertinent items are noted in HPI.      Objective:      LMP 01/18/2016   General appearance: alert, appears stated age and cooperative  Neck: no adenopathy, supple, symmetrical, trachea midline and thyroid not enlarged, symmetric, no tenderness/mass/nodules  Breasts: normal appearance, no masses or tenderness, No nipple retraction or dimpling, No nipple discharge or bleeding, No axillary or supraclavicular adenopathy  Abdomen: soft, non-tender; bowel sounds normal; no masses,  no organomegaly  Pelvic exam:     Urinary system: urethral meatus normal   External genitalia: normal general appearance   Vaginal: normal rugae   Cervix: normal appearance   Adnexa: normal bimanual exam   Uterus: normal single, nontender   Rectal: good sphincter tone and no masses          Assessment:      Healthy female exam.        Plan:      Education reviewed: calcium supplements and depression evaluation.  Thin prep pap/HPV Yes

## 2016-12-02 LAB — PAP IG, CT-NG, RFX HPV ASCU
.: 0
Chlamydia, Nuc. Acid Amp: NEGATIVE
Gonococcus by Nucleic acid Amp: NEGATIVE

## 2016-12-02 LAB — HPV DNA PROBE, AMPLIFIED: HPV, high-risk: POSITIVE — AB

## 2016-12-06 ENCOUNTER — Other Ambulatory Visit: Payer: Self-pay

## 2016-12-06 ENCOUNTER — Other Ambulatory Visit: Payer: Self-pay | Admitting: Obstetrics and Gynecology

## 2016-12-06 DIAGNOSIS — Z202 Contact with and (suspected) exposure to infections with a predominantly sexual mode of transmission: Secondary | ICD-10-CM

## 2016-12-08 ENCOUNTER — Telehealth: Payer: Self-pay

## 2016-12-12 LAB — HSV TYPE 1 AND 2 IGG: HSV 1 IgG Type-Specific AB: 1.91 index — ABNORMAL HIGH (ref 0.00–0.90)

## 2016-12-12 LAB — HSV TYPE 1 AND 2 ANTIBODY IGG: HSV 2 IgG Type-Specific Antibody: <0.91 index (ref 0.00–0.90)

## 2016-12-20 ENCOUNTER — Ambulatory Visit: Payer: Medicaid Other | Admitting: Obstetrics and Gynecology

## 2016-12-20 ENCOUNTER — Ambulatory Visit: Payer: Medicaid Other

## 2016-12-21 ENCOUNTER — Ambulatory Visit: Payer: Medicaid Other | Admitting: Obstetrics and Gynecology

## 2016-12-26 ENCOUNTER — Encounter: Payer: Self-pay | Admitting: Adult Health

## 2016-12-27 ENCOUNTER — Ambulatory Visit
Admission: RE | Admit: 2016-12-27 | Payer: Medicaid Other | Source: Ambulatory Visit | Admitting: Obstetrics & Gynecology

## 2016-12-27 ENCOUNTER — Encounter: Admission: RE | Payer: Self-pay | Source: Ambulatory Visit

## 2016-12-27 HISTORY — DX: Gastro-esophageal reflux disease without esophagitis: K21.9

## 2016-12-27 SURGERY — CONIZATION, CERVICAL, CONE BIOPSY
Anesthesia: Choice | Site: Pelvis

## 2017-01-02 ENCOUNTER — Ambulatory Visit: Payer: Medicaid Other | Admitting: Obstetrics and Gynecology

## 2017-01-03 NOTE — Progress Notes (Signed)
Pt comes in with pelvic pain and had a vaginal delivery about 2 months ago.  She also has severe dysplasia and is going to have a leep done in the next few weeks.  UA negative  Pelvi NEFG without lesions    Cervix is tender Uterus is midline and tender on palpation.   adenexa negative  A: Pelvic Pain  Rest and meds given for the pain roxicodne 10 mg.     Archie Endo

## 2017-01-06 ENCOUNTER — Other Ambulatory Visit: Payer: Self-pay

## 2017-01-06 ENCOUNTER — Ambulatory Visit: Payer: Medicaid Other | Admitting: Obstetrics and Gynecology

## 2017-01-06 NOTE — Progress Notes (Signed)
Pt needs ot have a leep loop scheduled and also is c/o of pelvic pain.  She states she used the roxicodine that I gave her last week.  She is having surgery so I will only give her some to last until the surgery.   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

## 2017-01-09 ENCOUNTER — Ambulatory Visit: Payer: Medicaid Other | Admitting: Obstetrics and Gynecology

## 2017-01-12 NOTE — Pre-Procedure Instructions (Signed)
PSS interview attempted. Pt requests to be called after 3pm today.  Dr Dorinda Hill office called for H&P, consent and any preop labs/testing > faxing.

## 2017-01-12 NOTE — Pre-Procedure Instructions (Signed)
Low risk sx  Preop labs with Surgeon. Office called earlier to fax H&P, consent and preop labs- nothing received. Surgeon will bring DOS.  NO DOS orders.  Anes to review for chronic pain.

## 2017-01-13 ENCOUNTER — Ambulatory Visit: Payer: Medicaid Other | Admitting: Pain Medicine

## 2017-01-13 ENCOUNTER — Ambulatory Visit: Payer: Self-pay

## 2017-01-13 ENCOUNTER — Other Ambulatory Visit: Payer: Self-pay

## 2017-01-13 ENCOUNTER — Ambulatory Visit
Admission: RE | Admit: 2017-01-13 | Discharge: 2017-01-13 | Disposition: A | Discharge status: Home / Self Care | Payer: Medicaid Other | Source: Ambulatory Visit | Attending: Obstetrics & Gynecology | Admitting: Obstetrics & Gynecology

## 2017-01-13 ENCOUNTER — Encounter
Admission: RE | Disposition: A | Discharge status: Home / Self Care | Payer: Self-pay | Source: Ambulatory Visit | Attending: Obstetrics & Gynecology

## 2017-01-13 DIAGNOSIS — K219 Gastro-esophageal reflux disease without esophagitis: Secondary | ICD-10-CM | POA: Insufficient documentation

## 2017-01-13 DIAGNOSIS — I10 Essential (primary) hypertension: Secondary | ICD-10-CM | POA: Insufficient documentation

## 2017-01-13 DIAGNOSIS — N879 Dysplasia of cervix uteri, unspecified: Secondary | ICD-10-CM | POA: Insufficient documentation

## 2017-01-13 HISTORY — DX: Other specified postprocedural states: Z98.890

## 2017-01-13 HISTORY — DX: Cardiac murmur, unspecified: R01.1

## 2017-01-13 HISTORY — DX: Urinary tract infection, site not specified: N39.0

## 2017-01-13 HISTORY — DX: Other chronic pain: G89.29

## 2017-01-13 HISTORY — DX: Other psychoactive substance abuse, in remission: F19.11

## 2017-01-13 HISTORY — PX: LEEP: SHX4694

## 2017-01-13 HISTORY — DX: Other complications of anesthesia, initial encounter: T88.59XA

## 2017-01-13 HISTORY — DX: Low back pain, unspecified: M54.50

## 2017-01-13 HISTORY — DX: Unspecified injury of head, initial encounter: S09.90XA

## 2017-01-13 HISTORY — DX: Nausea with vomiting, unspecified: R11.2

## 2017-01-13 HISTORY — DX: Other injury of unspecified body region, initial encounter: T14.8XXA

## 2017-01-13 HISTORY — DX: Post-traumatic stress disorder, unspecified: F43.10

## 2017-01-13 HISTORY — DX: Unspecified hearing loss, unspecified ear: H91.90

## 2017-01-13 HISTORY — DX: Essential (primary) hypertension: I10

## 2017-01-13 LAB — URINE HCG QUALITATIVE: Urine HCG Qualitative: NEGATIVE

## 2017-01-13 SURGERY — LEEP
Anesthesia: Anesthesia MAC / Sedation | Site: Pelvis | Wound class: Clean Contaminated

## 2017-01-13 MED ORDER — FAMOTIDINE 10 MG/ML IV SOLN (WRAP)
INTRAVENOUS | Status: DC | PRN
Start: 2017-01-13 — End: 2017-01-13
  Administered 2017-01-13: 20 mg via INTRAVENOUS

## 2017-01-13 MED ORDER — PROMETHAZINE HCL 25 MG/ML IJ SOLN
6.2500 mg | INTRAMUSCULAR | Status: DC | PRN
Start: 2017-01-13 — End: 2017-01-13

## 2017-01-13 MED ORDER — ONDANSETRON HCL 4 MG/2ML IJ SOLN
4.0000 mg | INTRAMUSCULAR | Status: DC | PRN
Start: 2017-01-13 — End: 2017-01-13

## 2017-01-13 MED ORDER — OXYCODONE HCL 15 MG PO TABS
15.0000 mg | ORAL_TABLET | Freq: Four times a day (QID) | ORAL | 0 refills | Status: DC | PRN
Start: 2017-01-13 — End: 2017-11-08
  Filled 2017-01-13: qty 15, 4d supply, fill #0

## 2017-01-13 MED ORDER — FENTANYL CITRATE (PF) 50 MCG/ML IJ SOLN (WRAP)
INTRAMUSCULAR | Status: AC
Start: 2017-01-13 — End: ?
  Filled 2017-01-13: qty 2

## 2017-01-13 MED ORDER — PROPOFOL 10 MG/ML IV EMUL (WRAP)
INTRAVENOUS | Status: DC | PRN
Start: 2017-01-13 — End: 2017-01-13
  Administered 2017-01-13: 120 ug/kg/min via INTRAVENOUS

## 2017-01-13 MED ORDER — MEPERIDINE HCL 25 MG/ML IJ SOLN
6.2500 mg | INTRAMUSCULAR | Status: DC | PRN
Start: 2017-01-13 — End: 2017-01-13

## 2017-01-13 MED ORDER — SODIUM CHLORIDE 0.9 % IR SOLN
Status: DC | PRN
Start: 2017-01-13 — End: 2017-01-13
  Administered 2017-01-13: 500 mL

## 2017-01-13 MED ORDER — FENTANYL CITRATE (PF) 50 MCG/ML IJ SOLN (WRAP)
25.0000 ug | INTRAMUSCULAR | Status: DC | PRN
Start: 2017-01-13 — End: 2017-01-13

## 2017-01-13 MED ORDER — OXYCODONE HCL 5 MG PO TABS
15.0000 mg | ORAL_TABLET | Freq: Four times a day (QID) | ORAL | Status: DC | PRN
Start: 2017-01-13 — End: 2017-01-13

## 2017-01-13 MED ORDER — OXYCODONE HCL 5 MG PO TABS
5.0000 mg | ORAL_TABLET | Freq: Four times a day (QID) | ORAL | Status: DC | PRN
Start: 2017-01-13 — End: 2017-01-13

## 2017-01-13 MED ORDER — MIDAZOLAM HCL 2 MG/2ML IJ SOLN
INTRAMUSCULAR | Status: DC | PRN
Start: 2017-01-13 — End: 2017-01-13
  Administered 2017-01-13: 2 mg via INTRAVENOUS

## 2017-01-13 MED ORDER — LACTATED RINGERS IV SOLN
INTRAVENOUS | Status: DC
Start: 2017-01-13 — End: 2017-01-13

## 2017-01-13 MED ORDER — LIDOCAINE HCL (PF) 1 % IJ SOLN
INTRAMUSCULAR | Status: AC
Start: 2017-01-13 — End: ?
  Filled 2017-01-13: qty 30

## 2017-01-13 MED ORDER — DEXAMETHASONE SODIUM PHOSPHATE 4 MG/ML IJ SOLN (WRAP)
INTRAMUSCULAR | Status: DC | PRN
Start: 2017-01-13 — End: 2017-01-13
  Administered 2017-01-13: 4 mg via INTRAVENOUS

## 2017-01-13 MED ORDER — MIDAZOLAM HCL 2 MG/2ML IJ SOLN
INTRAMUSCULAR | Status: AC
Start: 2017-01-13 — End: ?
  Filled 2017-01-13: qty 2

## 2017-01-13 MED ORDER — KETAMINE HCL 50 MG/ML IJ SOLN
INTRAMUSCULAR | Status: DC | PRN
Start: 2017-01-13 — End: 2017-01-13
  Administered 2017-01-13 (×2): 10 mg via INTRAVENOUS

## 2017-01-13 MED ORDER — FENTANYL CITRATE (PF) 50 MCG/ML IJ SOLN (WRAP)
INTRAMUSCULAR | Status: DC | PRN
Start: 2017-01-13 — End: 2017-01-13
  Administered 2017-01-13 (×2): 50 ug via INTRAVENOUS

## 2017-01-13 MED ORDER — KETAMINE HCL 10 MG/ML IJ/IV SOLN (WRAP)
Status: AC
Start: 2017-01-13 — End: ?
  Filled 2017-01-13: qty 2

## 2017-01-13 MED ORDER — LIDOCAINE HCL 2 % IJ SOLN
INTRAMUSCULAR | Status: DC | PRN
Start: 2017-01-13 — End: 2017-01-13
  Administered 2017-01-13: 30 mg via INTRAVENOUS

## 2017-01-13 MED ORDER — NALOXONE HCL 4 MG/0.1ML NA LIQD
NASAL | 0 refills | Status: DC
Start: 2017-01-13 — End: 2017-11-08

## 2017-01-13 MED ORDER — ACETIC ACID 5 % SOLN
Status: AC
Start: 2017-01-13 — End: ?
  Filled 2017-01-13: qty 500

## 2017-01-13 MED ORDER — CEFAZOLIN SODIUM 1 G IJ SOLR
INTRAMUSCULAR | Status: DC | PRN
Start: 2017-01-13 — End: 2017-01-13
  Administered 2017-01-13: 1 g via INTRAVENOUS

## 2017-01-13 MED ORDER — ONDANSETRON HCL 4 MG/2ML IJ SOLN
INTRAMUSCULAR | Status: DC | PRN
Start: 2017-01-13 — End: 2017-01-13
  Administered 2017-01-13: 4 mg via INTRAVENOUS

## 2017-01-13 MED ORDER — SODIUM CHLORIDE 0.9 % IV SOLN
INTRAVENOUS | Status: DC
Start: 2017-01-13 — End: 2017-01-13

## 2017-01-13 SURGICAL SUPPLY — 44 items
APPLICATOR PROCTOSCOPIC 16IN (Applicator) ×2 IMPLANT
CANISTER MDIVAC CRD LINERW LI (Suction) ×1
CONTAINER SPECIMEN 4 OZ STRL (Procedure Accessories) ×2 IMPLANT
ELECTRODE BALL 3MM BLACK (Cautery) IMPLANT
ELECTRODE ELECTROSURGICAL BALL L11 CM (Cautery) IMPLANT
ELECTRODE ELECTROSURGICAL BALL L11 CM OD5 MM ODSEC3/32 IN UTAHBALL (Cautery) IMPLANT
ELECTRODE ELECTROSURGICAL ROUND LOOP L10 (Instrument) IMPLANT
ELECTRODE ELECTROSURGICAL ROUND LOOP L10 MM X W10 MM OD3/32 IN (Instrument) IMPLANT
ELECTRODE ELECTROSURGICAL ROUND LOOP L12 (Cautery) ×1 IMPLANT
ELECTRODE ELECTROSURGICAL ROUND LOOP L12 MM X W15 MM OD3/32 IN (Cautery) IMPLANT
ELECTRODE ELECTROSURGICAL ROUND LOOP L15 (Cautery) IMPLANT
ELECTRODE ELECTROSURGICAL ROUND LOOP L15 MM X W20 MM OD3/32 IN (Cautery) IMPLANT
ELECTRODE ESURG SS BALL UTAHBALL 5MM (Cautery)
ELECTRODE ESURG SS TUNG PLS RND LOOP (Instrument)
ELECTRODE ESURG SS TUNG RND LOOP (Cautery) ×1
ELECTRODE RED BALL CAUTERY 5MM (Cautery)
GLOVE SRG 6.5 BGL M LTX STRL PF TXTR (Glove) ×1
GLOVE SURG BIOGEL INDIC SZ 6.5 (Glove) ×2 IMPLANT
GLOVE SURG BIOGEL MICRO SZ6.5 (Glove) ×1
GLOVE SURGICAL 6 1/2 BIOGEL M POWDER (Glove) ×1 IMPLANT
GLOVE SURGICAL 6 1/2 BIOGEL M POWDER FREE TEXTURE BEAD CUFF (Glove) ×1 IMPLANT
GOWN SMART IMPERVIOUS LARGE (Gown) ×4 IMPLANT
HANDLE LGHT LF STRL ADP LGHT CNTRL + TCH (Other) ×1
HANDLE LIGHT ADAPTIVE LIGHT CONTROL PLUS (Other) ×1 IMPLANT
HANDLE LIGHT ADAPTIVE LIGHT CONTROL PLUS TECHNOLOGY SNAP ON LENS TOUCH (Other) ×1 IMPLANT
HANDLE TRUMPF LIGHT  DISP (Other) ×1
KIT INFECTION CONTROL CUSTOM (Kits) ×2 IMPLANT
KIT INFECTION CONTROL CUSTOM IFOH03 (Kits) ×1 IMPLANT
LABEL SHEET CUSTOM GYN (Other) ×2 IMPLANT
LINER SCT 1500CC THNWL CRD MDVC LF LCK (Suction) ×1
LINER SUCTION 1500 CC LOCK LID SHUTOFF (Suction) ×1 IMPLANT
LOOP LETZ 10X10MM YLW LOT/10 (Instrument)
LOOP LETZ 15X12MM GREEN (Cautery) ×1
LOOP LETZ 20X15MM LOT/10 (Cautery)
PAD ELECTROSRG GRND REM W CRD (Procedure Accessories) ×2 IMPLANT
PENCIL ELECTRO PUSH BUTTON (Cautery) ×2 IMPLANT
SOL BETADINE SOLUTION 4 OZ (Prep) ×2 IMPLANT
SOL SCRUB POVIDONE IODINE 4OZ (Scrub Supplies) ×2 IMPLANT
SUTURE VICRYL 0 UR6 27IN (Suture) IMPLANT
TOWEL L26 IN X W17 IN COTTON PREWASH DELINT BLUE ACTISORB DELUXE (Procedure Accessories) ×1 IMPLANT
TOWEL OR STRL DLUX BLUE 10PK (Procedure Accessories) ×1
TOWEL SRG CTTN 26X17IN LF STRL PREWASH (Procedure Accessories) ×1 IMPLANT
TRAY D (Pack) ×2 IMPLANT
TRAY SKIN DRY SCRUB (Tray) ×2 IMPLANT
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
## 2017-01-13 NOTE — Op Note (Signed)
BRIEF OP NOTE    Date Time: 01/13/17 12:52 PM    Patient Name:   Catherine Spence    Date of Operation:   01/13/2017    Providers Performing:   Surgeon(s):  Rolene Arbour, MD    Assistant (s):   Circulator: Wiliam Ke, RN  Scrub Person: Gar Gibbon    Operative Procedure:   Procedure(s):  LEEP    Preoperative Diagnosis:   Pre-Op Diagnosis Codes:     * Cervical dysplasia [N87.9]    Postoperative Diagnosis:   Cervical dysplasia    Anesthesia:   Choice    Estimated Blood Loss:        Drains:   Drains: no    Specimens:       Findings:    cervical dysplasia    Complications:   none    Signed by: Rolene Arbour, MD                                                                            [facility] OR

## 2017-01-13 NOTE — Anesthesia Postprocedure Evaluation (Signed)
Anesthesia Post Evaluation    Patient: Catherine Spence    Procedures performed: Procedure(s) with comments:  LEEP - LEEP CONE BX  ASST=Y; EQUIP=N; MD REQ=30MINS; Q1=UNK    Anesthesia type: General TIVA    Patient location:Phase II PACU    Last vitals:   Vitals:    01/13/17 1320   BP: 112/82   Pulse: 65   Resp: 16   Temp:    SpO2: 100%       Post pain: Patient not complaining of pain, continue current therapy      Mental Status:alert     Respiratory Function: tolerating room air    Cardiovascular: stable    Nausea/Vomiting: patient not complaining of nausea or vomiting    Hydration Status: adequate    Post assessment: no apparent anesthetic complications    Signed by: Tillie Rung, 01/13/2017 1:50 PM

## 2017-01-13 NOTE — Transfer of Care (Signed)
Anesthesia Transfer of Care Note    Patient: Catherine Spence    Procedures performed: Procedure(s) with comments:  LEEP - LEEP CONE BX  ASST=Y; EQUIP=N; MD REQ=30MINS; Q1=UNK    Anesthesia type: General TIVA    Patient location:Phase II PACU    Last vitals:   Vitals:    01/13/17 1125   BP: 108/75   Pulse: 75   Resp: 18   Temp: 36.1 C (97 F)   SpO2: 97%       Post pain: Patient not complaining of pain, continue current therapy      Mental Status:awake    Respiratory Function: tolerating room air    Cardiovascular: stable    Nausea/Vomiting: patient not complaining of nausea or vomiting    Hydration Status: adequate    Post assessment: no apparent anesthetic complications    Signed by: Lennox Solders  01/13/17

## 2017-01-13 NOTE — Discharge Instr - AVS First Page (Addendum)
Reason for your Hospital Admission:  LEEP      Instructions for after your discharge:    Post Anesthesia Discharge Instructions    Although you may be awake and alert in the recovery room, small amounts of anesthetic remain in your system for about 24 hours.  You may feel tired and sleepy during this time.      You are advised to go directly home from the hospital.    Plan to stay at home and rest for the remainder of the day.    It is advisable to have someone with you at home for 24 hours after surgery.    Do not operate a motor vehicle, or any mechanical or electrical equipment for the next 24 hours.      Be careful when you are walking around, you may become dizzy.  The effects of anesthesia and/or medications are still present and drowsiness may occur    Do not consume alcohol, tranquilizers, sleeping medications, or any other non prescribed medication for the remainder of the day.    Diet:  begin with liquids, progress your diet as tolerated or as directed by your surgeon.  Nausea and vomiting may occur in the next 24 hours.    Voiding:  You need to urinate within 6-8 hours of your procedure. If you are unable to pass urine and are uncomfortable go to the nearest emergency department and notify your surgeon.    LEEP    After the procedure  You may have a watery, pink discharge and mild cramping following the procedure. Also, the solution used to decrease bleeding may cause a dark, vaginal discharge for a few days. Do not place anything in your vagina or have intercourse until yourhealthcare providertells you it is OK.Your cervix should heal completely within a few weeks.  When to call your healthcare provider  Call your healthcare providerright away if you have any of the following:   Heavy bleeding or bleeding with clots   Severe belly pain   Fever   Date Last Reviewed: 03/05/2015   2000-2018 [employer], [place]. [address] [place], [place] [address]. All rights reserved. This information  is not intended as a substitute for professional medical care. Always follow your healthcare professional's instructions.

## 2017-01-13 NOTE — Anesthesia Preprocedure Evaluation (Signed)
Anesthesia Evaluation    AIRWAY    Mallampati: II    TM distance: >3 FB  Neck ROM: full  Mouth Opening:full   CARDIOVASCULAR    regular and normal       DENTAL    no notable dental hx     PULMONARY    clear to auscultation     OTHER FINDINGS              Relevant Problems   (+) SOB (shortness of breath)               Anesthesia Plan    ASA 2     MAC                                 informed consent obtained    Plan discussed with CRNA.                   Signed by: Tillie Rung 01/13/17 12:12 PM

## 2017-01-16 ENCOUNTER — Encounter: Payer: Self-pay | Admitting: Obstetrics & Gynecology

## 2017-01-16 ENCOUNTER — Ambulatory Visit: Payer: Medicaid Other | Admitting: Obstetrics and Gynecology

## 2017-01-17 LAB — LAB USE ONLY - HISTORICAL SURGICAL PATHOLOGY

## 2017-01-18 ENCOUNTER — Ambulatory Visit: Payer: Medicaid Other | Admitting: Obstetrics and Gynecology

## 2017-01-18 NOTE — Op Note (Addendum)
The LEEP procedure indications were review with the patient preoperatively.  Consents were signed after a detailed description of the procedure and the associated risks and benefits.  Findings:   Normal vagina and cervix on initial evaluation. No overt ulcerations or lesions on initial examination. No overt non-staining ulcerations or lesions noted after application of Lugol's solution.  Description of Procedure:   The patient was taken to the operating room where general anesthesia was performed and noted to be adequate. The patient was then placed in the dorsal lithotomy position with the [facility] stirrups. The patient was then prepped and draped in the normal sterile fashion. A sterile weighted speculum was then placed in the vagina for adequate visualization of the cervix. A single-tooth tenaculum was then placed on the anterior lip of the cervix for manipulation. Lugol's solution was then applied to the cervix with adequate coating noted for visualization of cervical lesions. Findings noted as above. The electrocautery unit was set to cutting mode at 40 Watts and coagulation mode at 40 Watts. The wire loop electrode was introduced to the cervix at a perpendicular position to the cervical surface and the surface excised. The electrode was passed from the 6o'clock to 12 o'clock position, beginning and finishing approximately 5-mm from the cervical border. The cervical cone was tagged with a suture at the 12 o'clock position and placed in formalin for pathologic evaluation. A second "top hat" specimen was obtained from the endocervical canal using an 8mm diameter loop.  Finally, an endocervical curettage was performed using a Kevorkian curette.  The wire loop electrode was then replaced with a ball electrode for electrocoagulation. With coagulation mode set to 40 Watts, the ball electrode was utilized to electrocoagulate the base of the excised cervix. Adequate hemostasis was noted. . Adequate hemostasis was noted.  At this point, the tenaculum  was removed and pressure was placed on the tenaculum site to provide hemostasis.  Once hemostasis was noted, the speculum was removed and the patient was removed from the lithotomy position, awakened and transported to the recovery room in stable condition      Pre op diagnosis: dysplasia of cervix  Post op: Same  Procedure Leep loop conization with ECC  Surgeon F. Elward Nocera  EBL 50 cc  Complication None  Archie Endo, M.D

## 2017-01-23 ENCOUNTER — Ambulatory Visit: Payer: Medicaid Other | Admitting: Obstetrics and Gynecology

## 2017-01-24 NOTE — Op Note (Deleted)
BRIEF OP NOTE    Date Time: 01/24/17 9:50 AM    Patient Name:   Catherine Spence    Date of Operation:   01/13/2017    Providers Performing:   Surgeon(s):  Rolene Arbour, MD    Assistant (s):   Circulator: Wiliam Ke, RN  Scrub Person: Gar Gibbon    Operative Procedure:   Procedure(s):  LEEP    Preoperative Diagnosis:   Pre-Op Diagnosis Codes:     * Cervical dysplasia [N87.9]    Postoperative Diagnosis:   Cervical dysplasia    Anesthesia:   Choice    Estimated Blood Loss:        Drains:   Drains: no    Specimens:       Findings:    cervical dysplasia    Complications:   none    Signed by: Rolene Arbour, MD                                                                           [place] [facility] OR

## 2017-02-13 ENCOUNTER — Ambulatory Visit: Payer: Medicaid Other | Admitting: Obstetrics and Gynecology

## 2017-03-01 ENCOUNTER — Ambulatory Visit: Payer: Medicaid Other | Admitting: Obstetrics and Gynecology

## 2017-08-02 ENCOUNTER — Emergency Department
Admission: EM | Admit: 2017-08-02 | Discharge: 2017-08-02 | Disposition: A | Discharge status: Home / Self Care | Payer: Medicaid Other | Attending: Emergency Medicine | Admitting: Emergency Medicine

## 2017-08-02 ENCOUNTER — Emergency Department: Payer: Medicaid Other

## 2017-08-02 DIAGNOSIS — B373 Candidiasis of vulva and vagina: Secondary | ICD-10-CM | POA: Insufficient documentation

## 2017-08-02 DIAGNOSIS — R059 Cough, unspecified: Secondary | ICD-10-CM

## 2017-08-02 DIAGNOSIS — Z882 Allergy status to sulfonamides status: Secondary | ICD-10-CM | POA: Insufficient documentation

## 2017-08-02 DIAGNOSIS — Z886 Allergy status to analgesic agent status: Secondary | ICD-10-CM | POA: Insufficient documentation

## 2017-08-02 DIAGNOSIS — J3489 Other specified disorders of nose and nasal sinuses: Secondary | ICD-10-CM | POA: Insufficient documentation

## 2017-08-02 DIAGNOSIS — B3731 Acute candidiasis of vulva and vagina: Secondary | ICD-10-CM

## 2017-08-02 DIAGNOSIS — F17210 Nicotine dependence, cigarettes, uncomplicated: Secondary | ICD-10-CM | POA: Insufficient documentation

## 2017-08-02 DIAGNOSIS — Z880 Allergy status to penicillin: Secondary | ICD-10-CM | POA: Insufficient documentation

## 2017-08-02 DIAGNOSIS — Z885 Allergy status to narcotic agent status: Secondary | ICD-10-CM | POA: Insufficient documentation

## 2017-08-02 DIAGNOSIS — R05 Cough: Secondary | ICD-10-CM | POA: Insufficient documentation

## 2017-08-02 DIAGNOSIS — Z881 Allergy status to other antibiotic agents status: Secondary | ICD-10-CM | POA: Insufficient documentation

## 2017-08-02 LAB — URINALYSIS
Bilirubin, UA: NEGATIVE
Glucose, UA: NEGATIVE
Ketones UA: NEGATIVE
Nitrite, UA: NEGATIVE
Protein, UR: 30 — AB
Specific Gravity UA: 1.02 (ref 1.001–1.035)
Urine pH: 5 (ref 5.0–8.0)
Urobilinogen, UA: NORMAL mg/dL (ref 0.2–2.0)

## 2017-08-02 LAB — URINE HCG QUALITATIVE: Urine HCG Qualitative: NEGATIVE

## 2017-08-02 LAB — URINE MICROSCOPIC

## 2017-08-02 MED ORDER — MICONAZOLE NITRATE 2 % VA CREA
1.0000 | TOPICAL_CREAM | Freq: Every evening | VAGINAL | 0 refills | Status: AC
Start: 2017-08-02 — End: 2017-08-09

## 2017-08-02 MED ORDER — IPRATROPIUM BROMIDE 0.02 % IN SOLN
0.5000 mg | Freq: Once | RESPIRATORY_TRACT | Status: AC
Start: 2017-08-02 — End: 2017-08-02
  Administered 2017-08-02: 0.5 mg via RESPIRATORY_TRACT
  Filled 2017-08-02: qty 2.5

## 2017-08-02 MED ORDER — ALBUTEROL SULFATE HFA 108 (90 BASE) MCG/ACT IN AERS
2.0000 | INHALATION_SPRAY | RESPIRATORY_TRACT | 0 refills | Status: DC | PRN
Start: 2017-08-02 — End: 2017-09-01

## 2017-08-02 MED ORDER — RISAQUAD PO CAPS
1.0000 | ORAL_CAPSULE | Freq: Every day | ORAL | 0 refills | Status: AC
Start: 2017-08-02 — End: 2017-08-16

## 2017-08-02 MED ORDER — FLUTICASONE PROPIONATE 50 MCG/ACT NA SUSP
1.0000 | Freq: Every day | NASAL | 0 refills | Status: AC
Start: 2017-08-02 — End: 2017-08-09

## 2017-08-02 MED ORDER — ALBUTEROL SULFATE (2.5 MG/3ML) 0.083% IN NEBU
2.5000 mg | INHALATION_SOLUTION | Freq: Once | RESPIRATORY_TRACT | Status: AC
Start: 2017-08-02 — End: 2017-08-02
  Administered 2017-08-02: 2.5 mg via RESPIRATORY_TRACT
  Filled 2017-08-02: qty 3
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
## 2017-08-02 NOTE — Discharge Instructions (Signed)
Cough    You have been seen for your cough.    There are many possible causes of cough. Most are not dangerous. Your doctor has determined that it is OK for you to go home today.    Antibiotics are not necessary for your cough at this time. If your cough was caused by a virus, asthma, or lung irritation, then antibiotics will not help.    The doctor may have prescribed some medicine to help with your cough. Use the medicine as directed.    YOU SHOULD SEEK MEDICAL ATTENTION IMMEDIATELY, EITHER HERE OR AT THE NEAREST EMERGENCY DEPARTMENT, IF ANY OF THE FOLLOWING OCCURS:   You wheeze or have trouble breathing.   You cough up mucous or lose weight for no reason.   You have a fever (temperature higher than 100.46F / 38C) that lasts more than 5 days.   You have chest pain.   Your symptoms get worse or do not get better in 2 or 3 days.   You have any new problems or concerns.               Yeast    You have been diagnosed with a vaginal yeast infection.    Yeast infections are caused by an organism. This organism is called "Candida Albicans." We all have Candida living on our skin. Most of the time, the "good" bacteria in our bodies control its growth. Under certain conditions, the good bacteria can't control the Candida and a yeast infection can develop. This happens in otherwise healthy women. Yeast infections can also be linked to recent antibiotic use. A history of diabetes or HIV infection increases the risk of getting yeast infections.    Some yeast infection symptoms are: A white discharge and reddened skin near the opening of the vagina. There may also be itching that can be severe (serious).    You have been given a prescription for a cream. Use it in and around your vagina as directed.    YOU SHOULD SEEK MEDICAL ATTENTION IMMEDIATELY, EITHER HERE OR AT THE NEAREST EMERGENCY DEPARTMENT, IF ANY OF THE FOLLOWING OCCURS:   Blood from the vagina.   Abdominal (belly) pain.   Yeast infection on any  other part of your body (tongue or throat).

## 2017-10-30 ENCOUNTER — Emergency Department
Admission: EM | Admit: 2017-10-30 | Discharge: 2017-10-30 | Disposition: A | Discharge status: Home / Self Care | Payer: Medicaid Other | Attending: Emergency Medicine | Admitting: Emergency Medicine

## 2017-10-30 DIAGNOSIS — Z3202 Encounter for pregnancy test, result negative: Secondary | ICD-10-CM

## 2017-10-30 DIAGNOSIS — Z91040 Latex allergy status: Secondary | ICD-10-CM | POA: Insufficient documentation

## 2017-10-30 DIAGNOSIS — I10 Essential (primary) hypertension: Secondary | ICD-10-CM | POA: Insufficient documentation

## 2017-10-30 DIAGNOSIS — F17210 Nicotine dependence, cigarettes, uncomplicated: Secondary | ICD-10-CM | POA: Insufficient documentation

## 2017-10-30 DIAGNOSIS — K219 Gastro-esophageal reflux disease without esophagitis: Secondary | ICD-10-CM | POA: Insufficient documentation

## 2017-10-30 DIAGNOSIS — R5383 Other fatigue: Secondary | ICD-10-CM | POA: Insufficient documentation

## 2017-10-30 DIAGNOSIS — J45909 Unspecified asthma, uncomplicated: Secondary | ICD-10-CM | POA: Insufficient documentation

## 2017-10-30 LAB — HCG, SERUM, QUALITATIVE: Hcg Qualitative: NEGATIVE

## 2017-10-30 NOTE — Discharge Instructions (Signed)
Please rest and avoid aggravating activities. Please follow-up. Return to the ER for any concerns.     Weakness / Fatigue    You have been seen today for generalized weakness. This may also be described as fatigue.    Weakness is a common problem,     It is important to understand the difference between true weakness (real weakness from a nerve or brain problem) and the more common problem of fatigue. These words might seem similar but they do mean very different problems.   Fatigue: When a person is describing fatigue, they may feel tired out very quickly even with just a little activity. They may also say they are feeling tired, sleepy, easily exhausted and unable to do normal daily activities because they don't seem to have enough energy.   True Weakness: When someone has true weakness, it means that the muscles are not working right. For example, a leg might be truly weak if you can't support your weight on it or if you can't get up from a chair because the thigh muscles aren't strong enough.    There are many causes of weakness including; Infections (often kidney/bladder infections or pneumonias), electrolyte abnormalities (low sodium, low potassium), depression, and neurologic (brain or nerve) disorders.    After looking at the results of the blood tests or X-rays, the cause of your weakness is:   Unclear or unknown.    It is VERY IMPORTANT to see your primary care doctor. More testing may be needed to figure out the cause of your weakness.    YOU SHOULD SEEK MEDICAL ATTENTION IMMEDIATELY, EITHER HERE OR AT THE NEAREST EMERGENCY DEPARTMENT, IF ANY OF THE FOLLOWING OCCURS:   Confusion, coma, agitation (becoming anxious or irritable).   Fever (temperature higher than 100.67F / 38C), vomiting.   Severe headache.   Signs of stroke (paralysis or numbness on one side of the body, drooping on one side of the face, difficulty talking).   Worsening weakness, difficulty standing, paralysis, loss of  control of the bladder or bowels or difficulty swallowing.
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
## 2017-11-08 ENCOUNTER — Emergency Department
Admission: EM | Admit: 2017-11-08 | Discharge: 2017-11-08 | Disposition: A | Discharge status: Home / Self Care | Payer: Medicaid Other | Attending: Emergency Medicine | Admitting: Emergency Medicine

## 2017-11-08 DIAGNOSIS — K219 Gastro-esophageal reflux disease without esophagitis: Secondary | ICD-10-CM | POA: Insufficient documentation

## 2017-11-08 DIAGNOSIS — Z91040 Latex allergy status: Secondary | ICD-10-CM | POA: Insufficient documentation

## 2017-11-08 DIAGNOSIS — G479 Sleep disorder, unspecified: Secondary | ICD-10-CM | POA: Insufficient documentation

## 2017-11-08 DIAGNOSIS — R51 Headache: Secondary | ICD-10-CM | POA: Insufficient documentation

## 2017-11-08 DIAGNOSIS — I10 Essential (primary) hypertension: Secondary | ICD-10-CM | POA: Insufficient documentation

## 2017-11-08 DIAGNOSIS — N644 Mastodynia: Secondary | ICD-10-CM | POA: Insufficient documentation

## 2017-11-08 DIAGNOSIS — R110 Nausea: Secondary | ICD-10-CM

## 2017-11-08 DIAGNOSIS — F17210 Nicotine dependence, cigarettes, uncomplicated: Secondary | ICD-10-CM | POA: Insufficient documentation

## 2017-11-08 DIAGNOSIS — R519 Headache, unspecified: Secondary | ICD-10-CM

## 2017-11-08 LAB — URINALYSIS
Bilirubin, UA: NEGATIVE
Blood, UA: NEGATIVE
Glucose, UA: NEGATIVE
Ketones UA: NEGATIVE
Leukocyte Esterase, UA: NEGATIVE
Nitrite, UA: NEGATIVE
Protein, UR: NEGATIVE
Specific Gravity UA: 1.027 (ref 1.001–1.035)
Urine pH: 5 (ref 5.0–8.0)
Urobilinogen, UA: NORMAL mg/dL (ref 0.2–2.0)

## 2017-11-08 LAB — URINE HCG QUALITATIVE: Urine HCG Qualitative: NEGATIVE

## 2017-11-08 MED ORDER — PROMETHAZINE HCL 25 MG PO TABS
25.0000 mg | ORAL_TABLET | Freq: Once | ORAL | Status: AC
Start: 2017-11-08 — End: 2017-11-08
  Administered 2017-11-08: 25 mg via ORAL
  Filled 2017-11-08: qty 1

## 2017-11-08 MED ORDER — ONDANSETRON 4 MG PO TBDP
4.0000 mg | ORAL_TABLET | Freq: Four times a day (QID) | ORAL | 0 refills | Status: DC | PRN
Start: 2017-11-08 — End: 2018-01-11
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
## 2017-11-08 NOTE — EDIE (Signed)
COLLECTIVE?NOTIFICATION?11/08/2017 17:02?Catherine Spence, Catherine Spence?MRN: [id number]    Criteria Met      5 ED Visits in 12 Months    Security and Safety  No recent Security Events currently on file    ED Care Guidelines  There are currently no ED Care Guidelines for this patient. Please check your facility's medical records system.          [name] Visit Count (12 mo.)  Facility Visits   [place] [facility]: [place] 3   [facility] 2   Total 5   Note: Visits indicate total known visits.      Recent Emergency Department Visit Summary  Date Facility [facility] Type Diagnoses or Chief Complaint   Nov 08, 2017 [facility] Room: [facility]. [place] [facility]      vomiting and headahces      Oct 30, 2017 [place] [facility]: [place] [facility] [place] [facility]      pregnancy test      Possible Pregnancy      Other fatigue      Encounter for pregnancy test, result negative      Aug 02, 2017 [place] [facility]: [place] [place] [facility]      SOB      Cough      Difficulty breathing      Loss of appetite      Other specified disorders of nose and nasal sinuses      Candidiasis of vulva and vagina      Mar 27, 2017 [facility] [place] [place] [facility]     Mar 27, 2017 [facility] [place] [place] [facility]      sent by pt first chest pain      Chest Pain          Recent Inpatient Visit Summary  No recorded inpatient visits.     Care Team  There are no care providers on record at this time.   Collective Portal  This patient has registered at the [place] [facility]: [facility] Department   For more information visit: [email or web address]     PLEASE NOTE:    1.   Any care recommendations and other clinical information are provided as guidelines or for historical purposes only, and providers should exercise their own clinical judgment when providing care.    2.   You may only use this information for purposes of  treatment, payment or health care operations activities, and subject to the limitations of applicable Collective Policies.    3.   You should consult directly with the organization that provided a care guideline or other clinical history with any questions about additional information or accuracy or completeness of information provided.    ? 2019 [place], [employer]. - [email or web address]

## 2017-11-08 NOTE — Discharge Instructions (Signed)
Mastodynia    You have been diagnosed with mastodynia.     Mastodynia is pain in the breasts. It is common, and it has many causes. Some of the causes are cyclical. This means that they follow a pattern, most often related to your menstrual cycle (period). Other causes are noncyclical. This means that they don't follow a pattern. Mastodynia is different than mastitis. Mastitis happens when the milk ducts of the breast get blocked, which happens in women who are breastfeeding.     Most of the time cyclical breast pain is normal. It most often happens in the week or days before your period. It usually goes away when your period starts. This pain is caused by ovulation. This is because ovulation causes your breast tissue to grow. Birth control pills and hormone therapy can also cause pain in the breasts. You will most often feel pain in both breasts. The pain is strongest at the top of the breast and the place where the breasts connect to your chest.    Noncyclical breast pain has many causes.     The hormone changes of puberty, pregnancy, and menopause can cause pain in the breasts. However, you will most often feel pain on only one side of the breast. The pain might be constant or it might come and go.      Breast size is also important. Large breasts stretch the connective tissue that support the breast (called Cooper's ligaments), which can also cause pain.        Other causes of breast pain are tumors and infections such as abscess. (An abscess is an area of infected fluid that forms under your skin.) However, with an infection, your skin will most often be red or blistered, or show some other change. Most often, breast cancer is not painful.      Your breast pain might be caused by something other than your breasts. For example, you might have pain in the muscles close to the breasts. You might also have a problem with the nerves of your spine. These things make it seem like your breast is in pain.       Also, breast pain is connected to a medical problem called ductal ectasia. With ductal ectasia, you have a fever along with swelling in your milk ducts.    Most often, the cause of your breast pain can be found through your medical history and a physical exam. You might have more tests done if your doctor thinks your pain has a dangerous cause such as cancer. For example, you might have an ultrasound, a CAT scan, or an MRI.    Treatment usually depends on the cause of the pain. Anti-inflammatory medications are helpful. If your pain is very serious, though, you might get a medicine like tamoxifen (Nolvadex, Soltamox) or danazol (Danocrine).     Follow up with your primary care doctor or OB-GYN (women's doctor) for a breast exam every year. You should also follow up if the pain is still there.     If you have large breasts, wear a bra with good support.     Your doctor might have you take acetaminophen or an anti-inflammatory medicine such as ibuprofen. Your doctor might also give you an anti-inflammatory medicine that can be rubbed into your skin. Use the medicine as directed.     Don't drive or operate heavy machinery if you are given narcotic (opiate) pain medicine.    Though we don't believe your condition is  serious right now, it is important to be careful. Sometimes a problem that seems mild can become serious later. This is why it is very important that you return here or go to the nearest Emergency Department if you are not improving or your symptoms are getting worse.    YOU SHOULD SEEK MEDICAL ATTENTION IMMEDIATELY, EITHER HERE OR AT THE NEAREST EMERGENCY DEPARTMENT, IF ANY OF THE FOLLOWING OCCUR:     Your breast pain gets worse. Something unusual comes out of your breasts.    You get a fever (temperature higher than 100.46F or 38C) or chills.    You have uncontrollable nausea (feeling sick to your stomach). You have uncontrollable vomiting (throwing up).     If you can't follow up  with your doctor, or if at any time you feel you need to be rechecked or seen again, come back here or go to the nearest emergency department.             Headache    You have been treated for a headache.    Headaches are very common. Most of the time they are benign (not harmful). Some headaches can be very serious. Your headache appears to be benign. The doctor feels it is OK for you to go home.    If you continue to have headaches, or if this headache does not resolve over the next few days, you should be evaluated by your regular doctor or a neurologist. Keep a "headache diary." This may help your doctor learn the cause of your headaches.    Take your headache medication as directed. This is especially important if your doctor has placed you on a daily medication to prevent headaches.    YOU SHOULD SEEK MEDICAL ATTENTION IMMEDIATELY, EITHER HERE OR AT THE NEAREST EMERGENCY DEPARTMENT, IF ANY OF THE FOLLOWING OCCURS:   Your headache gets worse.   You have a severe headache that occurs suddenly.   Your head pain is different from your normal headache.   You have a fever (temperature higher than 100.46F / 38C), especially with a stiff neck.   You feel numbness, tingling, or weakness in your arms or legs.   You pass out.   You have problems with your vision.   You vomit and have trouble taking medication or keeping it down.               Nausea    You have been seen for nausea.    Nausea is the feeling that you are going to vomit (throw up). Nausea is not a disease. It is a symptom of another problem. For example, nausea, vomiting and diarrhea are symptoms of a stomach virus (the "stomach flu").    You may or may not vomit when you have nausea.     The nausea itself is not dangerous but it can be very uncomfortable.    We might not be able to find out today what is causing your nausea. It is VERY IMPORTANT to see your doctor who can watch for any serious problems.    There are many treatments for  nausea. The medical staff will discuss these with you. Some common medicines used to help with nausea are   Promethazine (Phenergan), prochlorperazine (Compazine), metoclopramide (Reglan), ondansetron (Zofran), and many others.    Follow a clear liquid diet. Drink water, broth, 7-Up, Sprite, or other clear caffeine-free soft drinks or sports drinks until you feel better. This might help the nausea and keep you  from vomiting.     If your nausea lasts for longer than a few days, or if you have new symptoms, we STRONGLY RECOMMEND that you go to see your family doctor, specialist or clinic. If you cannot get an appointment or do not have a doctor you can always return here or go to the nearest emergency department to be seen again.    YOU SHOULD SEEK MEDICAL ATTENTION IMMEDIATELY, EITHER HERE OR AT THE NEAREST EMERGENCY DEPARTMENT, IF ANY OF THE FOLLOWING OCCURS:   You vomit often.   You have abdominal (belly) pain.   You vomit blood or anything that looks like coffee grounds.   You have a headache.   You have any new symptoms or concerns.   You feel more "unwell."             Insomnia (Edu)    You have been given information about insomnia.    The [employer] reports that insomnia is the most common sleep problem for Americans. If you have trouble falling or staying asleep with tiredness or daytime drowsiness for more than a month, you may have insomnia.    Insomnia may be short-term or long-term. The [employer] of Neurological Disorders and Stroke has reported on chronic, long-term sleep disorders. It shows that such disorders affect at least 40 million Americans each year. Untreated sleep deprivation (not sleeping enough) gets in the way of work, your ability to drive and normal day-to-day activities. It can also have bad effects on physical and mental health.    Insomnia affects mood, energy level, and physical health. Common complaints like headaches and backaches can be from  sleep deprivation. S o can gastrointestinal (stomach) problems. In addition, it raises risk for infections and colds, etc. Studies show chronic sleep deprivation can weaken the immune system.    Many health and lifestyle factors can contribute to insomnia. These include stress, depression and medical illnesses. They also include pain and medicines. They also include specific sleep disorders like sleep apnea, for example.    Fortunately, insomnia can be treated. Most of these are lifestyle changes. They also involve good sleep habits (sleep hygiene). Effective medicines may be a short-term solution. Many medicines can cause side-effects in some patients. Therefore, using sleep aids for a long time is generally not a good idea.   Some sleep medicines are prescription-strength. These include: Zolpidem (Ambien) and eszopiclone (Lunesta), ramelteon (Rozerem), zaleplon (Sonata) and others are as well.   Most common over-the-counter (without prescription) sleep aids have diphenhydramine. This is an antihistamine and is the active ingredient in Benadryl. Diphenhydramine, doxylamine and other antihistamines have the side-effect of making some people sleepy. Sometimes, antihistamines have the opposite effect. This is more common in children. They can make the user restless, agitated or even mildly hyperactive. Some over-the-counter sleep aids are Nytol, Sominex, Unisom and others. Melatonin is an herbal product you can get over the counter (without a prescription). It acts in the sleep areas of the brain and helps with mild insomnia.    For more information on sleep disorders, talk to your doctor. You can also contact the [employer] at [phone]. You can also visit [email or web address]

## 2017-12-08 ENCOUNTER — Emergency Department
Admission: EM | Admit: 2017-12-08 | Discharge: 2017-12-08 | Disposition: A | Discharge status: Home / Self Care | Payer: Medicaid Other | Attending: Emergency Medicine | Admitting: Emergency Medicine

## 2017-12-08 DIAGNOSIS — T7840XA Allergy, unspecified, initial encounter: Secondary | ICD-10-CM | POA: Insufficient documentation

## 2017-12-08 DIAGNOSIS — X58XXXA Exposure to other specified factors, initial encounter: Secondary | ICD-10-CM | POA: Insufficient documentation

## 2017-12-08 DIAGNOSIS — F17210 Nicotine dependence, cigarettes, uncomplicated: Secondary | ICD-10-CM | POA: Insufficient documentation

## 2017-12-08 MED ORDER — PREDNISONE 20 MG PO TABS
40.0000 mg | ORAL_TABLET | Freq: Every day | ORAL | 0 refills | Status: AC
Start: 2017-12-09 — End: 2017-12-11

## 2017-12-08 MED ORDER — PREDNISONE 20 MG PO TABS
60.0000 mg | ORAL_TABLET | Freq: Once | ORAL | Status: AC
Start: 2017-12-08 — End: 2017-12-08
  Administered 2017-12-08: 60 mg via ORAL
  Filled 2017-12-08: qty 3

## 2017-12-08 MED ORDER — DIPHENHYDRAMINE HCL 25 MG PO TABS
25.0000 mg | ORAL_TABLET | Freq: Four times a day (QID) | ORAL | 0 refills | Status: AC
Start: 2017-12-08 — End: 2017-12-10

## 2017-12-08 MED ORDER — TINIDAZOLE 500 MG PO TABS
2.0000 g | ORAL_TABLET | Freq: Every morning | ORAL | 0 refills | Status: AC
Start: 2017-12-08 — End: 2017-12-15
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
## 2017-12-08 NOTE — Discharge Instructions (Signed)
Allergic Reaction     You have been seen for an allergic reaction.     An allergic reaction is when your body reacts to something it comes in contact with. This can be something you ate. It can also be something that got on your skin or that you breathed in. Insect bites sometimes cause this reaction. Wasp, hornet and bee stings often cause this reaction.     Allergic reactions can cause a few things to happen. Some people get hives (large raised welts). Others get blistered skin. More serious reactions include swelling of the lips and/or tongue. They also include difficulty breathing. This can be with or without wheezing.     Often, the exact cause of the allergic reaction is never found.     If you find you are allergic to something, avoid it. Future allergic reactions could get much worse.     General treatment for an allergic reaction includes antihistamines like diphenhydramine (Benadryl®) or prescription strength hydroxyzine (Atarax®) and steroids. Some antacids also act like antihistamines. These include famotidine (Pepcid®), ranitidine (Zantac®) or cimetidine (Tagamet®). These are often used to help with the allergic reaction.     If you get a steroid prescription, it is important to finish the entire prescription. The allergic reaction can come back suddenly (rebound) if you suddenly stop the steroid too early.     YOU SHOULD SEEK MEDICAL ATTENTION IMMEDIATELY, EITHER HERE OR AT THE NEAREST EMERGENCY DEPARTMENT, IF ANY OF THE FOLLOWING OCCURS:  · Your lips or tongue get swollen.  · You have trouble breathing or start wheezing.  · Your rash seems to get infected. Signs of infection are skin redness, pain, pus, swelling or fever (temperature higher than 100.4ºF / 38ºC).

## 2017-12-08 NOTE — EDIE (Signed)
COLLECTIVE?NOTIFICATION?12/08/2017 09:10?Catherine Spence, Catherine Spence?MRN: [id number]    Criteria Met      5 ED Visits in 12 Months    Security and Safety  No recent Security Events currently on file    ED Care Guidelines  There are currently no ED Care Guidelines for this patient. Please check your facility's medical records system.          [name] Visit Count (12 mo.)  Facility Visits   [place] [facility]: [place] 4   [facility] 2   Total 6   Note: Visits indicate total known visits.      Recent Emergency Department Visit Summary  Date Facility [facility] Type Diagnoses or Chief Complaint   Dec 08, 2017 [facility] Emergency Room: [facility] [place] [facility]      allergic reaction      Nov 08, 2017 [place] [facility]: [place] [place] [facility]      vomiting and headahces      Fatigue      Emesis      Possible Pregnancy      Nausea      Sleep disorder, unspecified      Headache      Mastodynia      Oct 30, 2017 [place] [facility]: [place] [facility] [place] [facility]      pregnancy test      Possible Pregnancy      Other fatigue      Encounter for pregnancy test, result negative      Aug 02, 2017 [place] [facility]: [place] [facility] [place] [facility]      SOB      Cough      Difficulty breathing      Loss of appetite      Other specified disorders of nose and nasal sinuses      Candidiasis of vulva and vagina      Mar 27, 2017 [facility] [place] [place] [facility]     Mar 27, 2017 [facility] [place] [place] [facility]      sent by pt first chest pain      Chest Pain          Recent Inpatient Visit Summary  No recorded inpatient visits.     Care Team  There are no care providers on record at this time.   Collective Portal  This patient has registered at the [place] [facility]: [facility] Emergency Department   For more information visit: [email or web address]     PLEASE NOTE:    1.   Any care recommendations and other  clinical information are provided as guidelines or for historical purposes only, and providers should exercise their own clinical judgment when providing care.    2.   You may only use this information for purposes of treatment, payment or health care operations activities, and subject to the limitations of applicable Collective Policies.    3.   You should consult directly with the organization that provided a care guideline or other clinical history with any questions about additional information or accuracy or completeness of information provided.    ? 2019 [place], [employer]. - [email or web address]

## 2018-01-11 ENCOUNTER — Emergency Department
Admission: EM | Admit: 2018-01-11 | Discharge: 2018-01-11 | Disposition: A | Discharge status: Home / Self Care | Payer: Medicaid Other | Attending: Emergency Medicine | Admitting: Emergency Medicine

## 2018-01-11 DIAGNOSIS — I10 Essential (primary) hypertension: Secondary | ICD-10-CM | POA: Insufficient documentation

## 2018-01-11 DIAGNOSIS — S0093XA Contusion of unspecified part of head, initial encounter: Secondary | ICD-10-CM | POA: Insufficient documentation

## 2018-01-11 DIAGNOSIS — F17210 Nicotine dependence, cigarettes, uncomplicated: Secondary | ICD-10-CM | POA: Insufficient documentation
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
## 2018-04-25 ENCOUNTER — Emergency Department
Admission: EM | Admit: 2018-04-25 | Discharge: 2018-04-25 | Disposition: A | Discharge status: Home / Self Care | Payer: Medicaid Other | Attending: Emergency Medicine | Admitting: Emergency Medicine

## 2018-04-25 ENCOUNTER — Emergency Department: Payer: Medicaid Other

## 2018-04-25 DIAGNOSIS — R05 Cough: Secondary | ICD-10-CM | POA: Insufficient documentation

## 2018-04-25 DIAGNOSIS — I10 Essential (primary) hypertension: Secondary | ICD-10-CM | POA: Insufficient documentation

## 2018-04-25 DIAGNOSIS — R6889 Other general symptoms and signs: Secondary | ICD-10-CM

## 2018-04-25 MED ORDER — ALBUTEROL SULFATE (2.5 MG/3ML) 0.083% IN NEBU
2.5000 mg | INHALATION_SOLUTION | Freq: Once | RESPIRATORY_TRACT | Status: AC
Start: 2018-04-25 — End: 2018-04-25
  Administered 2018-04-25: 2.5 mg via RESPIRATORY_TRACT
  Filled 2018-04-25: qty 3

## 2018-04-25 MED ORDER — PREDNISONE 20 MG PO TABS
40.0000 mg | ORAL_TABLET | Freq: Every day | ORAL | 0 refills | Status: AC
Start: 2018-04-25 — End: 2018-04-29

## 2018-04-25 MED ORDER — IPRATROPIUM BROMIDE 0.02 % IN SOLN
0.5000 mg | Freq: Once | RESPIRATORY_TRACT | Status: AC
Start: 2018-04-25 — End: 2018-04-25
  Administered 2018-04-25: 0.5 mg via RESPIRATORY_TRACT
  Filled 2018-04-25: qty 2.5

## 2018-04-25 MED ORDER — PREDNISONE 20 MG PO TABS
60.0000 mg | ORAL_TABLET | Freq: Once | ORAL | Status: AC
Start: 2018-04-25 — End: 2018-04-25
  Administered 2018-04-25: 60 mg via ORAL
  Filled 2018-04-25: qty 3
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
## 2018-04-25 NOTE — Discharge Instructions (Signed)
Flu-Like Illness     You have been diagnosed with a flu-like illness.     A flu-like illness is caused by a virus. A viral infection is different from a bacterial infection because it cannot be killed with antibiotics. Viral infections are much more common than bacterial infections. Viral infections cause conditions like common cold, bronchitis, mononucleosis (mono) and pneumonia.      Common symptoms of a flu-like illness are:  · Fever (temperature higher than 100.4ºF or 38ºC) and chills.  · Cough.  · Sore throat.  · Headache.  · Nausea (sick to the stomach) or vomiting (throwing up).  · Diarrhea.  · Muscle pains (myalgias).     At this time, it doesn’t seem your symptoms are caused by anything dangerous. You do not need to stay in the hospital. You do not need treatment with antibiotics.     Though we don’t believe your condition is dangerous right now, it is important to be careful. Sometimes a problem that seems mild can become serious later. This is why it is very important that you return here or go to the nearest Emergency Department if you are not improving or your symptoms are getting worse.     Some things you can try at home to improve symptoms are:  · Acetaminophen (Tylenol®) or NSAIDS like ibuprofen (Motrin®) or naproxen (Aleve®).  · Over-the-counter decongestants.  · Over-the-counter cough medications.     YOU SHOULD SEEK MEDICAL ATTENTION IMMEDIATELY, EITHER HERE OR AT THE NEAREST EMERGENCY DEPARTMENT, IF ANY OF THE FOLLOWING OCCUR:     · Your symptoms get better and then come back or get worse. This may mean a bacterial infection is forming.  · You have chest pain or shortness of breath.  · You can't keep fluids down or your vomit is dark green.  · You throw up blood or see blood in your stool. Blood might be bright red or dark red. It can also be black and look like tar.  · You have a severe headache or notice you cannot bend your neck.     If you can't follow up with your doctor, or if at any time  you feel you need to be rechecked or seen again, come back here or go to the nearest emergency department.
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
## 2018-06-11 ENCOUNTER — Emergency Department
Admission: EM | Admit: 2018-06-11 | Discharge: 2018-06-11 | Disposition: A | Discharge status: Home / Self Care | Payer: Medicaid Other | Attending: Emergency Medicine | Admitting: Emergency Medicine

## 2018-06-11 ENCOUNTER — Emergency Department: Payer: Medicaid Other

## 2018-06-11 DIAGNOSIS — R0789 Other chest pain: Secondary | ICD-10-CM | POA: Insufficient documentation

## 2018-06-11 DIAGNOSIS — K219 Gastro-esophageal reflux disease without esophagitis: Secondary | ICD-10-CM

## 2018-06-11 DIAGNOSIS — I10 Essential (primary) hypertension: Secondary | ICD-10-CM | POA: Insufficient documentation

## 2018-06-11 LAB — CBC AND DIFFERENTIAL
Absolute NRBC: 0 10*3/uL (ref 0.00–0.00)
Basophils Absolute Automated: 0.01 10*3/uL (ref 0.00–0.08)
Basophils Automated: 0.1 %
Eosinophils Absolute Automated: 0.1 10*3/uL (ref 0.00–0.44)
Eosinophils Automated: 1.3 %
Hematocrit: 39.1 % (ref 34.7–43.7)
Hgb: 12.3 g/dL (ref 11.4–14.8)
Immature Granulocytes Absolute: 0.01 10*3/uL (ref 0.00–0.07)
Immature Granulocytes: 0.1 %
Lymphocytes Absolute Automated: 2.94 10*3/uL (ref 0.42–3.22)
Lymphocytes Automated: 38.9 %
MCH: 25.1 pg (ref 25.1–33.5)
MCHC: 31.5 g/dL (ref 31.5–35.8)
MCV: 79.8 fL (ref 78.0–96.0)
MPV: 10.1 fL (ref 8.9–12.5)
Monocytes Absolute Automated: 0.44 10*3/uL (ref 0.21–0.85)
Monocytes: 5.8 %
Neutrophils Absolute: 4.06 10*3/uL (ref 1.10–6.33)
Neutrophils: 53.8 %
Nucleated RBC: 0 /100 WBC (ref 0.0–0.0)
Platelets: 259 10*3/uL (ref 142–346)
RBC: 4.9 10*6/uL (ref 3.90–5.10)
RDW: 15 % (ref 11–15)
WBC: 7.56 10*3/uL (ref 3.10–9.50)

## 2018-06-11 LAB — COMPREHENSIVE METABOLIC PANEL
ALT: 13 U/L (ref 0–55)
AST (SGOT): 11 U/L (ref 5–34)
Albumin/Globulin Ratio: 1.4 (ref 0.9–2.2)
Albumin: 3.7 g/dL (ref 3.5–5.0)
Alkaline Phosphatase: 65 U/L (ref 37–106)
Anion Gap: 8 (ref 5.0–15.0)
BUN: 10.1 mg/dL (ref 7.0–19.0)
Bilirubin, Total: 0.4 mg/dL (ref 0.2–1.2)
CO2: 23 mEq/L (ref 22–29)
Calcium: 7.8 mg/dL — ABNORMAL LOW (ref 8.5–10.5)
Chloride: 109 mEq/L (ref 100–111)
Creatinine: 0.7 mg/dL (ref 0.6–1.0)
Globulin: 2.6 g/dL (ref 2.0–3.6)
Glucose: 84 mg/dL (ref 70–100)
Potassium: 4.3 mEq/L (ref 3.5–5.1)
Protein, Total: 6.3 g/dL (ref 6.0–8.3)
Sodium: 140 mEq/L (ref 136–145)

## 2018-06-11 LAB — HCG, SERUM, QUALITATIVE: Hcg Qualitative: NEGATIVE

## 2018-06-11 LAB — GFR: EGFR: >60

## 2018-06-11 MED ORDER — FAMOTIDINE 10 MG/ML IV SOLN (WRAP)
20.00 mg | Freq: Once | INTRAVENOUS | Status: AC
Start: 2018-06-11 — End: 2018-06-11
  Administered 2018-06-11: 20 mg via INTRAVENOUS
  Filled 2018-06-11: qty 2

## 2018-06-11 MED ORDER — SODIUM CHLORIDE 0.9 % IV BOLUS
1000.00 mL | Freq: Once | INTRAVENOUS | Status: AC
Start: 2018-06-11 — End: 2018-06-11
  Administered 2018-06-11: 1000 mL via INTRAVENOUS

## 2018-06-11 MED ORDER — FAMOTIDINE 20 MG PO TABS
20.00 mg | ORAL_TABLET | Freq: Two times a day (BID) | ORAL | 0 refills | Status: AC
Start: 2018-06-11 — End: 2018-06-16

## 2018-06-11 NOTE — ED Triage Notes (Signed)
Pt. C/o recent upper respiratory infection since last week, last night pt c/o upper back pain between shoulders with chest pain radiates to left arm after smoking a blunt to calm her anxiety

## 2018-06-11 NOTE — Discharge Instructions (Signed)
Chest Pain of Unclear Etiology     You have been seen for chest pain. The cause of your pain is not yet known.     Your doctor has learned about your medical history, examined you, and checked any tests that were done. Still, it is not clear why you are having pain. The doctor thinks there is only a small chance that your pain is caused by a health problem that could lead to serious harm or death. Later, your primary care doctor might do more tests or check you again.     Sometimes chest pain is caused by a health problem that can lead to death, like a:  · Heart attack.  · Injury to the large blood vessel in your body (aorta).  · Blood clot in the lung.  · Collapsed lung.      It is not likely that your pain is caused by a health problem that could lead to death if:   · Your chest pain lasts only a few seconds at a time  · You are not short of breath, nauseated (sick to your stomach), sweaty, or lightheaded  · Your pain gets worse when you twist or bend  · Your pain improves with exercise or hard work.     Chest pain is serious. It is very important that you follow up with your regular doctor.     Return here or go to the nearest Emergency Department immediately if:  · Your pain makes you short of breath, nauseated (sick to your stomach), or sweaty.  · Your pain gets worse when you walk, go up stairs, or exert yourself.  · You feel weak, lightheaded, or faint.  · It hurts to breathe.  · Your leg swells.  · Your pain or symptoms get worse   · You have new symptoms or concerns.     If you can't follow up with your doctor, or if at any time you feel you need to be rechecked or seen again, come back here or go to the nearest emergency department.             Gastroesophageal Reflux Disease, GERD     You have been diagnosed with gastroesophageal reflux disease. This is sometimes called GERD.     With this condition, acid from the stomach backs up into the esophagus (food pipe). Most people have this as occasional  heartburn. Sometimes, the backup happens so often that the symptoms are severe (serious). Over time, damage may be done to the esophagus.     Medicines may be prescribed to lower acid secretion in the stomach or coat the esophagus. Take all medicines as directed.     There is no evidence that spicy foods cause reflux. Some people find that avoiding caffeine, alcohol, and spicy and acidic foods lowers discomfort. Just pay attention to what you eat. If a food makes symptoms worse, don't eat it.     Don't lie down after eating.     Don't smoke. Smoking may relax the esophagus muscles that keep acid in the stomach.     Obesity (being overweight) may make reflux symptoms worse. Talk to your doctor about a weight loss plan.     Do not eat less than 2-3 hours before bedtime.     You can buy antacids over the counter (without a prescription). Use them as directed to relieve symptoms.     Follow up as directed. You may be referred to a gastroenterologist (digestive system doctor) for further evaluation.     YOU SHOULD SEEK MEDICAL ATTENTION IMMEDIATELY, EITHER HERE   OR AT THE NEAREST EMERGENCY DEPARTMENT, IF ANY OF THE FOLLOWING OCCURS:  · Your pain increases or changes.  · New or different chest pain.  · You are vomiting (throwing up) constantly or vomiting blood or material that looks like "coffee grounds."  · Your stool has blood, gets very dark or looks like tar.
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
## 2018-06-12 LAB — ECG 12-LEAD
Atrial Rate: 66 {beats}/min
P Axis: 30 degrees
P-R Interval: 172 ms
Q-T Interval: 408 ms
QRS Duration: 88 ms
QTC Calculation (Bezet): 427 ms
R Axis: 15 degrees
T Axis: 28 degrees
Ventricular Rate: 66 {beats}/min

## 2018-08-21 ENCOUNTER — Emergency Department
Admission: EM | Admit: 2018-08-21 | Discharge: 2018-08-21 | Disposition: A | Discharge status: Home / Self Care | Payer: Medicaid Other | Attending: Emergency Medicine | Admitting: Emergency Medicine

## 2018-08-21 ENCOUNTER — Emergency Department: Payer: Medicaid Other

## 2018-08-21 DIAGNOSIS — I10 Essential (primary) hypertension: Secondary | ICD-10-CM | POA: Insufficient documentation

## 2018-08-21 DIAGNOSIS — N83201 Unspecified ovarian cyst, right side: Secondary | ICD-10-CM | POA: Insufficient documentation

## 2018-08-21 DIAGNOSIS — R55 Syncope and collapse: Secondary | ICD-10-CM | POA: Insufficient documentation

## 2018-08-21 DIAGNOSIS — K429 Umbilical hernia without obstruction or gangrene: Secondary | ICD-10-CM

## 2018-08-21 LAB — URINALYSIS REFLEX TO MICROSCOPIC EXAM - REFLEX TO CULTURE
Bilirubin, UA: NEGATIVE
Blood, UA: NEGATIVE
Glucose, UA: NEGATIVE
Ketones UA: NEGATIVE
Leukocyte Esterase, UA: NEGATIVE
Nitrite, UA: NEGATIVE
Protein, UR: 30 — AB
Specific Gravity UA: 1.027 (ref 1.001–1.035)
Urine pH: 6 (ref 5.0–8.0)
Urobilinogen, UA: NORMAL mg/dL (ref 0.2–2.0)

## 2018-08-21 LAB — CBC AND DIFFERENTIAL
Absolute NRBC: 0 10*3/uL (ref 0.00–0.00)
Basophils Absolute Automated: 0.01 10*3/uL (ref 0.00–0.08)
Basophils Automated: 0.1 %
Eosinophils Absolute Automated: 0.11 10*3/uL (ref 0.00–0.44)
Eosinophils Automated: 1.6 %
Hematocrit: 38 % (ref 34.7–43.7)
Hgb: 11.9 g/dL (ref 11.4–14.8)
Immature Granulocytes Absolute: 0.01 10*3/uL (ref 0.00–0.07)
Immature Granulocytes: 0.1 %
Lymphocytes Absolute Automated: 2.48 10*3/uL (ref 0.42–3.22)
Lymphocytes Automated: 35.4 %
MCH: 25.3 pg (ref 25.1–33.5)
MCHC: 31.3 g/dL — ABNORMAL LOW (ref 31.5–35.8)
MCV: 80.7 fL (ref 78.0–96.0)
MPV: 10.4 fL (ref 8.9–12.5)
Monocytes Absolute Automated: 0.44 10*3/uL (ref 0.21–0.85)
Monocytes: 6.3 %
Neutrophils Absolute: 3.95 10*3/uL (ref 1.10–6.33)
Neutrophils: 56.5 %
Nucleated RBC: 0 /100 WBC (ref 0.0–0.0)
Platelets: 250 10*3/uL (ref 142–346)
RBC: 4.71 10*6/uL (ref 3.90–5.10)
RDW: 14 % (ref 11–15)
WBC: 7 10*3/uL (ref 3.10–9.50)

## 2018-08-21 LAB — COMPREHENSIVE METABOLIC PANEL
ALT: 12 U/L (ref 0–55)
AST (SGOT): 12 U/L (ref 5–34)
Albumin/Globulin Ratio: 1.4 (ref 0.9–2.2)
Albumin: 3.7 g/dL (ref 3.5–5.0)
Alkaline Phosphatase: 67 U/L (ref 37–106)
Anion Gap: 9 (ref 5.0–15.0)
BUN: 11 mg/dL (ref 7.0–19.0)
Bilirubin, Total: 0.3 mg/dL (ref 0.2–1.2)
CO2: 25 mEq/L (ref 22–29)
Calcium: 8.5 mg/dL (ref 8.5–10.5)
Chloride: 108 mEq/L (ref 100–111)
Creatinine: 0.8 mg/dL (ref 0.6–1.0)
Globulin: 2.6 g/dL (ref 2.0–3.6)
Glucose: 86 mg/dL (ref 70–100)
Potassium: 4.1 mEq/L (ref 3.5–5.1)
Protein, Total: 6.3 g/dL (ref 6.0–8.3)
Sodium: 142 mEq/L (ref 136–145)

## 2018-08-21 LAB — GFR: EGFR: >60

## 2018-08-21 LAB — LIPASE: Lipase: 22 U/L (ref 8–78)

## 2018-08-21 LAB — HCG, SERUM, QUALITATIVE: Hcg Qualitative: NEGATIVE

## 2018-08-21 MED ORDER — IOHEXOL 12 MG/ML PO SOLN
1000.00 mL | Freq: Once | ORAL | Status: AC
Start: 2018-08-21 — End: 2018-08-21
  Administered 2018-08-21: 1000 mL via ORAL

## 2018-08-21 MED ORDER — ONDANSETRON HCL 4 MG/2ML IJ SOLN
4.00 mg | Freq: Once | INTRAMUSCULAR | Status: AC
Start: 2018-08-21 — End: 2018-08-21
  Administered 2018-08-21: 4 mg via INTRAVENOUS
  Filled 2018-08-21: qty 2

## 2018-08-21 MED ORDER — IOHEXOL 350 MG/ML IV SOLN
100.00 mL | Freq: Once | INTRAVENOUS | Status: AC | PRN
Start: 2018-08-21 — End: 2018-08-21
  Administered 2018-08-21: 100 mL via INTRAVENOUS

## 2018-08-21 MED ORDER — SODIUM CHLORIDE 0.9 % IV BOLUS
1000.00 mL | Freq: Once | INTRAVENOUS | Status: AC
Start: 2018-08-21 — End: 2018-08-21
  Administered 2018-08-21: 1000 mL via INTRAVENOUS

## 2018-08-21 NOTE — Discharge Instructions (Signed)
Hernia    You have been diagnosed with a hernia.    Sometimes there is a weak spot in the abdominal wall (the tissue and muscle inside the belly). A hernia happens when a piece of intestine or surrounding fat pokes through this wall. This creates a bulge that you can see or feel with your hand.    If the intestine can be pushed back inside the wall, it is not dangerous.    Sometimes, the intestine that pokes through is twisted or trapped, causing severe (major) pain that does not go away.    Hernias are usually in the groin. They may also occur around the belly button or near a previous incision (cut). Hernias may happen very suddenly or slowly over a period of several months or years.    The hernia may cause a mild dull ache.    Your pain may be treated with medicine.    Limit heavy lifting, straining and pushing.    Your doctor may tell you to wear a binder or truss. This is like a brace that fits around the belly.    YOU SHOULD SEEK MEDICAL ATTENTION IMMEDIATELY, EITHER HERE OR AT THE NEAREST EMERGENCY DEPARTMENT, IF ANY OF THE FOLLOWING OCCURS:   The pain gets worse or changes.   The hernia sticks out and won't go back in. If this happens, the hernia may get trapped and the blood flow may be cut off. This can be a very dangerous problem that needs medical attention right away!   Nausea (feeling sick) or vomiting (throwing up).             Syncope    You have been diagnosed with syncope (pronounced SINK-uh-pee).    This is the medical term for a rapid loss of consciousness or a fainting episode. There are many causes of syncope. Some of these are life-threatening and others are not serious. Most patients with life-threatening causes are admitted to the hospital for further testing. Patients without life-threatening conditions may be sent home.    Some non-life threatening causes of syncope include dehydration, heat exhaustion, vasovagal events (simple fainting), and side-effects from new  medication.    Treatment of syncope depends on the cause. In general it is a good idea to drink plenty of fluids and avoid strenuous activity for at least 24 hours after fainting.    Follow up with your regular doctor within 3 days.    YOU SHOULD SEEK MEDICAL ATTENTION IMMEDIATELY, EITHER HERE OR AT THE NEAREST EMERGENCY DEPARTMENT, IF ANY OF THE FOLLOWING OCCURS:   You continue to faint (pass out).   You have any chest pain with your fainting.   You notice any palpitations or strange heart beats before fainting.   You notice any blood in your stools. Blood may be bright red or dark red or black and tar-like.   You feel weak or lightheaded or do not improve as expected.   You become worse or have other concerns.
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
## 2018-08-21 NOTE — ED Triage Notes (Signed)
Pt c/o syncope, nausea, emesis x2 today, abdominal hernia x5 years that has been progressively worse over past week, lethargy, loss of appetite x1week, possible pregnancy. Pt has Dr. Dorinda Hill ([facility]) has been monitoring hernia progress. Abdominal pain 10/10.

## 2018-08-24 LAB — ECG 12-LEAD
Atrial Rate: 59 {beats}/min
P Axis: 10 degrees
P-R Interval: 168 ms
Q-T Interval: 436 ms
QRS Duration: 88 ms
QTC Calculation (Bezet): 431 ms
R Axis: 3 degrees
T Axis: 23 degrees
Ventricular Rate: 59 {beats}/min

## 2018-09-03 ENCOUNTER — Encounter: Payer: Self-pay | Admitting: Surgery

## 2018-09-14 ENCOUNTER — Ambulatory Visit: Admission: RE | Admit: 2018-09-14 | Payer: Medicaid Other | Source: Ambulatory Visit | Admitting: Surgery

## 2018-09-14 ENCOUNTER — Encounter: Admission: RE | Payer: Self-pay | Source: Ambulatory Visit

## 2018-09-14 SURGERY — ROBOT XI ASSISTED, LAPAROSCOPIC, HERNIA REPAIR, UMBILICAL
Anesthesia: General | Site: Abdomen

## 2018-11-24 ENCOUNTER — Emergency Department
Admission: EM | Admit: 2018-11-24 | Discharge: 2018-11-24 | Disposition: A | Discharge status: Home / Self Care | Payer: Medicaid Other | Attending: Emergency Medicine | Admitting: Emergency Medicine

## 2018-11-24 DIAGNOSIS — Z87891 Personal history of nicotine dependence: Secondary | ICD-10-CM | POA: Insufficient documentation

## 2018-11-24 DIAGNOSIS — Z885 Allergy status to narcotic agent status: Secondary | ICD-10-CM | POA: Insufficient documentation

## 2018-11-24 DIAGNOSIS — Z91040 Latex allergy status: Secondary | ICD-10-CM | POA: Insufficient documentation

## 2018-11-24 DIAGNOSIS — K429 Umbilical hernia without obstruction or gangrene: Secondary | ICD-10-CM | POA: Insufficient documentation

## 2018-11-24 DIAGNOSIS — I10 Essential (primary) hypertension: Secondary | ICD-10-CM | POA: Insufficient documentation

## 2018-11-24 LAB — HCG, SERUM, QUALITATIVE: Hcg Qualitative: NEGATIVE

## 2018-11-24 LAB — CBC AND DIFFERENTIAL
Absolute NRBC: 0 10*3/uL (ref 0.00–0.00)
Basophils Absolute Automated: 0.01 10*3/uL (ref 0.00–0.08)
Basophils Automated: 0.1 %
Eosinophils Absolute Automated: 0.12 10*3/uL (ref 0.00–0.44)
Eosinophils Automated: 1.6 %
Hematocrit: 40.1 % (ref 34.7–43.7)
Hgb: 12.6 g/dL (ref 11.4–14.8)
Immature Granulocytes Absolute: 0.01 10*3/uL (ref 0.00–0.07)
Immature Granulocytes: 0.1 %
Lymphocytes Absolute Automated: 2.39 10*3/uL (ref 0.42–3.22)
Lymphocytes Automated: 32.4 %
MCH: 25.4 pg (ref 25.1–33.5)
MCHC: 31.4 g/dL — ABNORMAL LOW (ref 31.5–35.8)
MCV: 80.8 fL (ref 78.0–96.0)
MPV: 10.2 fL (ref 8.9–12.5)
Monocytes Absolute Automated: 0.48 10*3/uL (ref 0.21–0.85)
Monocytes: 6.5 %
Neutrophils Absolute: 4.36 10*3/uL (ref 1.10–6.33)
Neutrophils: 59.3 %
Nucleated RBC: 0 /100 WBC (ref 0.0–0.0)
Platelets: 277 10*3/uL (ref 142–346)
RBC: 4.96 10*6/uL (ref 3.90–5.10)
RDW: 14 % (ref 11–15)
WBC: 7.37 10*3/uL (ref 3.10–9.50)

## 2018-11-24 LAB — COMPREHENSIVE METABOLIC PANEL
ALT: 14 U/L (ref 0–55)
AST (SGOT): 17 U/L (ref 5–34)
Albumin/Globulin Ratio: 1.3 (ref 0.9–2.2)
Albumin: 3.7 g/dL (ref 3.5–5.0)
Alkaline Phosphatase: 65 U/L (ref 37–106)
Anion Gap: 8 (ref 5.0–15.0)
BUN: 13.9 mg/dL (ref 7.0–19.0)
Bilirubin, Total: 0.4 mg/dL (ref 0.2–1.2)
CO2: 24 mEq/L (ref 22–29)
Calcium: 8.5 mg/dL (ref 8.5–10.5)
Chloride: 108 mEq/L (ref 100–111)
Creatinine: 0.8 mg/dL (ref 0.6–1.0)
Globulin: 2.8 g/dL (ref 2.0–3.6)
Glucose: 89 mg/dL (ref 70–100)
Potassium: 4.7 mEq/L (ref 3.5–5.1)
Protein, Total: 6.5 g/dL (ref 6.0–8.3)
Sodium: 140 mEq/L (ref 136–145)

## 2018-11-24 LAB — GFR: EGFR: >60

## 2018-11-24 LAB — LIPASE: Lipase: 27 U/L (ref 8–78)

## 2018-11-24 MED ORDER — FAMOTIDINE 10 MG/ML IV SOLN (WRAP)
20.00 mg | Freq: Once | INTRAVENOUS | Status: AC
Start: 2018-11-24 — End: 2018-11-24
  Administered 2018-11-24: 20 mg via INTRAVENOUS
  Filled 2018-11-24: qty 2

## 2018-11-24 MED ORDER — FENTANYL CITRATE (PF) 50 MCG/ML IJ SOLN (WRAP)
75.00 ug | Freq: Once | INTRAMUSCULAR | Status: AC
Start: 2018-11-24 — End: 2018-11-24
  Administered 2018-11-24: 75 ug via INTRAVENOUS
  Filled 2018-11-24: qty 2

## 2018-11-24 MED ORDER — ONDANSETRON HCL 4 MG/2ML IJ SOLN
4.00 mg | Freq: Once | INTRAMUSCULAR | Status: AC
Start: 2018-11-24 — End: 2018-11-24
  Administered 2018-11-24: 4 mg via INTRAVENOUS
  Filled 2018-11-24: qty 2

## 2018-11-24 NOTE — ED Notes (Signed)
Dr H at bedside to manual reduce hernia.

## 2018-11-24 NOTE — ED Triage Notes (Addendum)
Pt states she has a "hernia that keeps getting stuck" and is causing her pain. Pt states that she has had the hernia for 5 years ago but has been painful for the past few days. Pt anxious and tearful during triage, cursing, refusing to answer some questions.
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
## 2018-11-24 NOTE — ED Notes (Addendum)
Pt sister came to get pt's keys to her car to move pt car.  LCSO also arrived to speak with staff and pt.

## 2018-11-24 NOTE — ED Notes (Signed)
Unable to complete triage at this time due to pt becoming belligerent, cursing, and throwing items in room.

## 2018-11-24 NOTE — Discharge Instructions (Signed)
Return to ER immediately if worse in any way or for any other concern.       Umbilical Hernia    You have been diagnosed with an umbilical hernia.    A hernia is an abnormal opening in the abdominal (belly) wall. Most hernias happen when a piece of intestine or fat pokes through a weak part in the abdominal wall. This creates a bulge. You might be able to see or feel it.    Hernias are usually in the groin. They can also happen around any past incision (cut). Your hernia is around the umbilicus (belly button). Hernias may develop very suddenly or very slowly. They may take several months or years to develop.    Sometimes, the part of the intestine that slips through the hernia gets twisted (strangulated). It can also get trapped (incarcerated). This often causes severe (serious) and persistent (constant) pain that does not go away. If this happens, lie down. Push on the bulge gently with your hand. The pain may get worse or not go away. If so, return here or go to the nearest Emergency Department IMMEDIATELY.    If the bowel or tissue can be pushed back through the hernia, it is not dangerous.    Umbilical hernias may cause a dull ache or mild pain. Usually, only acetaminophen (Tylenol) or ibuprofen (Advil or Motrin) is needed for pain control.    Limit heavy lifting, straining and pushing.    Your doctor may direct you to wear a binder or truss. This is like a brace around your belly.    Most hernias need surgery to be fixed. You have been referred to:   A surgeon for further evaluation of your hernia.    YOU SHOULD SEEK MEDICAL ATTENTION IMMEDIATELY, EITHER HERE OR AT THE NEAREST EMERGENCY DEPARTMENT, IF ANY OF THE FOLLOWING OCCURS:   Pain gets worse or changes.   The hernia pops out and won't go back in.   Nausea (feeling sick) or vomiting (throwing up).   The hernia gets tender, swollen or discolored (changes color).
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
## 2018-12-24 LAB — URINE CHLAMYDIA/NEISSERIA BY PCR
Chlamydia DNA by PCR: NEGATIVE
Neisseria gonorrhoeae by PCR: NEGATIVE

## 2019-02-18 ENCOUNTER — Emergency Department: Payer: Medicaid Other

## 2019-02-18 ENCOUNTER — Emergency Department
Admission: EM | Admit: 2019-02-18 | Discharge: 2019-02-18 | Discharge status: Against Medical Advice | Payer: Medicaid Other | Attending: Emergency Medicine | Admitting: Emergency Medicine

## 2019-02-18 DIAGNOSIS — Z885 Allergy status to narcotic agent status: Secondary | ICD-10-CM | POA: Insufficient documentation

## 2019-02-18 DIAGNOSIS — Y92410 Unspecified street and highway as the place of occurrence of the external cause: Secondary | ICD-10-CM | POA: Insufficient documentation

## 2019-02-18 DIAGNOSIS — F17210 Nicotine dependence, cigarettes, uncomplicated: Secondary | ICD-10-CM | POA: Insufficient documentation

## 2019-02-18 DIAGNOSIS — I10 Essential (primary) hypertension: Secondary | ICD-10-CM | POA: Insufficient documentation

## 2019-02-18 DIAGNOSIS — M5441 Lumbago with sciatica, right side: Secondary | ICD-10-CM | POA: Insufficient documentation

## 2019-02-18 HISTORY — DX: Malignant (primary) neoplasm, unspecified: C80.1

## 2019-02-18 HISTORY — DX: Herpesviral infection, unspecified: B00.9

## 2019-02-18 LAB — URINALYSIS REFLEX TO MICROSCOPIC EXAM - REFLEX TO CULTURE
Bilirubin, UA: NEGATIVE
Blood, UA: NEGATIVE
Glucose, UA: NEGATIVE
Ketones UA: NEGATIVE
Leukocyte Esterase, UA: NEGATIVE
Nitrite, UA: NEGATIVE
Protein, UR: NEGATIVE
Specific Gravity UA: 1.029 (ref 1.001–1.035)
Urine pH: 6 (ref 5.0–8.0)
Urobilinogen, UA: 0.2 mg/dL (ref 0.2–2.0)

## 2019-02-18 LAB — URINE HCG QUALITATIVE: Urine HCG Qualitative: NEGATIVE

## 2019-02-18 MED ORDER — LIDOCAINE 5 % EX PTCH
1.00 | MEDICATED_PATCH | CUTANEOUS | Status: DC
Start: 2019-02-18 — End: 2019-02-18
  Filled 2019-02-18: qty 1

## 2019-02-18 MED ORDER — CYCLOBENZAPRINE HCL 10 MG PO TABS
10.0000 mg | ORAL_TABLET | Freq: Once | ORAL | Status: DC
Start: 2019-02-18 — End: 2019-02-18
  Filled 2019-02-18: qty 1

## 2019-02-18 MED ORDER — HYDROMORPHONE HCL 2 MG PO TABS
2.0000 mg | ORAL_TABLET | Freq: Once | ORAL | Status: DC
Start: 2019-02-18 — End: 2019-02-18
  Filled 2019-02-18: qty 1

## 2019-02-18 MED ORDER — ONDANSETRON 4 MG PO TBDP
4.00 mg | ORAL_TABLET | Freq: Once | ORAL | Status: DC
Start: 2019-02-18 — End: 2019-02-18
  Filled 2019-02-18: qty 1

## 2019-02-18 NOTE — EDIE (Signed)
COLLECTIVE?NOTIFICATION?02/18/2019 19:58?Catherine Spence, Catherine Spence?MRN: [id number]    Criteria Met      3 Different Facilities in 90 Days    5 ED Visits in 12 Months    Security and Safety  No recent Security Events currently on file    ED Care Guidelines  There are currently no ED Care Guidelines for this patient. Please check your facility's medical records system.        Prescription Monitoring Program  000??- Narcotic Use Score  000??- Sedative Use Score  000??- Stimulant Use Score  000??- Overdose Risk Score  - All Scores range from 000-999 with 75% of the population scoring < 200 and on 1% scoring above 650  - The last digit of the narcotic, sedative, and stimulant score indicates the number of active prescriptions of that type  - Higher Use scores correlate with increased prescribers, pharmacies, mg equiv, and overlapping prescriptions  - Higher Overdose Risk Scores correlate with increased risk of unintentional overdose death   Concerning or unexpectedly high scores should prompt a review of the PMP record; this does not constitute checking PMP for prescribing purposes.      [name] Visit Count (12 mo.)  Facility Visits   [facility] 3   [facility] [place] 2   [facility] 1   Total 6   Note: Visits indicate total known visits.      Recent Emergency Department Visit Summary  Date Facility [facility] Type Diagnoses or Chief Complaint   Feb 18, 2019 [facility] Emergency Room: [facility] [place] [facility]      lower back pain      Nov 24, 2018 [place] [facility] [place] [place] [facility]      hernia      hernia /cant breath - move      Abdominal Pain      Umbilical hernia without obstruction or gangrene      Nov 21, 2018 [facility] [place] [facility]      Unspecified abdominal hernia without obstruction or gangrene      Nicotine dependence, unspecified, uncomplicated      Aug 21, 2018 [place] [facility] [place] [place] [facility]      hernia, passed out as store 30 minnutes ago, dizzy, adb  pain      Hernia      Nausea      Syncope      Abdominal Pain      Emesis      Umbilical hernia without obstruction or gangrene      Syncope and collapse      Unspecified ovarian cyst, right side      Jun 11, 2018 [place] [facility] [place] [place] [place] [facility]      lung issues      Chest Pain      Back Pain      Gastro-esophageal reflux disease without esophagitis      Other chest pain      Apr 25, 2018 [facility]: [place] [place] [facility]      ?flu, chest tightness      Cough      Other general symptoms and signs          Recent Inpatient Visit Summary  No recorded inpatient visits.     Care Team  There is not a care team on record at this time.   Collective Portal  This patient has registered at the [facility]  Room: [facility] Emergency Department   For more information visit: [email or web address]     PLEASE NOTE:     1.   Any care recommendations and other clinical information are provided as guidelines or for historical purposes only, and providers should exercise their own clinical judgment when providing care.    2.   You may only use this information for purposes of treatment, payment or health care operations activities, and subject to the limitations of applicable Collective Policies.    3.   You should consult directly with the organization that provided a care guideline or other clinical history with any questions about additional information or accuracy or completeness of information provided.    ? 2020 [place] - [email or web address]

## 2019-02-18 NOTE — ED Notes (Signed)
Pt was taken to CT scan after her pregnancy test came back negative.  Pt demanded a blood pregnancy test and refused the CT scan without it.  It was explained to patient that the urine test was just as accurate.  Pt stated she was not stupid or ignorant.  Pt then stated she wanted to leave.  She was given an AMA form to sign and left with no notable difficulty walking.  Pt left prior to getting medications
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
## 2019-02-18 NOTE — Discharge Instructions (Signed)
Back Pain NOS    You have been seen for back pain.    Back pain can happen anywhere from the neck down to the low back. Back pain has many different causes. Some of the more common are: Bone pain, muscle strain, muscle spasm, pain from overuse, and pinched nerves. Other problems can cause what feels like back pain. But the pain is really coming from another organ. A kidney infection can cause lower back pain.    Your doctor did not find any pain over the bones in your back (even though you might have pain in the muscles of the back). This means it is very unlikely that you have a broken bone (fracture) in your back. Your doctor did not think it was necessary to take an x-ray.    The doctor still does not know the exact cause of your pain. Your problem does not seem to be from a dangerous cause. It is OK for you to go home today.    Some things you can try to help your back feel better are:   Apply a warm damp washcloth to the back where you have pain for 20 minutes at a time, at least 4 times per day.   Have someone massage the sore parts of your back.   Don't do any heavy lifting or bending. You can go back to normal daily activities if they don't make the pain worse.   You can use anti-inflammatory pain medicine for your pain. This could be Ibuprofen (Advil or Motrin). You can buy these at most stores. Follow the directions on the package.    This pain may last for the next few days.     Call your doctor or go to the nearest Emergency Department if you your pain does not improve within 4 weeks or your pain is bad enough to seriously limit your normal activities.    YOU SHOULD SEEK MEDICAL ATTENTION IMMEDIATELY, EITHER HERE OR AT THE NEAREST EMERGENCY DEPARTMENT, IF ANY OF THE FOLLOWING OCCURS:   You think the pain is coming from somewhere other than your back. This can include chest pain. This is sometimes from angina (heart pains) or other dangerous causes.   You have shortness of breath,  sweating, chest pain (or pressure, heaviness, indigestion, etc).   You have abdominal (belly) pain that goes through to your back.   Your arms and legs tingle or get numb (lose feeling).   Your arms or legs are weak.   You lose control of your bladder or bowels. If this were to happen, it may cause you to wet or soil yourself.   You have problems urinating (peeing).   You have fever (temperature higher than 100.4F / 38C).   Your pain gets worse.   Your symptoms get worse or you have new symptoms or concerns.    If you can't follow up with your doctor, or if at any time you feel you need to be rechecked or seen again, come back here or go to the nearest emergency department.             Sedating Medication (Edu)    You have been given a medicine or prescription for medicine that causes drowsiness (sleepiness) or dizziness.    While taking this medicine:   Do not drink alcohol or take any take any tranquilizers or sleeping pills.   Do not drive or use heavy machinery.    Do not ride a bicycle.   Do   not perform jobs where you need to be alert.   Do not make important decisions or sign any legal documents.    After taking sedating medicine, you have a bigger risk of falling. You may need help with things you don't normally need help with. Falls can result in serious injury.     Do the following when taking this medicine to prevent a fall:   If you get dizzy, sit or lie down at the first signs. Get up slowly.   Be careful walking and going up and down stairs. Always ask for help.   Wear your eye glasses.    Use your assistive devices such as a cane or walker.     Never give this medicine to others.    Keep this medicine out of reach of children.    Do not take or save old medicines. Throw them away when expired. To learn how to safely throw away medications, go to:  https://www.fda.gov/Drugs/ResourcesForYou/Consumers/BuyingUsingMedicineSafely/EnsuringSafeUseofMedicine/SafeDisposalofMedicines/default.htm    Keep all medicines in a cool, dry place. Do not keep them in your bathroom medicine cabinet or in a cabinet above the stove.

## 2019-04-02 IMAGING — DX DG CHEST 1V PORT
1 series · 1 of 1 positions shown · non-contrast
Comparison: No prior.

CLINICAL DATA: Vomiting.  Weakness.  Dizziness.

EXAM:
PORTABLE CHEST 1 VIEW

[chest ap]
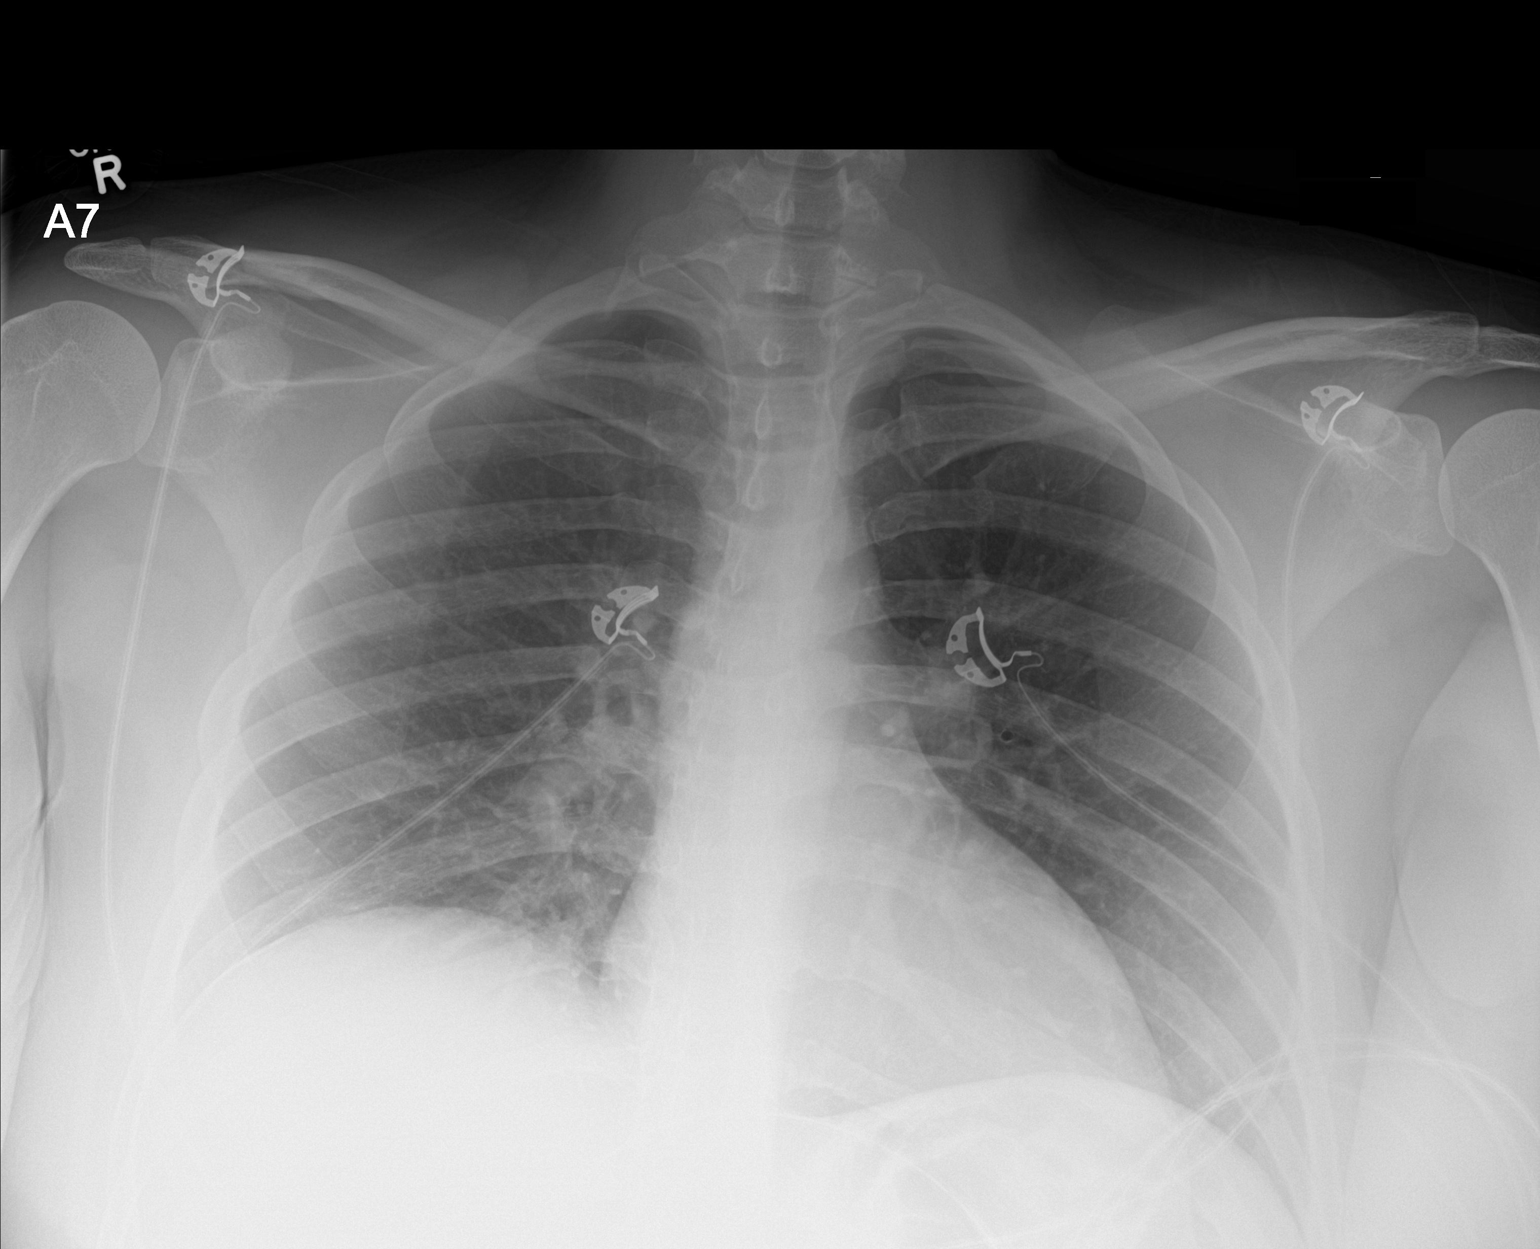

[1 of 1 positions shown; findings below may reference images not displayed]

FINDINGS: Mediastinum hilar structures normal. Heart size normal. Mild right
base atelectasis/infiltrate. No pleural effusion pneumothorax.
IMPRESSION: Mild right base atelectasis/infiltrate.

## 2019-10-16 ENCOUNTER — Emergency Department: Payer: Medicaid Other

## 2019-10-16 ENCOUNTER — Emergency Department
Admission: EM | Admit: 2019-10-16 | Discharge: 2019-10-16 | Disposition: A | Discharge status: Home / Self Care | Payer: Medicaid Other | Attending: Emergency Medicine | Admitting: Emergency Medicine

## 2019-10-16 DIAGNOSIS — R55 Syncope and collapse: Secondary | ICD-10-CM | POA: Insufficient documentation

## 2019-10-16 DIAGNOSIS — K219 Gastro-esophageal reflux disease without esophagitis: Secondary | ICD-10-CM | POA: Insufficient documentation

## 2019-10-16 DIAGNOSIS — M546 Pain in thoracic spine: Secondary | ICD-10-CM | POA: Insufficient documentation

## 2019-10-16 DIAGNOSIS — R079 Chest pain, unspecified: Secondary | ICD-10-CM | POA: Insufficient documentation

## 2019-10-16 DIAGNOSIS — R112 Nausea with vomiting, unspecified: Secondary | ICD-10-CM | POA: Insufficient documentation

## 2019-10-16 DIAGNOSIS — N83201 Unspecified ovarian cyst, right side: Secondary | ICD-10-CM | POA: Insufficient documentation

## 2019-10-16 DIAGNOSIS — R1111 Vomiting without nausea: Secondary | ICD-10-CM

## 2019-10-16 DIAGNOSIS — I10 Essential (primary) hypertension: Secondary | ICD-10-CM | POA: Insufficient documentation

## 2019-10-16 LAB — COMPREHENSIVE METABOLIC PANEL
ALT: 20 U/L (ref 0–55)
AST (SGOT): 15 U/L (ref 5–34)
Albumin/Globulin Ratio: 1.3 (ref 0.9–2.2)
Albumin: 3.7 g/dL (ref 3.5–5.0)
Alkaline Phosphatase: 68 U/L (ref 37–106)
Anion Gap: 4 — ABNORMAL LOW (ref 5.0–15.0)
BUN: 15 mg/dL (ref 7.0–19.0)
Bilirubin, Total: 0.3 mg/dL (ref 0.2–1.2)
CO2: 26 mEq/L (ref 22–29)
Calcium: 8.5 mg/dL (ref 8.5–10.5)
Chloride: 110 mEq/L (ref 100–111)
Creatinine: 0.8 mg/dL (ref 0.6–1.0)
Globulin: 2.8 g/dL (ref 2.0–3.6)
Glucose: 91 mg/dL (ref 70–100)
Potassium: 4.5 mEq/L (ref 3.5–5.1)
Protein, Total: 6.5 g/dL (ref 6.0–8.3)
Sodium: 140 mEq/L (ref 136–145)

## 2019-10-16 LAB — CBC AND DIFFERENTIAL
Absolute NRBC: 0 10*3/uL (ref 0.00–0.00)
Basophils Absolute Automated: 0.03 10*3/uL (ref 0.00–0.08)
Basophils Automated: 0.3 %
Eosinophils Absolute Automated: 0.22 10*3/uL (ref 0.00–0.44)
Eosinophils Automated: 2.5 %
Hematocrit: 38.7 % (ref 34.7–43.7)
Hgb: 12.5 g/dL (ref 11.4–14.8)
Immature Granulocytes Absolute: 0.02 10*3/uL (ref 0.00–0.07)
Immature Granulocytes: 0.2 %
Lymphocytes Absolute Automated: 2.9 10*3/uL (ref 0.42–3.22)
Lymphocytes Automated: 33.5 %
MCH: 26.5 pg (ref 25.1–33.5)
MCHC: 32.3 g/dL (ref 31.5–35.8)
MCV: 82 fL (ref 78.0–96.0)
MPV: 9.9 fL (ref 8.9–12.5)
Monocytes Absolute Automated: 0.5 10*3/uL (ref 0.21–0.85)
Monocytes: 5.8 %
Neutrophils Absolute: 4.99 10*3/uL (ref 1.10–6.33)
Neutrophils: 57.7 %
Nucleated RBC: 0 /100 WBC (ref 0.0–0.0)
Platelets: 253 10*3/uL (ref 142–346)
RBC: 4.72 10*6/uL (ref 3.90–5.10)
RDW: 13 % (ref 11–15)
WBC: 8.66 10*3/uL (ref 3.10–9.50)

## 2019-10-16 LAB — URINALYSIS REFLEX TO MICROSCOPIC EXAM - REFLEX TO CULTURE
Bilirubin, UA: NEGATIVE
Blood, UA: NEGATIVE
Glucose, UA: NEGATIVE
Ketones UA: NEGATIVE
Leukocyte Esterase, UA: NEGATIVE
Nitrite, UA: NEGATIVE
Protein, UR: NEGATIVE
Specific Gravity UA: 1.026 (ref 1.001–1.035)
Urine pH: 6 (ref 5.0–8.0)
Urobilinogen, UA: 0.2 mg/dL (ref 0.2–2.0)

## 2019-10-16 LAB — HCG, SERUM, QUALITATIVE: Hcg Qualitative: NEGATIVE

## 2019-10-16 LAB — LIPASE: Lipase: 33 U/L (ref 8–78)

## 2019-10-16 LAB — GFR: EGFR: >60

## 2019-10-16 MED ORDER — IOHEXOL 350 MG/ML IV SOLN
100.00 mL | Freq: Once | INTRAVENOUS | Status: AC | PRN
Start: 2019-10-16 — End: 2019-10-16
  Administered 2019-10-16: 100 mL via INTRAVENOUS

## 2019-10-16 MED ORDER — ONDANSETRON HCL 4 MG/2ML IJ SOLN
4.00 mg | Freq: Once | INTRAMUSCULAR | Status: AC
Start: 2019-10-16 — End: 2019-10-16
  Administered 2019-10-16: 4 mg via INTRAVENOUS
  Filled 2019-10-16: qty 2

## 2019-10-16 MED ORDER — SODIUM CHLORIDE 0.9 % IV BOLUS
1000.00 mL | Freq: Once | INTRAVENOUS | Status: AC
Start: 2019-10-16 — End: 2019-10-16
  Administered 2019-10-16: 1000 mL via INTRAVENOUS

## 2019-10-16 MED ORDER — KETOROLAC TROMETHAMINE 15 MG/ML IJ SOLN
15.00 mg | Freq: Once | INTRAMUSCULAR | Status: AC
Start: 2019-10-16 — End: 2019-10-16
  Administered 2019-10-16: 15 mg via INTRAVENOUS
  Filled 2019-10-16: qty 1
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
## 2019-10-16 NOTE — Discharge Instructions (Signed)
You have been seen and evaluated due to almost passing out vomiting chest pain back pain.  At this time the cause of your back pain and use of albuterol and Advair is unclear.  There is no evidence of pneumonia there is no evidence of injury to your large blood vessels in your heart or abdomen.  You do need to follow-up with your primary care doctor and also see the pulmonologist AKA lung doctor as scheduled.  Continue to follow-up with surgery for hernia repair.  Your CT scan did show that you are constipated.  You should start taking daily MiraLAX.  At night you should take Colace which is a stool softener.  You should also increase your water intake because that is a common cause of constipation.  Please return to the emergency department if you have any new or worsening concerns or symptoms.    Constipation    You have been diagnosed with constipation.    Constipation is one of the most common problems people have. It is especially common for those over age 64.    Constipation is caused by a few things. This includes not enough fiber in the diet. It also includes not enough regular exercise. Hypothyroidism is another possible cause. Medicines (especially narcotic pain medicines like oxycodone (Percocet), hydrocodone (Vicodin), propoxyphene (Darvocet), and codeine (Tylenol #3) can cause it. Rarely, constipation can be a symptom in colon cancer. You should follow up with your doctor.    Typical constipation symptoms are less bowel movements than usual and small hard stool. Sometimes, diarrhea happens when stool leaks over and around a blockage of hard stool. This is called "overflow incontinence."    There are many treatments for constipation. These include having more fiber in your diet. Fruits, vegetables and foods with bran are rich in fiber. One cup of bran cereal a day usually gets you the daily dietary fiber and fluids you need. A teaspoon (5 ml) of Metamucil with each meal usually works as  well.    You may also need suppositories, enemas and/or pills for your constipation. Use these only after talking about them with your doctor.    YOU SHOULD SEEK MEDICAL ATTENTION IMMEDIATELY, EITHER HERE OR AT THE NEAREST EMERGENCY DEPARTMENT, IF ANY OF THE FOLLOWING OCCURS:   Vomiting.   Fever (temperature higher than 100.3F / 38C).   Bloody or black, tarry stool.   Abdominal (belly) pain that is new, worse or that does not get better in the next 24 hours.   Cannot have a bowel movement even with laxatives, enemas and/or suppositories used as directed.   Worsening in any way or any concerns.

## 2019-10-16 NOTE — ED Notes (Signed)
Orders received from provider, when this RN entered room to complete orders pt states "good I was just about to say if they dont get in here quick to draw my blood and do an [place] or something I am going to flip out", pt then states "its good you guys got that thing around the TV there because when I get mad I throw things".

## 2019-10-16 NOTE — ED Triage Notes (Signed)
Pt c/o sudden onset of nausea and vomiting x1, pt states she was putting her groceries aware and suddenly became nauseous, reports she does have an umbilical hernia that is irritated when she vomited. Reports diarrhea this morning but states she also takes laxatives. Denies pain. Denies dizziness.

## 2019-10-28 ENCOUNTER — Encounter: Payer: Self-pay | Admitting: Surgery

## 2019-11-06 ENCOUNTER — Telehealth: Payer: Self-pay

## 2019-11-06 ENCOUNTER — Emergency Department
Admission: EM | Admit: 2019-11-06 | Discharge: 2019-11-06 | Disposition: A | Discharge status: Home / Self Care | Payer: Medicaid Other | Attending: Emergency Medicine | Admitting: Emergency Medicine

## 2019-11-06 DIAGNOSIS — R0789 Other chest pain: Secondary | ICD-10-CM | POA: Insufficient documentation

## 2019-11-06 DIAGNOSIS — R079 Chest pain, unspecified: Secondary | ICD-10-CM

## 2019-11-06 DIAGNOSIS — I10 Essential (primary) hypertension: Secondary | ICD-10-CM | POA: Insufficient documentation

## 2019-11-06 DIAGNOSIS — T7840XA Allergy, unspecified, initial encounter: Secondary | ICD-10-CM

## 2019-11-06 DIAGNOSIS — X58XXXA Exposure to other specified factors, initial encounter: Secondary | ICD-10-CM | POA: Insufficient documentation

## 2019-11-06 DIAGNOSIS — R030 Elevated blood-pressure reading, without diagnosis of hypertension: Secondary | ICD-10-CM

## 2019-11-06 DIAGNOSIS — Z7951 Long term (current) use of inhaled steroids: Secondary | ICD-10-CM | POA: Insufficient documentation

## 2019-11-06 MED ORDER — DIPHENHYDRAMINE HCL 50 MG/ML IJ SOLN
50.00 mg | Freq: Once | INTRAMUSCULAR | Status: AC
Start: 2019-11-06 — End: 2019-11-06
  Administered 2019-11-06: 50 mg via INTRAVENOUS
  Filled 2019-11-06: qty 1

## 2019-11-06 MED ORDER — EPINEPHRINE 0.3 MG/0.3ML IJ SOAJ
0.3000 mg | INTRAMUSCULAR | 0 refills | Status: AC | PRN
Start: 2019-11-06 — End: ?

## 2019-11-06 MED ORDER — EPINEPHRINE HCL 1 MG/ML ADULT ANAPHYLAXIS KIT
0.3000 mg | Freq: Once | INTRAMUSCULAR | Status: DC
Start: 2019-11-06 — End: 2019-11-06
  Filled 2019-11-06: qty 1

## 2019-11-06 MED ORDER — METHYLPREDNISOLONE SODIUM SUCC 125 MG IJ SOLR
125.00 mg | Freq: Once | INTRAMUSCULAR | Status: AC
Start: 2019-11-06 — End: 2019-11-06
  Administered 2019-11-06: 125 mg via INTRAVENOUS
  Filled 2019-11-06: qty 2

## 2019-11-06 MED ORDER — FAMOTIDINE 10 MG/ML IV SOLN (WRAP)
20.00 mg | Freq: Once | INTRAVENOUS | Status: AC
Start: 2019-11-06 — End: 2019-11-06
  Administered 2019-11-06: 20 mg via INTRAVENOUS
  Filled 2019-11-06: qty 2

## 2019-11-06 MED ORDER — PREDNISONE 20 MG PO TABS
60.00 mg | ORAL_TABLET | Freq: Every day | ORAL | 0 refills | Status: AC
Start: 2019-11-06 — End: 2019-11-09

## 2019-11-06 MED ORDER — FAMOTIDINE 20 MG PO TABS
20.0000 mg | ORAL_TABLET | Freq: Two times a day (BID) | ORAL | 0 refills | Status: AC
Start: 2019-11-06 — End: 2019-11-11

## 2019-11-06 MED ORDER — DIPHENHYDRAMINE HCL 25 MG PO TABS
50.0000 mg | ORAL_TABLET | Freq: Three times a day (TID) | ORAL | 0 refills | Status: DC | PRN
Start: 2019-11-06 — End: 2020-10-26

## 2019-11-06 NOTE — Discharge Instructions (Signed)
Return to ED for nausea, vomiting, fevers, chills, chest pain, shortness of breath, abdominal pain, headaches, neck pain, back pain, weakness, numbness, worsening pain, worsening symptoms, unable to follow up with PMD/Specialist as directed

## 2019-11-06 NOTE — ED Notes (Signed)
Pt declines epi at this time.  Pt states "I don't want you to kill me."  Pt states concern that epi will "stop my heart."  Pt explained the use of epi and indications for its use.  Pt states she "understands but at [facility] they would only give me epi if I needed it."  Pt states that she "usually gets benadryl and other meds and they wait and see what happens before they give me epi."
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
## 2019-11-06 NOTE — ED Triage Notes (Signed)
Pt arrives to ED through triage c/o allergic reaction.  Pt states she took zyrtec-D because she was feeling congested.  Pt states she has not been feeling well for the last five days.  Pt states she recently started taking azithromax and that she had a similar reaction when she took claritan-d.

## 2019-11-11 ENCOUNTER — Ambulatory Visit: Payer: Self-pay

## 2019-11-19 ENCOUNTER — Encounter: Payer: Self-pay | Admitting: Surgery

## 2019-12-11 ENCOUNTER — Encounter: Payer: Self-pay | Admitting: Surgery

## 2019-12-11 ENCOUNTER — Telehealth: Payer: Self-pay

## 2019-12-11 NOTE — Pre-Procedure Instructions (Signed)
SPECIAL INSTRUCTIONS FOR THE PSS NAVIGATORS:  CONTACT ISOLATION:     TRANSLATOR REQUESTED:    SPECIAL INSTRUCTIONS:    TRANSPORTATION: HUSBAND WILL COME IN WITH PT        TESTING - DAY OF SURGERY: HCG, FSBS FOR BMI 45.1   PREGNANCY TEST WAIVERS:    ABNORMAL LABS:        SPECIAL CONSIDERATIONS-BLOOD PRODUCTS:    SPECIAL NEEDS FOR PATIENT - [facility] ED 10/16/19- CHEST PAIN, BACK PAIN, NEAR SYCOPE, BP 151/104. [facility] ED 11/06/19- ALLERGIC REACTION/ ANAPHYLAXIS, CHEST PAIN, BP 134/105. ADVISED AT SECOND ED VISIT TO FU WITH PCP FOR CHEST PAIN, HTN, DID NOT.  MED CL REQUESTED. TREATED IN \\July  FOR URI WITH ZITHROMAX- NOTE, PFT REQUESTED FROM [facility]        PLEASE DO NOT REPLY TO THIS EMAIL. IF YOU HAVE ANY QUESTIONS, PLEASE CONTACT THE PREOP INTERVIEW OFFICE AT [phone] OR [phone]   MONDAY - FRIDAY.      Date of Procedure: 12/16/19    Arrival Time: 10:00 AM     Procedure Time: 12 NOON     Someone from the hospital will call you after 4pm the business day prior to your scheduled procedure to verify the arrival and procedure times listed above. The purpose of this call is to advise you of any scheduling changes that may have occurred since the time of your telephone interview with the pre surgical services nurse.    NOTE FROM THE ANESTHESIA DEPARTMENT:  Please note that your procedure may be cancelled or rescheduled if you do not complete the required testing, preop clearances and/or follow the medication instructions that are required by your surgeon and /or the anesthesia department.    Requirements marked with an "X" below (i.e.: testing, etc.) Must be completed prior to your procedure :    ( X ) MEDICAL CLEARANCE:  You will need to obtain a medical clearance prior to your procedure date. Please contact your primary care physician's office as soon as possible to schedule a clearance appointment.  I HAVE FAXED REQUEST TO [facility].    ( X ) Eating: You should not have anything to eat after  4:00  AM 12/16/19.    ( X ) Drinking: Drink "clear" liquids until  8:00 AM 12/16/19 .    CLEAR LIQUIDS INCLUDE: WATER, GATORADE, JUICES WITHOUT PULP (APPLE, CRANBERRY, GRAPE), CARBONATED BEVERAGES, BLACK COFFEE OR  BLACK TEA (WITHOUT MILK, CREAM OR NON-DAIRY CREAMER). SUGAR OR SWEETENER MAY BE ADDED.       Medication Instructions:  BENADRYL  TAKE PM BEFORE SURGERY AND TAKE ON REGULAR SCHEDULE UNTIL THEN.     ADVAIR  TAKE  AM OF SURGERY AND ON REGULAR SCHEDULE UNTIL DAY OF SURGERY.    ALBUTEROL (VENTOLIN) INHALER  USE AM OF SURGERY IF NEEDED, BRING TO HOSPITAL    STOP HERBAL AND GREEN TEA NOW    YOU SHOULD NOT TAKE IBUPROFEN, MOTRIN, ADVIL, ALEVE, ASPIRIN, NSAIDS AS OF NOW .     DOES NOT TAKE TYLENOL.    Hospital Address and Arrival Instructions:   [address] [place], [place] [address]  [facility] is where you will enter.  Please call [phone] morning of surgery if you need assistance upon arrival.    Upon arrival in the hospital main lobby via [employer], you will proceed to:  - Patient Registration, which is all the way down the hall on the left.  - Once registration is completed,  you will be escorted by the registration staff to the Surgical Services Waiting Area, where you wait until a PreOp clinical team member escorts you to a room and initiates the PreOp process.  - You will need to have a valid photo I.D., insurance card and co-pay form of payment if required by insurance.    Pre Surgical General Instructions:  1. Bathe or shower the morning of the procedure with an anti-bacterial soap before arriving (unless instructed to use Chlorhexidine (CHG) or Hibiclens).   2. Do not apply lotion, perfume, cologne, or hair-care products such as hair spray or gels.  3. Do not shave your surgical site at home.  4. Do not wear makeup, jewelry including body piercing, watches, earrings, or rings.   5. You may brush your teeth and gargle on the morning of surgery but do not swallow any water.  6.  Wear casual, loose fitting and comfortable clothing. A gown will be provided.  7. Plan to leave unnecessary valuables, credit cards (except for co-pay payment use) and jewelry at home or with a companion on day of surgery for safe keeping. The hospital is not responsible for lost/stolen items.  8. If you wear contacts please leave them at home. If you wear glasses, please bring a case.  9. Dentures - we will provide a container  10. Hearing aids, you may bring dos but you will be asked to remove them before surgery.  11. Please arrange for someone to drive you home. For your safety you will not be allowed to drive home after sedation or anesthesia. A responsible adult must be present to accompany you home when you are ready to leave. We strongly recommend that all patients have an adult at home with them for the first 24 hours after surgery.  12. Notify your doctor if you develop any sign of illness before the date of your surgery. Report symptoms such as: high fever, sore throat, or other infection, breathing difficulties or chest pain.  13. Discontinue herbal supplements and herbal/green tea one week prior to surgery.    Below is a link/web address to the Preparing for Your Procedure video that walks you through the surgical experience.   [email or web address]    Possible Anesthesia Side Effects:  . Nausea and vomiting. This common side effect usually occurs immediately after the procedure, but some people may continue to feel sick for a day or two. Anti-nausea medicines can help.  . Dry mouth. You may feel parched when you wake up. As long as you're not too nauseated, sipping water can help take care of your dry mouth.  . Sore throat or hoarseness. The tube put in your throat to help you breathe during surgery can leave you with a sore throat after it's removed.  . Chills and shivering. It's common for your body temperature to drop during general anesthesia. Your doctors and nurses will make  sure your temperature doesn't fall too much during surgery, but you may wake up shivering and feeling cold. Your chills may last for a few minutes to hours.  . Confusion and fuzzy thinking. When first waking from anesthesia, you may feel confused, drowsy, and foggy. This usually lasts for just a few hours, but for some people -- especially older adults -- confusion can last for days or weeks.  . Muscle aches. The drugs used to relax your muscles during surgery can cause soreness afterward.  . Itching. If narcotic (opioid) medications are used during or after your operation,  you may be itchy. This is a common side effect of this class of drugs.  . Bladder problems. You may have difficulty passing urine for a short time after general anesthesia.  . Dizziness. You may feel dizzy when you first stand up. Drinking plenty of fluids should help you feel better.

## 2019-12-13 NOTE — Anesthesia Preprocedure Evaluation (Addendum)
Anesthesia Evaluation    AIRWAY    Mallampati: I    TM distance: >3 FB  Neck ROM: full  Mouth Opening:full   CARDIOVASCULAR    cardiovascular exam normal       DENTAL    no notable dental hx     PULMONARY    pulmonary exam normal     OTHER FINDINGS                Relevant Problems   PULMONARY   (+) SOB (shortness of breath)               Anesthesia Plan    ASA 3     general                                 informed consent obtained    Plan discussed with CRNA.                 Allergies:  Allergies   Allergen Reactions   . Amoxicillin Anaphylaxis   . Ceclor [Cefaclor] Anaphylaxis and Rash   . Latex Shortness Of Breath and Rash   . Macrodantin Anaphylaxis   . Penicillins Anaphylaxis   . Zyrtec D [Cetirizine-Pseudoephedrine Er] Other (See Comments)     TINGLING NUMB IN THROAT, ITCHING, RASH ON HAND S   . Chocolate Nausea And Vomiting   . Codeine    . Flagyl [Metronidazole] Itching   . Percocet [Oxycodone-Acetaminophen] Nausea And Vomiting   . Sulfa Antibiotics Itching   . Sulfites    . Tylenol [Acetaminophen]      Avoids to liver issues.   . Vicodin [Hydrocodone-Acetaminophen] Hives     Passed out     . Wine [Alcohol]    . Clindamycin Rash     Problem List: GERD, PTSD, PONV, MO- BMI 45    EKG: NSR    Past Surgical History:   Procedure Laterality Date   . LEEP N/A 01/13/2017    Procedure: LEEP;  Surgeon: Rolene Arbour, MD;  Location: [facility];  Service: Gynecology;  Laterality: N/A;  LEEP CONE BX  ASST=Y; EQUIP=N; MD REQ=30MINS; Q1=UNK   . TYMPANOSTOMY TUBE PLACEMENT      as child x2   . VAGINAL DELIVERY  10/04/2016     Labs  Lab Results   Component Value Date    WBC 8.66 10/16/2019    HGB 12.5 10/16/2019    HCT 38.7 10/16/2019    PLT 253 10/16/2019    ALT 20 10/16/2019    AST 15 10/16/2019    NA 140 10/16/2019    K 4.5 10/16/2019    CL 110 10/16/2019    CO2 26 10/16/2019    CREAT 0.8 10/16/2019    BUN 15.0 10/16/2019    TSH 0.90 05/05/2014    PT 12.7 08/25/2008    PTT 28.4 08/25/2008    INR 1.2 08/25/2008     GLU 91 10/16/2019       Signed by: Tessa Lerner 12/13/19 2:48 PM

## 2019-12-16 ENCOUNTER — Telehealth: Payer: Self-pay

## 2019-12-16 NOTE — Pre-Procedure Instructions (Signed)
SPECIAL INSTRUCTIONS FOR THE PSS NAVIGATORS:  CONTACT ISOLATION:     TRANSLATOR REQUESTED:    SPECIAL INSTRUCTIONS:    TRANSPORTATION:  HUSBAND WILL COME IN WITH PT       TESTING - DAY OF SURGERY:  HCG, FSBS FOR BMI 45.1  PREGNANCY TEST WAIVERS:    ABNORMAL LABS:        SPECIAL CONSIDERATIONS-BLOOD PRODUCTS:    SPECIAL NEEDS FOR PATIENT - SURGERY RESCHEDULED FROM 12/11/19 PER SURGEON.     PLEASE DO NOT REPLY TO THIS EMAIL. IF YOU HAVE ANY QUESTIONS, PLEASE CONTACT THE PREOP INTERVIEW OFFICE AT [phone] OR [phone]   MONDAY - FRIDAY.      Date of Procedure: 12/19/19    Arrival Time: 10:30 AM     Procedure Time: 12:30 PM      Someone from the hospital will call you after 4pm the business day prior to your scheduled procedure to verify the arrival and procedure times listed above. The purpose of this call is to advise you of any scheduling changes that may have occurred since the time of your telephone interview with the pre surgical services nurse.    NOTE FROM THE ANESTHESIA DEPARTMENT:  Please note that your procedure may be cancelled or rescheduled if you do not complete the required testing, preop clearances and/or follow the medication instructions that are required by your surgeon and /or the anesthesia department.    Requirements marked with an "X" below (i.e.: testing, etc.) Must be completed prior to your procedure :    ( X ) Eating: You should not have anything to eat after  4:30 AM 12/19/19.    ( X ) Drinking: Drink "clear" liquids until  8:30 AM 12/19/19 .    CLEAR LIQUIDS INCLUDE: WATER, GATORADE, JUICES WITHOUT PULP (APPLE, CRANBERRY, GRAPE), CARBONATED BEVERAGES, BLACK COFFEE OR  BLACK TEA (WITHOUT MILK, CREAM OR NON-DAIRY CREAMER). SUGAR OR SWEETENER MAY BE ADDED.     Medication Instructions:    BENADRYL  TAKE PM BEFORE SURGERY IS NEEDED     DULCOLAX, STOOL SOFTENER WITH LAXATIVE  DO NOT TAKE PM BEFORE  SURGERY. TAKE ON REGULAR SCHEDULE UNTIL THEN.     ADVAIR  TAKE  AM OF SURGERY AND ON REGULAR  SCHEDULE UNTIL DAY OF SURGERY.    VENTOLIN INHALER (ALBUTEROL)  USE AM OF SURGERY IF NEEDED, BRING TO HOSPITAL    STOP HERBAL AND GREEN TEA NOW    YOU SHOULD NOT TAKE IBUPROFEN, MOTRIN, ADVIL, ALEVE, ASPIRIN,  NSAIDS AS OF NOW .     YOU MAY TAKE TYLENOL AS NEEDED.    Hospital Address and Arrival Instructions:   [address], [place], [place] [address]  [facility] is where you will enter.  Please call [phone] morning of surgery if you need assistance upon arrival.    Upon arrival in the hospital main lobby via [employer], you will proceed to:  - Patient Registration, which is all the way down the hall on the left.  - Once registration is completed, you will be escorted by the registration staff to the Surgical Services Waiting Area, where you wait until a PreOp clinical team member escorts you to a room and initiates the PreOp process.  - You will need to have a valid photo I.D., insurance card and co-pay form of payment if required by insurance.    Pre Surgical General Instructions:  1. Bathe or shower the morning of the procedure with an anti-bacterial soap  before arriving (unless instructed to use Chlorhexidine (CHG) or Hibiclens).   2. Do not apply lotion, perfume, cologne, or hair-care products such as hair spray or gels.  3. Do not shave your surgical site at home.  4. Do not wear makeup, jewelry including body piercing, watches, earrings, or rings.   5. You may brush your teeth and gargle on the morning of surgery but do not swallow any water.  6. Wear casual, loose fitting and comfortable clothing. A gown will be provided.  7. Plan to leave unnecessary valuables, credit cards (except for co-pay payment use) and jewelry at home or with a companion on day of surgery for safe keeping. The hospital is not responsible for lost/stolen items.  8. If you wear contacts please leave them at home. If you wear glasses, please bring a case.  9. Dentures - we will provide a container  10.  Hearing aids, you may bring dos but you will be asked to remove them before surgery.  11. Please arrange for someone to drive you home. For your safety you will not be allowed to drive home after sedation or anesthesia. A responsible adult must be present to accompany you home when you are ready to leave. We strongly recommend that all patients have an adult at home with them for the first 24 hours after surgery.  12. Notify your doctor if you develop any sign of illness before the date of your surgery. Report symptoms such as: high fever, sore throat, or other infection, breathing difficulties or chest pain.  13. Discontinue herbal supplements and herbal/green tea one week prior to surgery.    Below is a link/web address to the Preparing for Your Procedure video that walks you through the surgical experience.   [email or web address]    Possible Anesthesia Side Effects:  . Nausea and vomiting. This common side effect usually occurs immediately after the procedure, but some people may continue to feel sick for a day or two. Anti-nausea medicines can help.  . Dry mouth. You may feel parched when you wake up. As long as you're not too nauseated, sipping water can help take care of your dry mouth.  . Sore throat or hoarseness. The tube put in your throat to help you breathe during surgery can leave you with a sore throat after it's removed.  . Chills and shivering. It's common for your body temperature to drop during general anesthesia. Your doctors and nurses will make sure your temperature doesn't fall too much during surgery, but you may wake up shivering and feeling cold. Your chills may last for a few minutes to hours.  . Confusion and fuzzy thinking. When first waking from anesthesia, you may feel confused, drowsy, and foggy. This usually lasts for just a few hours, but for some people -- especially older adults -- confusion can last for days or weeks.  . Muscle aches. The drugs used to relax  your muscles during surgery can cause soreness afterward.  . Itching. If narcotic (opioid) medications are used during or after your operation, you may be itchy. This is a common side effect of this class of drugs.  . Bladder problems. You may have difficulty passing urine for a short time after general anesthesia.  . Dizziness. You may feel dizzy when you first stand up. Drinking plenty of fluids should help you feel better.

## 2019-12-17 ENCOUNTER — Encounter: Payer: Self-pay | Admitting: Surgery

## 2019-12-19 ENCOUNTER — Ambulatory Visit: Payer: Medicaid Other | Admitting: Anesthesiology

## 2019-12-19 ENCOUNTER — Ambulatory Visit
Admission: RE | Admit: 2019-12-19 | Discharge: 2019-12-19 | Disposition: A | Discharge status: Home / Self Care | Payer: Medicaid Other | Source: Ambulatory Visit | Attending: Surgery | Admitting: Surgery

## 2019-12-19 ENCOUNTER — Encounter: Payer: Self-pay | Admitting: Surgery

## 2019-12-19 ENCOUNTER — Ambulatory Visit: Payer: Self-pay

## 2019-12-19 ENCOUNTER — Encounter
Admission: RE | Disposition: A | Discharge status: Home / Self Care | Payer: Self-pay | Source: Ambulatory Visit | Attending: Surgery

## 2019-12-19 DIAGNOSIS — Z6841 Body Mass Index (BMI) 40.0 and over, adult: Secondary | ICD-10-CM | POA: Insufficient documentation

## 2019-12-19 DIAGNOSIS — F17210 Nicotine dependence, cigarettes, uncomplicated: Secondary | ICD-10-CM | POA: Insufficient documentation

## 2019-12-19 DIAGNOSIS — J449 Chronic obstructive pulmonary disease, unspecified: Secondary | ICD-10-CM | POA: Insufficient documentation

## 2019-12-19 DIAGNOSIS — I10 Essential (primary) hypertension: Secondary | ICD-10-CM | POA: Insufficient documentation

## 2019-12-19 DIAGNOSIS — K219 Gastro-esophageal reflux disease without esophagitis: Secondary | ICD-10-CM | POA: Insufficient documentation

## 2019-12-19 DIAGNOSIS — K429 Umbilical hernia without obstruction or gangrene: Secondary | ICD-10-CM | POA: Insufficient documentation

## 2019-12-19 HISTORY — DX: Unspecified disorder of ear, unspecified ear: H93.90

## 2019-12-19 HISTORY — DX: Unspecified disorder of nose and nasal sinuses: J34.9

## 2019-12-19 HISTORY — DX: Other diseases of pharynx: J39.2

## 2019-12-19 HISTORY — DX: Unspecified asthma, uncomplicated: J45.909

## 2019-12-19 HISTORY — DX: Dysphagia, unspecified: R13.10

## 2019-12-19 HISTORY — PX: ROBOT XI ASSISTED,LAPAROSCOPIC,HERNIA REPAIR,UMBILICAL: SHX6029

## 2019-12-19 LAB — GLUCOSE WHOLE BLOOD - POCT: Whole Blood Glucose POCT: 93 mg/dL (ref 70–100)

## 2019-12-19 SURGERY — ROBOT XI ASSISTED, LAPAROSCOPIC, HERNIA REPAIR, UMBILICAL
Anesthesia: Anesthesia General | Site: Abdomen | Wound class: Clean

## 2019-12-19 MED ORDER — PROPOFOL 10 MG/ML IV EMUL (WRAP)
INTRAVENOUS | Status: AC
Start: 2019-12-19 — End: ?
  Filled 2019-12-19: qty 20

## 2019-12-19 MED ORDER — LACTATED RINGERS IV SOLN
INTRAVENOUS | Status: DC
Start: 2019-12-19 — End: 2019-12-19

## 2019-12-19 MED ORDER — BUPIVACAINE HCL 0.5 % IJ SOLN
INTRAMUSCULAR | Status: DC | PRN
Start: 2019-12-19 — End: 2019-12-19
  Administered 2019-12-19: 30 mL

## 2019-12-19 MED ORDER — PROMETHAZINE HCL 25 MG/ML IJ SOLN
6.2500 mg | Freq: Once | INTRAMUSCULAR | Status: DC | PRN
Start: 2019-12-19 — End: 2019-12-19

## 2019-12-19 MED ORDER — ROCURONIUM BROMIDE 50 MG/5ML IV SOLN
INTRAVENOUS | Status: AC
Start: 2019-12-19 — End: ?
  Filled 2019-12-19: qty 5

## 2019-12-19 MED ORDER — FENTANYL CITRATE (PF) 50 MCG/ML IJ SOLN (WRAP)
25.0000 ug | INTRAMUSCULAR | Status: DC | PRN
Start: 2019-12-19 — End: 2019-12-19
  Administered 2019-12-19 (×2): 25 ug via INTRAVENOUS
  Filled 2019-12-19: qty 2

## 2019-12-19 MED ORDER — FENTANYL CITRATE (PF) 50 MCG/ML IJ SOLN (WRAP)
INTRAMUSCULAR | Status: DC | PRN
Start: 2019-12-19 — End: 2019-12-19
  Administered 2019-12-19 (×3): 25 ug via INTRAVENOUS
  Administered 2019-12-19: 50 ug via INTRAVENOUS

## 2019-12-19 MED ORDER — FENTANYL CITRATE (PF) 50 MCG/ML IJ SOLN (WRAP)
INTRAMUSCULAR | Status: AC
Start: 2019-12-19 — End: ?
  Filled 2019-12-19: qty 2

## 2019-12-19 MED ORDER — KETAMINE HCL 100 MG/ML IJ SOLN
INTRAMUSCULAR | Status: AC
Start: 2019-12-19 — End: ?
  Filled 2019-12-19: qty 5

## 2019-12-19 MED ORDER — LEVOFLOXACIN IN D5W 500 MG/100ML IV SOLN
500.0000 mg | INTRAVENOUS | Status: AC
Start: 2019-12-19 — End: 2019-12-19
  Administered 2019-12-19: 500 mg via INTRAVENOUS
  Filled 2019-12-19: qty 100

## 2019-12-19 MED ORDER — KETOROLAC TROMETHAMINE 30 MG/ML IJ SOLN
INTRAMUSCULAR | Status: AC
Start: 2019-12-19 — End: ?
  Filled 2019-12-19: qty 1

## 2019-12-19 MED ORDER — BUPIVACAINE HCL (PF) 0.5 % IJ SOLN
INTRAMUSCULAR | Status: AC
Start: 2019-12-19 — End: ?
  Filled 2019-12-19: qty 60

## 2019-12-19 MED ORDER — SUCCINYLCHOLINE CHLORIDE 20 MG/ML IJ SOLN
INTRAMUSCULAR | Status: DC | PRN
Start: 2019-12-19 — End: 2019-12-19
  Administered 2019-12-19: 200 mg via INTRAVENOUS

## 2019-12-19 MED ORDER — HYDROMORPHONE HCL 1 MG/ML IJ SOLN
INTRAMUSCULAR | Status: DC | PRN
Start: 2019-12-19 — End: 2019-12-19
  Administered 2019-12-19 (×2): .5 mg via INTRAVENOUS

## 2019-12-19 MED ORDER — LIDOCAINE HCL (PF) 2 % IJ SOLN
INTRAMUSCULAR | Status: AC
Start: 2019-12-19 — End: ?
  Filled 2019-12-19: qty 5

## 2019-12-19 MED ORDER — LIDOCAINE HCL 2 % IJ SOLN
INTRAMUSCULAR | Status: DC | PRN
Start: 2019-12-19 — End: 2019-12-19
  Administered 2019-12-19: 100 mg via INTRAVENOUS

## 2019-12-19 MED ORDER — SUCCINYLCHOLINE CHLORIDE 20 MG/ML IJ SOLN
INTRAMUSCULAR | Status: AC
Start: 2019-12-19 — End: ?
  Filled 2019-12-19: qty 10

## 2019-12-19 MED ORDER — NEOSTIGMINE METHYLSULFATE 1 MG/ML IJ/IV SOLN (WRAP)
Status: DC | PRN
Start: 2019-12-19 — End: 2019-12-19
  Administered 2019-12-19: 4 mg via INTRAVENOUS

## 2019-12-19 MED ORDER — PHENYLEPHRINE HCL 10 MG/ML IV SOLN (WRAP)
Status: AC
Start: 2019-12-19 — End: ?
  Filled 2019-12-19: qty 1

## 2019-12-19 MED ORDER — MIDAZOLAM HCL 1 MG/ML IJ SOLN (WRAP)
INTRAMUSCULAR | Status: AC
Start: 2019-12-19 — End: ?
  Filled 2019-12-19: qty 2

## 2019-12-19 MED ORDER — ONDANSETRON HCL 4 MG/2ML IJ SOLN
INTRAMUSCULAR | Status: DC | PRN
Start: 2019-12-19 — End: 2019-12-19
  Administered 2019-12-19: 4 mg via INTRAVENOUS

## 2019-12-19 MED ORDER — TRAMADOL HCL 50 MG PO TABS
50.0000 mg | ORAL_TABLET | Freq: Four times a day (QID) | ORAL | Status: DC | PRN
Start: 2019-12-19 — End: 2019-12-19

## 2019-12-19 MED ORDER — LEVOFLOXACIN IN D5W 500 MG/100ML IV SOLN
500.0000 mg | INTRAVENOUS | Status: DC
Start: 2019-12-19 — End: 2019-12-19

## 2019-12-19 MED ORDER — STERILE WATER FOR IRRIGATION IR SOLN
Status: DC | PRN
Start: 2019-12-19 — End: 2019-12-19
  Administered 2019-12-19: 700 mL

## 2019-12-19 MED ORDER — PROPOFOL 10 MG/ML IV EMUL (WRAP)
INTRAVENOUS | Status: DC | PRN
Start: 2019-12-19 — End: 2019-12-19
  Administered 2019-12-19: 200 mg via INTRAVENOUS

## 2019-12-19 MED ORDER — HYDROMORPHONE HCL 0.5 MG/0.5 ML IJ SOLN
0.5000 mg | INTRAMUSCULAR | Status: DC | PRN
Start: 2019-12-19 — End: 2019-12-19

## 2019-12-19 MED ORDER — NEOSTIGMINE METHYLSULFATE 1 MG/ML IJ/IV SOLN (WRAP)
Status: AC
Start: 2019-12-19 — End: ?
  Filled 2019-12-19: qty 10

## 2019-12-19 MED ORDER — ROCURONIUM BROMIDE 50 MG/5ML IV SOLN
INTRAVENOUS | Status: DC | PRN
Start: 2019-12-19 — End: 2019-12-19
  Administered 2019-12-19: 10 mg via INTRAVENOUS
  Administered 2019-12-19: 50 mg via INTRAVENOUS

## 2019-12-19 MED ORDER — PROMETHAZINE HCL 25 MG/ML IJ SOLN
INTRAMUSCULAR | Status: DC | PRN
Start: 2019-12-19 — End: 2019-12-19
  Administered 2019-12-19: 5 mg via INTRAVENOUS

## 2019-12-19 MED ORDER — ONDANSETRON HCL 4 MG/2ML IJ SOLN
4.0000 mg | Freq: Once | INTRAMUSCULAR | Status: DC | PRN
Start: 2019-12-19 — End: 2019-12-19

## 2019-12-19 MED ORDER — TRAMADOL HCL 50 MG PO TABS
ORAL_TABLET | ORAL | Status: AC
Start: 2019-12-19 — End: 2019-12-19
  Administered 2019-12-19: 50 mg via ORAL
  Filled 2019-12-19: qty 1

## 2019-12-19 MED ORDER — KETAMINE HCL 50 MG/ML IJ SOLN
INTRAMUSCULAR | Status: DC | PRN
Start: 2019-12-19 — End: 2019-12-19
  Administered 2019-12-19: 30 mg via INTRAVENOUS

## 2019-12-19 MED ORDER — ONDANSETRON HCL 4 MG/2ML IJ SOLN
INTRAMUSCULAR | Status: AC
Start: 2019-12-19 — End: ?
  Filled 2019-12-19: qty 2

## 2019-12-19 MED ORDER — BUPIVACAINE HCL (PF) 0.25 % IJ SOLN
INTRAMUSCULAR | Status: AC
Start: 2019-12-19 — End: ?
  Filled 2019-12-19: qty 30

## 2019-12-19 MED ORDER — GLYCOPYRROLATE 0.2 MG/ML IJ SOLN (WRAP)
INTRAMUSCULAR | Status: DC | PRN
Start: 2019-12-19 — End: 2019-12-19
  Administered 2019-12-19: .6 mg via INTRAVENOUS

## 2019-12-19 MED ORDER — LABETALOL HCL 5 MG/ML IV SOLN (WRAP)
20.0000 mg | Freq: Once | INTRAVENOUS | Status: DC
Start: 2019-12-19 — End: 2019-12-19

## 2019-12-19 MED ORDER — DEXAMETHASONE SODIUM PHOSPHATE 4 MG/ML IJ SOLN
INTRAMUSCULAR | Status: AC
Start: 2019-12-19 — End: ?
  Filled 2019-12-19: qty 1

## 2019-12-19 MED ORDER — GLYCOPYRROLATE 1 MG/5ML IJ SOLN
INTRAMUSCULAR | Status: AC
Start: 2019-12-19 — End: ?
  Filled 2019-12-19: qty 5

## 2019-12-19 MED ORDER — OXYCODONE HCL 5 MG PO TABS
5.0000 mg | ORAL_TABLET | Freq: Once | ORAL | Status: DC | PRN
Start: 2019-12-19 — End: 2019-12-19

## 2019-12-19 MED ORDER — AMMONIA AROMATIC IN INHA
1.0000 | Freq: Once | RESPIRATORY_TRACT | Status: DC | PRN
Start: 2019-12-19 — End: 2019-12-19

## 2019-12-19 MED ORDER — PROMETHAZINE HCL 25 MG/ML IJ SOLN
INTRAMUSCULAR | Status: AC
Start: 2019-12-19 — End: ?
  Filled 2019-12-19: qty 1

## 2019-12-19 MED ORDER — KETOROLAC TROMETHAMINE 30 MG/ML IJ SOLN
INTRAMUSCULAR | Status: DC | PRN
Start: 2019-12-19 — End: 2019-12-19
  Administered 2019-12-19: 30 mg via INTRAVENOUS

## 2019-12-19 MED ORDER — DEXAMETHASONE SODIUM PHOSPHATE 4 MG/ML IJ SOLN (WRAP)
INTRAMUSCULAR | Status: DC | PRN
Start: 2019-12-19 — End: 2019-12-19
  Administered 2019-12-19: 10 mg via INTRAVENOUS

## 2019-12-19 MED ORDER — TRAMADOL HCL 50 MG PO TABS
50.0000 mg | ORAL_TABLET | Freq: Four times a day (QID) | ORAL | 0 refills | Status: AC | PRN
Start: 2019-12-19 — End: 2019-12-26

## 2019-12-19 MED ORDER — MIDAZOLAM HCL 1 MG/ML IJ SOLN (WRAP)
INTRAMUSCULAR | Status: DC | PRN
Start: 2019-12-19 — End: 2019-12-19
  Administered 2019-12-19: 2 mg via INTRAVENOUS

## 2019-12-19 MED ORDER — LIDOCAINE 1% BUFFERED - CNR/OUTSOURCED
0.5000 mL | Freq: Once | INTRAMUSCULAR | Status: AC
Start: 2019-12-19 — End: 2019-12-19

## 2019-12-19 MED ORDER — DEXAMETHASONE SOD PHOSPHATE PF 10 MG/ML IJ SOLN
INTRAMUSCULAR | Status: AC
Start: 2019-12-19 — End: ?
  Filled 2019-12-19: qty 1

## 2019-12-19 MED ORDER — HYDROMORPHONE HCL 1 MG/ML IJ SOLN
INTRAMUSCULAR | Status: AC
Start: 2019-12-19 — End: ?
  Filled 2019-12-19: qty 1

## 2019-12-19 SURGICAL SUPPLY — 67 items
DEVICE CLOSURE L12 IN 2-0 V-20 V-LOC (Suture) ×2 IMPLANT
DEVICE CLOSURE L18 IN 0 GS-22 V-LOC (Suture) IMPLANT
DEVICE CLOSURE L18 IN 1 GS-21 V-LOC (Suture) ×1 IMPLANT
DEVICE CLOSURE L9 IN 3-0 CV-23 TAPER (Suture) IMPLANT
DEVICE CLSR 0 GS-22 V-LOC 18IN NABSB BLU (Suture)
DEVICE CLSR 1 GS-21 V-LOC 18IN NABSB BLU (Suture) ×2
DEVICE CLSR 3-0 CV-23 TPR PNT V-LOC 180 (Suture)
DRAPE COLUMN EQUIPMENT DA VINCI XI (Drape) ×1 IMPLANT
DRAPE EQP DVNC XI 21X19X10.5IN ARM 21LB (Drape) ×8 IMPLANT
DRAPE EQP DVNC XI CLMN (Drape) ×2 IMPLANT
DRAPE EQUIPMENT ARM L21 IN X W19 IN X H10.5 IN DA VINCI XI 21 LB (Drape) ×4 IMPLANT
DRESSING WND PU COVAD + 2X2IN LF STRL (Dressing) IMPLANT
GLOVE SRG NPRN PU 8.5 DRMPRN ULT SRFT (Glove) ×4
GLOVE SURGICAL 8 1/2 DERMA PRENE ULTRA (Glove) ×2 IMPLANT
GLOVE SURGICAL 8 1/2 GAMMEX SUREFIT TECHNOLOGY POWDER FREE SMOOTH BEAD (Glove) ×2 IMPLANT
GOWN SRG FBRC 3XL XLNG STD ROYALSILK LF (Gown) ×2
GOWN SURGICAL 3XL XLONG STANDARD FABRIC (Gown) ×1 IMPLANT
GOWN SURGICAL 3XL XLONG STANDARD FABRIC ROYALSILK LEVEL 3 NONREINFORCE (Gown) ×1 IMPLANT
KIT GENERAL ROBOTIC GENERIC (Kits) ×2 IMPLANT
MESH CIRCLE VENTRAL HERNIA REPAIR (Mesh) ×1 IMPLANT
MESH CIRCLE VENTRAL HERNIA REPAIR POLYPROPYLENE HYDROGEL VENTRALIGHT (Mesh) IMPLANT
MESH SRG PP DDRGL CRC VENTRALIGHT ST (Mesh) ×2 IMPLANT
NEEDLE SPINAL BD OD22 GA L3 1/2 IN (Needles) ×1 IMPLANT
NEEDLE SPINAL L3 1/2 IN REGULAR WALL QUINCKE TIP OD22 GA BD (Needles) ×1 IMPLANT
NEEDLE SPNL PP RW BD QNCK 22GA 3.5IN LF (Needles) ×2
OBTURATOR BLDLS 8MM LONG (Instrument) IMPLANT
OBTURATOR LAPAROSCOPIC 8MM WECK VISTA BLADELESS STERILE LATEX FREE (Instrument) ×1 IMPLANT
OBTURATOR LAPSCP WECK VST 8MM LF STRL (Instrument) ×2 IMPLANT
PENCIL SMKEVC LF COAT PSHBTN (Cautery) ×2
PENCIL SMOKE EVACUATOR COATED PUSH (Cautery) ×1 IMPLANT
PENCIL SMOKE EVACUATOR COATED PUSH BUTTON NEPTUNE E-SEP (Cautery) ×1 IMPLANT
PROTECTOR ARM TRENDELENBURG SAFEGUARD (Positioning Supplies) ×1 IMPLANT
PROTECTOR ARM TRENDELENBURG SAFEGUARD EXTREMITY ONE-STEP (Positioning Supplies) ×1 IMPLANT
PROTECTOR XTRMT 1STP TRND SFGRD ARM (Positioning Supplies) ×2
SEAL DAVINCI UNIVERSAL 5-8MM (Procedure Accessories) ×6 IMPLANT
SEALER/DIVIDER LAPAROSCOPIC L37 CM 350 D (Laparoscopy Supplies) IMPLANT
SEALER/DIVIDER LAPAROSCOPIC L37 CM 350 D L18.5 MM MARYLAND CURVE JAW (Laparoscopy Supplies) IMPLANT
SEALER/DIVIDER LAPSCP 350D 18.5MM LGSR (Laparoscopy Supplies)
SET HEATED TUBE WITH RPT (Ortho Supply) ×2 IMPLANT
SLEEVE LAPSCP 5MM 100MM EPTH XCL UNV STAB CNN (Procedure Accessories)
SLEEVE LAPSCP UNV EPTH XCL 5MM 100MM (Procedure Accessories) IMPLANT
SLEEVE LAPSCP UNV EPTH XCL 5MM 100MM LF (Procedure Accessories)
SLEEVE TROCAR L100 MM UNIVERSAL STABILITY OD5 MM ENDOPATH XCEL (Procedure Accessories) IMPLANT
STAPLER OBTURATOR ROBOTIC BLADELESS LONG DA VINCI XI ENDOWRIST 8MM (Instrument) IMPLANT
SUTURE ABS 0 UR-6 VCL 27IN BRD COAT VIOL (Suture) ×2
SUTURE ABS 4-0 PC5 MNCRL MTPS 18IN MFL (Suture) ×2
SUTURE COATED VICRYL 0 UR-6 L27 IN BRAID (Suture) ×1 IMPLANT
SUTURE MONOCRYL 4-0 PC-5 L18 IN (Suture) ×1 IMPLANT
SUTURE MONOCRYL 4-0 PC-5 L18 IN MONOFILAMENT UNDYED ABSORBABLE (Suture) ×1 IMPLANT
SUTURE V-LOC 0 GS-22 1/2 CIRCLE L18 IN NONABS BLUE (Suture) IMPLANT
SUTURE V-LOC 1 GS-21 1/2 CIRCLE L18 IN NONABS BLUE (Suture) IMPLANT
SUTURE V-LOC 180 3-0 CV-23 1/2 CIRCLE L9 IN ABS GREEN (Suture) IMPLANT
SUTURE V-LOC 2-0 V-20 1/2 CIRCLE L12 IN NONABS BLUE (Suture) IMPLANT
SUTURE VLOC 2-0 6IN V20 NONABS (Suture) IMPLANT
SUTURE VLOC 2-0 9IN GS-22 (Suture) IMPLANT
SUTURE VLOC NN-ABS 2-0 V20 30" (Suture) ×4
SYSTEM PSTN PNK PD 29X20X1IN NS (Positioning Supplies) ×2 IMPLANT
TIP CAUT 8MM STD HOT SHR DVNC EWRST CAUT (Procedure Accessories) ×2 IMPLANT
TIP ELECTROCAUTERY HOT SHEARS DA VINCI ENDOWRIST CAUTERY MONOPOLAR 8 (Procedure Accessories) ×1 IMPLANT
TIP SCISSORS 2 ACTION CURVE W5 MM (Endoscopic Supplies) IMPLANT
TIP SCSR CRV VM METZ 5MM 2 ACT DISP (Endoscopic Supplies)
TROCAR LAPAROSCOPIC STABILITY SLVE (Laparoscopy Supplies) ×1
TROCAR LAPSCP EPTH XCL 5MM 100MM LF STRL (Laparoscopy Supplies) ×2 IMPLANT
TROCAR OD5 MM L100 MM BLADELESS STABLE SLEEVE ENDOPATH XCEL ENDOSCOPIC (Laparoscopy Supplies) ×1 IMPLANT
TUBING SMKEVC 3/8IN LF STRL DISP (Tubing)
TUBING SMOKE EVACUATOR OD3/8 IN (Tubing) IMPLANT
TUBING SMOKE EVACUATOR OD3/8 IN NA (Tubing) IMPLANT

## 2019-12-19 NOTE — Transfer of Care (Signed)
Anesthesia Transfer of Care Note    Patient: Catherine Spence    Procedures performed: Procedure(s):  LAPAROSCOPIC ROBOTIC ASSISTED  UMBILICAL HERNIA REAPIR WITH MESH    Anesthesia type: General ETT    Patient location:PACU    Last vitals:   Vitals:    12/19/19 1109   BP: 123/82   Pulse: 71   Resp: 18   SpO2: 98%       Post pain: Patient not complaining of pain, continue current therapy      Mental Status:awake    Respiratory Function: tolerating face mask    Cardiovascular: stable    Nausea/Vomiting: patient not complaining of nausea or vomiting    Hydration Status: adequate    Post assessment: no apparent anesthetic complications    Signed by: Hoyle Sauer Melisha Eggleton  12/19/19 3:40 PM

## 2019-12-19 NOTE — PACU (Signed)
Pt support person, Claris Gower, updated on pt status; understanding verbalized.

## 2019-12-19 NOTE — PACU (Signed)
Pt husband brought to bedside and updated on pt status; understanding verbalized. Pt c/o pain, Dr. Sherron Flemings aware and admin Tramadol po as ordered.

## 2019-12-19 NOTE — Brief Op Note (Signed)
BRIEF OP NOTE    Date Time: 12/19/19 3:11 PM    Patient Name:   Catherine Spence    Date of Operation:   12/19/2019    Providers Performing:   Surgeon(s):  Alen Blew, MD    Assistant (s):   Circulator: Merideth Abbey, Henrene Dodge, RN  Relief Circulator: Madaline Brilliant, RN  Relief Scrub: Hermenia Bers  Scrub Person: Laural Benes  First Assistant: Providence Lanius, RN  Preceptor: Cleda Clarks, RN    Operative Procedure:   Procedure(s):  LAPAROSCOPIC ROBOTIC ASSISTED  UMBILICAL HERNIA REAPIR WITH MESH    Preoperative Diagnosis:   Pre-Op Diagnosis Codes:     * Umbilical hernia without obstruction and without gangrene [K42.9]    Postoperative Diagnosis:   Post-Op Diagnosis Codes:     * Umbilical hernia without obstruction and without gangrene [K42.9]    Anesthesia:   General    Estimated Blood Loss:    * No values recorded between 12/19/2019 12:34 PM and 12/19/2019  3:11 PM *    Implants:     Implant Name Type Inv. Item Serial No. Manufacturer Lot No. LRB No. Used Action   MESH VENTRALIGHT ST 4.5 CIRCLE - [id number] Mesh MESH VENTRALIGHT ST 4.5 CIRCLE  BARD DAVOL [id number] N/A 1 Implanted       Drains:   Drains: no    Specimens:     ID Type Source Tests Collected by Time Destination   A : Hernia Sac Sac Hernia SURGICAL PATHOLOGY Alen Blew, MD 12/19/2019 1334          Findings:   Large sac ,small defect    Complications:   none      Signed by: Alen Blew, MD                                                                           [facility]

## 2019-12-19 NOTE — PACU (Signed)
Patient states readiness to be discharged to home.  Patient states pain and nausea managed at this time. She is complaining of 10/10 pain but says she is ok to go home, does not want pain meds or any other interventions at this time.   Patient and family verbalize understanding of written discharge instructions as reviewed with nursing staff, home medications, plan of care for pain/nausea management, and activity at home and awareness of available resources to contact from home as necessary. Last and future doses of pain medication reviewed and noted. Location of e-prescribed medications identified.  Plan to discharge to home with all belongings and printed paper instructions in discharge folder.

## 2019-12-19 NOTE — Anesthesia Postprocedure Evaluation (Addendum)
Anesthesia Post Evaluation    Patient: Catherine Spence    Procedure(s):  LAPAROSCOPIC ROBOTIC ASSISTED  UMBILICAL HERNIA REAPIR WITH MESH    Anesthesia type: general    Last Vitals:   Vitals Value Taken Time   BP 174/83 12/19/19 1536   Temp 98 12/19/19 1541   Pulse 90 12/19/19 1539   Resp 21 12/19/19 1539   SpO2 98 % 12/19/19 1539   Vitals shown include unvalidated device data.              Anesthesia Post Evaluation:     Patient Evaluated: PACU  Patient Participation: complete - patient participated  Level of Consciousness: awake  Pain Score: 0  Pain Management: adequate    Airway Patency: patent    Anesthetic complications: No      PONV Status: none    Cardiovascular status: acceptable  Respiratory status: acceptable  Hydration status: acceptable        Signed by: Hoyle Sauer So, 12/19/2019 3:41 PM

## 2019-12-19 NOTE — Discharge Instr - AVS First Page (Addendum)
Discharge Instructions:  Robotic Surgery  A. General Information:  1. DO NOT DRIVE a car or operate dangerous machinery for 3-4 days, or while taking narcotic prescription pain pills.  2. DO NOT consume alcohol, tranquilizers, sleeping medications, or any non prescribed medication for 24 hours unless approved by your doctor, or as long as taking narcotic prescription medications.  3. DO NOT make important decisions or sign any important papers in the next 24 hours.  4. Have a responsible person with you tonight.  B. Activity:  1. NO heavy lifting, straining abdominal muscles, bending over a lot, yard work, house work, or sports for 3 weeks.max 15lbs.binder with activity  2. DO NOT drive for 3-4 days or while taking tramadol.  3. It is fine to go for walks, up and down steps, ride in a car.  4. Elevate your head when sleeping/resting.  C. Treatment:  1. You may shower 24 hours after surgery, no baths or swimming for 2 weeks.  Remove band aides or dressings before shower, but leave paper strips (steri strips) on the skin to fall off on their own.  If still on at postoperative visit, they will be removed then.  Keep incisions dry except to shower.    2. Drainage of fluid or blood is not unusual from an incision.  If occurs, can clean with peroxide and cotton ball daily and cover with dry gauze until the wound seals.  3. If a lot of bleeding occurs, can hold pressure with a gauze or cloth over the site for 10 minutes and it will usually stop.  If bleeding continues will need to call for possible evaluation in office or emergency room.  D. Medications:  1. tramadol may be taken for pain as needed, one  tablet every 6 hours.  Stop the narcotic when able since you cannot take it and drive and they cause constipation.  You may switch to plain Tylenol, Advil or Aleve when able.  Many adults fine good pain relief with Advil 600-800 mg three times a day with meals.  This can cause indigestion, ulcers, and kidney  problems though with long term use.  2. Resume all normal medications.  E. Antibiotic (s) if needed.  (NONE)     F.    Begin with clear liquids and may progress to your normal diet if not nauseated.  No high fat, high protein    foods the day of surgery.     G. The following may occur after robotic surgery:  1. Shoulder or upper back ache from retained gas that should resolve in 1-2 days.  Can treat with pain medication.  2. Soreness and bruising at incision sites will resolve with time.  3. Scrotal swelling (labia in women) and bruising is often seen after hernia surgery.  4. Sore throat.  5. Fatigue may last days to weeks.  6. Difficulty urinating may occur and may need to come into emergency room for urinary catheter placement.  Catherine Spence Notify Physician If:  1. Worsening of pain not improved with pain medication.  2. Persistent nausea and vomiting.  3. Fever above 101.  4. Persistent bleeding or swelling at operative site.  5. Unable to urinate and uncomfortable bladder 6-8 hours after surgery.  I. Follow up care:  Please call the office to schedule a follow up visit with Dr. Germaine Pomfret for 2 weeks.  In the event of any postoperative problems or questions, you may call your attending physician's office during business hours, or the  answering service after hours and on weekend to speak to the doctor on call.    Office [phone]  Answering Service       [phone]  I understand the instructions for my postoperative care as described above.  A copy of these instructions has been given to me.                  Witness     Patient/Significant Other Date        Post Anesthesia Discharge Instructions    Although you may be awake and alert in the recovery room, small amounts of anesthetic remain in your system for about 24 hours.  You may feel tired and sleepy during this time.      You are advised to go directly home from the hospital.    Plan to stay at home and rest for the remainder of the day.    It is advisable to  have someone with you at home for 24 hours after surgery.    Do not operate a motor vehicle, or any mechanical or electrical equipment for the next 24 hours.      Be careful when you are walking around, you may become dizzy.  The effects of anesthesia and/or medications are still present and drowsiness may occur    Do not consume alcohol, tranquilizers, sleeping medications, or any other non prescribed medication for the remainder of the day.    Diet:  begin with liquids, progress your diet as tolerated or as directed by your surgeon.  Nausea and vomiting may occur in the next 24 hours.

## 2019-12-20 ENCOUNTER — Encounter: Payer: Self-pay | Admitting: Surgery

## 2019-12-25 LAB — LAB USE ONLY - HISTORICAL SURGICAL PATHOLOGY

## 2020-01-01 NOTE — Op Note (Signed)
Procedure Date: 12/19/2019     Patient Type: A     SURGEON: Carma Lair MD  ASSISTANT:       PREOPERATIVE DIAGNOSIS:  Umbilical hernia.     POSTOPERATIVE DIAGNOSIS:  Umbilical hernia.     TITLE OF PROCEDURE:  Robotic umbilical hernia repair with mesh placement.     ANESTHESIA:  General.     ESTIMATED BLOOD LOSS:  Minimal.     PATHOLOGY:  Hernia sac and fat.     COMPLICATIONS:  None.     CONDITION:  To recovery room in stable condition.     DESCRIPTION OF PROCEDURE:  The patient was brought to the operating room and identified as Catherine Spence.  After administration of anesthesia, the patient's abdomen was  prepped and draped in a normal sterile fashion.  The abdominal cavity was  entered in the left subcostal position using a 5 mm direct visualization  Xcel port technique and on initial inspection the patient had an obvious  hernia in the umbilical position containing some omentum which reduced  spontaneously and preperitoneal fat.  At this time, lateral 8 mm ports were  placed in the left side under direct visualization.  The patient was then  docked with the robot, instruments were placed under visualization and  attention was paid to the umbilical hernia.  The preperitoneal fat and sac  were reduced from the hernia using scissors with cautery and blunt  dissection and after completely removing the sac, which was quite large, it  revealed a smaller defect.  This area was then closed with a #1 V-Loc  suture in a running fashion.  The overall closure site was then measured  and a piece of Ventralight ST mesh was then used for an approximately 5 cm  overlap.  It was secured to the abdominal wall in the 12 and 6 o'clock  positions and sewn in a running fashion from these positions using 2-0  suture.  The midline was also secured using Vicryl suture in an interrupted  fashion.  When this was done, there was excellent coverage of the hernia  space with good approximation to the abdominal wall without tension  or  evidence of further hernia disease.  The hernia sac was removed through 1  of the 8-mm ports in a strip-like fashion.  The area was desufflated under  visualization and remained intact.  The ports removed at the end of  desufflation process under visualization.  Wounds were irrigated, injected  with local and closed subcuticularly with 4-0 Monocryl stitch.  They were  steri-stripped and bandaged.  The patient was extubated and sent to  recovery room in stable condition with all counts being correct at the end  of the case.           D:  01/01/2020 07:59 AM by Dr. Rudy Jew. Germaine Pomfret, MD ([id number])  T:  01/01/2020 09:14 AM by NTS      Everlean Cherry: [id number]) (Doc ID: [id number])

## 2020-01-27 ENCOUNTER — Emergency Department: Payer: Medicaid Other

## 2020-01-27 ENCOUNTER — Other Ambulatory Visit: Payer: Self-pay

## 2020-01-27 ENCOUNTER — Emergency Department
Admission: EM | Admit: 2020-01-27 | Discharge: 2020-01-27 | Disposition: A | Discharge status: Home / Self Care | Payer: Medicaid Other | Attending: Emergency Medicine | Admitting: Emergency Medicine

## 2020-01-27 DIAGNOSIS — N898 Other specified noninflammatory disorders of vagina: Secondary | ICD-10-CM | POA: Insufficient documentation

## 2020-01-27 DIAGNOSIS — I10 Essential (primary) hypertension: Secondary | ICD-10-CM | POA: Insufficient documentation

## 2020-01-27 DIAGNOSIS — N83202 Unspecified ovarian cyst, left side: Secondary | ICD-10-CM | POA: Insufficient documentation

## 2020-01-27 DIAGNOSIS — E860 Dehydration: Secondary | ICD-10-CM | POA: Insufficient documentation

## 2020-01-27 DIAGNOSIS — N83201 Unspecified ovarian cyst, right side: Secondary | ICD-10-CM | POA: Insufficient documentation

## 2020-01-27 DIAGNOSIS — K5900 Constipation, unspecified: Secondary | ICD-10-CM | POA: Insufficient documentation

## 2020-01-27 DIAGNOSIS — R1033 Periumbilical pain: Secondary | ICD-10-CM | POA: Insufficient documentation

## 2020-01-27 DIAGNOSIS — R030 Elevated blood-pressure reading, without diagnosis of hypertension: Secondary | ICD-10-CM

## 2020-01-27 DIAGNOSIS — R197 Diarrhea, unspecified: Secondary | ICD-10-CM | POA: Insufficient documentation

## 2020-01-27 DIAGNOSIS — E669 Obesity, unspecified: Secondary | ICD-10-CM | POA: Insufficient documentation

## 2020-01-27 DIAGNOSIS — D72829 Elevated white blood cell count, unspecified: Secondary | ICD-10-CM | POA: Insufficient documentation

## 2020-01-27 DIAGNOSIS — R935 Abnormal findings on diagnostic imaging of other abdominal regions, including retroperitoneum: Secondary | ICD-10-CM | POA: Insufficient documentation

## 2020-01-27 DIAGNOSIS — Z8719 Personal history of other diseases of the digestive system: Secondary | ICD-10-CM | POA: Insufficient documentation

## 2020-01-27 DIAGNOSIS — Z87440 Personal history of urinary (tract) infections: Secondary | ICD-10-CM | POA: Insufficient documentation

## 2020-01-27 LAB — COMPREHENSIVE METABOLIC PANEL
ALT: 21 U/L (ref 0–55)
AST (SGOT): 15 U/L (ref 5–34)
Albumin/Globulin Ratio: 1.4 (ref 0.9–2.2)
Albumin: 3.7 g/dL (ref 3.5–5.0)
Alkaline Phosphatase: 67 U/L (ref 37–106)
Anion Gap: 13 (ref 5.0–15.0)
BUN: 12.5 mg/dL (ref 7.0–19.0)
Bilirubin, Total: 0.6 mg/dL (ref 0.2–1.2)
CO2: 20 mEq/L — ABNORMAL LOW (ref 22–29)
Calcium: 8.9 mg/dL (ref 8.5–10.5)
Chloride: 105 mEq/L (ref 100–111)
Creatinine: 0.8 mg/dL (ref 0.6–1.0)
Globulin: 2.7 g/dL (ref 2.0–3.6)
Glucose: 90 mg/dL (ref 70–100)
Potassium: 3.9 mEq/L (ref 3.5–5.1)
Protein, Total: 6.4 g/dL (ref 6.0–8.3)
Sodium: 138 mEq/L (ref 136–145)

## 2020-01-27 LAB — CBC AND DIFFERENTIAL
Absolute NRBC: 0 10*3/uL (ref 0.00–0.00)
Basophils Absolute Automated: 0.02 10*3/uL (ref 0.00–0.08)
Basophils Automated: 0.2 %
Eosinophils Absolute Automated: 0.09 10*3/uL (ref 0.00–0.44)
Eosinophils Automated: 0.9 %
Hematocrit: 35.7 % (ref 34.7–43.7)
Hgb: 11.5 g/dL (ref 11.4–14.8)
Immature Granulocytes Absolute: 0.03 10*3/uL (ref 0.00–0.07)
Immature Granulocytes: 0.3 %
Lymphocytes Absolute Automated: 3.12 10*3/uL (ref 0.42–3.22)
Lymphocytes Automated: 31.7 %
MCH: 26 pg (ref 25.1–33.5)
MCHC: 32.2 g/dL (ref 31.5–35.8)
MCV: 80.6 fL (ref 78.0–96.0)
MPV: 9.6 fL (ref 8.9–12.5)
Monocytes Absolute Automated: 0.58 10*3/uL (ref 0.21–0.85)
Monocytes: 5.9 %
Neutrophils Absolute: 6 10*3/uL (ref 1.10–6.33)
Neutrophils: 61 %
Nucleated RBC: 0 /100 WBC (ref 0.0–0.0)
Platelets: 287 10*3/uL (ref 142–346)
RBC: 4.43 10*6/uL (ref 3.90–5.10)
RDW: 14 % (ref 11–15)
WBC: 9.84 10*3/uL — ABNORMAL HIGH (ref 3.10–9.50)

## 2020-01-27 LAB — LIPASE: Lipase: 30 U/L (ref 8–78)

## 2020-01-27 LAB — WHOLE BLOOD CREATININE WITH GFR POCT
GFR POCT: >60 mL/min/{1.73_m2} (ref >60)
Whole Blood Creatinine POCT: 0.6 mg/dL (ref 0.5–1.1)

## 2020-01-27 LAB — SEDIMENTATION RATE: Sed Rate: 17 mm/Hr (ref 0–20)

## 2020-01-27 LAB — URINALYSIS REFLEX TO MICROSCOPIC EXAM - REFLEX TO CULTURE
Bilirubin, UA: NEGATIVE
Blood, UA: NEGATIVE
Glucose, UA: NEGATIVE
Ketones UA: 40 — AB
Leukocyte Esterase, UA: NEGATIVE
Nitrite, UA: NEGATIVE
Protein, UR: NEGATIVE
Specific Gravity UA: 1.035 (ref 1.001–1.035)
Urine pH: 6 (ref 5.0–8.0)
Urobilinogen, UA: 0.2 mg/dL (ref 0.2–2.0)

## 2020-01-27 LAB — HCG, SERUM, QUALITATIVE: Hcg Qualitative: NEGATIVE

## 2020-01-27 LAB — GFR: EGFR: >60

## 2020-01-27 LAB — C-REACTIVE PROTEIN: C-Reactive Protein: 1.4 mg/dL — ABNORMAL HIGH (ref 0.0–0.8)

## 2020-01-27 LAB — MAGNESIUM: Magnesium: 1.8 mg/dL (ref 1.6–2.6)

## 2020-01-27 MED ORDER — LEVOFLOXACIN 500 MG PO TABS
500.0000 mg | ORAL_TABLET | Freq: Every day | ORAL | 0 refills | Status: AC
Start: 2020-01-27 — End: 2020-02-02

## 2020-01-27 MED ORDER — SODIUM CHLORIDE 0.9 % IV BOLUS
1000.0000 mL | Freq: Once | INTRAVENOUS | Status: AC
Start: 2020-01-27 — End: 2020-01-27
  Administered 2020-01-27: 1000 mL via INTRAVENOUS

## 2020-01-27 MED ORDER — KETOROLAC TROMETHAMINE 30 MG/ML IJ SOLN
30.0000 mg | Freq: Once | INTRAMUSCULAR | Status: AC
Start: 2020-01-27 — End: 2020-01-27
  Administered 2020-01-27: 30 mg via INTRAVENOUS
  Filled 2020-01-27: qty 1

## 2020-01-27 MED ORDER — RISAQUAD PO CAPS
1.0000 | ORAL_CAPSULE | Freq: Every day | ORAL | 0 refills | Status: AC
Start: 2020-01-27 — End: 2020-02-26

## 2020-01-27 MED ORDER — IOHEXOL 350 MG/ML IV SOLN
100.0000 mL | Freq: Once | INTRAVENOUS | Status: AC | PRN
Start: 2020-01-27 — End: 2020-01-27
  Administered 2020-01-27: 100 mL via INTRAVENOUS

## 2020-01-27 MED ORDER — LEVOFLOXACIN 500 MG PO TABS
500.0000 mg | ORAL_TABLET | Freq: Once | ORAL | Status: AC
Start: 2020-01-27 — End: 2020-01-27
  Administered 2020-01-27: 500 mg via ORAL
  Filled 2020-01-27: qty 1
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
## 2020-01-27 NOTE — ED Triage Notes (Signed)
Pt presents to ER with her sister. Pt reports she had hernia repair last month. Pt has been taking dulcolax and has had two episodes of explosive diarrhea, one on Friday night while in bed and today while standing in kitchen. Pt c/o odorless vaginal discharge x 2 weeks. Pt c/o diffuse abdominal pain x 5 days. Pt states she had an episode of bright red in toilet with morning BM, unsure if it was blood or from the bag of flaming hot cheetos she ate.

## 2020-01-27 NOTE — Discharge Instructions (Signed)
Abdominal Pain    You have been diagnosed with abdominal (belly) pain. The cause of your pain is not yet known.    Many things can cause abdominal pain such as infections and bowel (intestine) spasms. You might need another examination or more tests to find out why you have pain.    At this time, your pain does not seem to be caused by anything dangerous. You do not need surgery. You do not need to stay in the hospital.     Though we don't believe your condition is dangerous right now, it is important to be careful. Sometimes a problem that seems mild now can become serious later. If you do not get completely better or your symptoms get worse, you should seek more care. This is why it is important that you get additional help unless you are 100% improved   Follow up with your doctor.    For the next 24 hours, Drink only clear liquids such as:   Water.   Clear broth.   Sports drinks.   Clear caffeine-free soft drinks, like 7-Up or Sprite.    Return here or go to the nearest Emergency Department immediately if:   Your pain does not go away or gets worse.   You cannot keep fluids down    Your vomit (throw up) is dark green.    You vomit (throw up) blood or see blood in your stool (poop). Blood might be bright red or dark red. It can also be black and look like tar.   You have a fever (temperature higher than 100.60F / 38C) or shaking chills.   Your skin or eyes look yellow.   Your urine looks brown.   You have severe diarrhea.    If you can't follow up with your doctor, or if at any time you feel you need to be rechecked or seen again, come back here or go to the nearest emergency department.               Dehydration    You have been diagnosed with dehydration.    Dehydration is when your body is low on fluids (liquids). Dehydration has a variety of causes. These range from vomiting and diarrhea, to excessive (a lot of) sweating and poor appetite.    You have received intravenous (IV) fluids.  These are to help fix your dehydration. It is important to keep hydrating yourself at home. Drink plenty of fluids frequently. Drink fluids that won't upset your stomach. This includes water and juice. It also includes drinks like Gatorade. Stay away from beverages like soda pop and tea. They may make you worse. Avoid caffeine and alcohol. They may cause you to lose more fluid.    YOU SHOULD SEEK MEDICAL ATTENTION IMMEDIATELY, EITHER HERE OR AT THE NEAREST EMERGENCY DEPARTMENT, IF ANY OF THE FOLLOWING OCCURS:   Fever (temperature higher than 100.60F / 38C) or shaking chills.   Constant vomiting and/or diarrhea.   Lightheadedness or fainting.   If you do not urinate (pee) for 8 or more hours.             Ovarian Cyst    You have been diagnosed with an ovarian cyst.    Ovarian cysts are common in women with abdominal (belly) pain, pelvic pain or cramping.    Ovarian cysts are like small bubbles filled with fluid. These cysts are also called "ovarian follicles." They form in the ovary during a normal menstrual cycle (period). During  your cycle, an egg ripens inside the cyst. It is getting ready for ovulation. Sometimes a follicle gets large or many follicles form. This can cause pelvic pain or cramps.    If the cyst gets too big, it may break open. When this happens, it releases blood and fluid into the abdomen (belly). This causes sudden severe pain. The pain often gets better within a few hours with no treatment. Some people need pain medicine.    A few women have a condition called polycystic ovarian disease. In these women, several cysts form on both ovaries during the menstrual cycle. This causes irregular (unpredictable) menstrual periods. This condition is caused by hormones.    To diagnose polycystic ovarian disease, a doctor usually examines a woman's pelvis and asks questions about her symptoms. If the doctor cannot feel the cysts with his or her hands, an ultrasound may be needed.    Emergency  ultrasound testing for cysts is rarely needed. Instead, the test is usually during regular weekday office hours in the radiology department. The medical staff will schedule the test in the next few days. You may also see your regular doctor instead to schedule the ultrasound.    The doctor has decided it is OK for you to go home.    If symptoms signal complications like infection or bleeding, you may need to come here or to the nearest Emergency Department.    Follow up with your gynecologist or regular doctor in the next few days. Tell your doctor about this visit.   If you do not have a doctor or clinic to follow up with, tell the medical staff before leaving today. We can help make the arrangements for you.    YOU SHOULD SEEK MEDICAL ATTENTION IMMEDIATELY, EITHER HERE OR AT THE NEAREST EMERGENCY DEPARTMENT, IF ANY OF THE FOLLOWING OCCURS:   You have worse pain in the abdomen (belly), pelvis or back.   More and more vaginal discharge or bleeding, or passing large blood clots.   Fever (temperature higher than 100.66F / 38C), chills, nausea, vomiting (throwing up).   Feeling dizzy, lightheaded or passing out.

## 2020-01-28 LAB — GENITAL CHLAMYDIA/NEISSERIA BY PCR
Chlamydia DNA by PCR: NEGATIVE
Neisseria gonorrhoeae by PCR: NEGATIVE

## 2020-01-29 NOTE — Progress Notes (Signed)
Sti neg, note sent.     Your STI culture are negative.Marland Kitchen    RADIOLOGY: recommend to follow up with your doctor regarding the ovarian cyst/ repeat sonogram and to see gyn to make sure there is resolution : benign vs any concern for malignancy.     If you have any questions recommend to contact your primary care doctor and/or [facility] Emergency Dept [phone].     Please contact your primary care doctor within a week. If worsening signs and symptoms return to ED    Please do not respond to this email.     Cathlean Marseilles, MD  [facility]  [facility].

## 2020-03-05 ENCOUNTER — Emergency Department
Admission: EM | Admit: 2020-03-05 | Discharge: 2020-03-05 | Disposition: A | Discharge status: Home / Self Care | Payer: Medicaid Other | Attending: Emergency Medicine | Admitting: Emergency Medicine

## 2020-03-05 DIAGNOSIS — E860 Dehydration: Secondary | ICD-10-CM

## 2020-03-05 DIAGNOSIS — F17210 Nicotine dependence, cigarettes, uncomplicated: Secondary | ICD-10-CM | POA: Insufficient documentation

## 2020-03-05 DIAGNOSIS — Z3A01 Less than 8 weeks gestation of pregnancy: Secondary | ICD-10-CM | POA: Insufficient documentation

## 2020-03-05 DIAGNOSIS — O26891 Other specified pregnancy related conditions, first trimester: Secondary | ICD-10-CM | POA: Insufficient documentation

## 2020-03-05 DIAGNOSIS — Z3490 Encounter for supervision of normal pregnancy, unspecified, unspecified trimester: Secondary | ICD-10-CM

## 2020-03-05 LAB — URINALYSIS REFLEX TO MICROSCOPIC EXAM - REFLEX TO CULTURE
Bilirubin, UA: NEGATIVE
Blood, UA: NEGATIVE
Glucose, UA: NEGATIVE
Ketones UA: NEGATIVE
Leukocyte Esterase, UA: NEGATIVE
Nitrite, UA: NEGATIVE
Protein, UR: NEGATIVE
Specific Gravity UA: 1.012 (ref 1.001–1.035)
Urine pH: 7.5 (ref 5.0–8.0)
Urobilinogen, UA: 0.2 mg/dL (ref 0.2–2.0)

## 2020-03-05 LAB — HCG, SERUM, QUALITATIVE: Hcg Qualitative: POSITIVE — AB
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
## 2020-03-05 NOTE — ED Triage Notes (Signed)
Pt requesting preg test, states LMP 02/27/2020, took 2 home urine preg test yesterday and 1 today, all had a "faint" positive, pt states this was the same with her other children, reports nausea, but denies V/D.

## 2020-03-05 NOTE — Discharge Instructions (Signed)
Pregnancy    It has been determined you are pregnant.    You originally came here for another problem. However, pregnancy will affect how we evaluate and treat your medical condition.    You have been referred to an obstetrician [facility] doctor). Call to make your appointment today. Close follow-up with an OB doctor is very important for you and your baby.    Get follow-up care as soon as possible, certainly within the next few days. Inform your doctor or obstetrician of your visit here and your pregnancy.    If you smoke, stop. This is necessary to protect yourself and the health of your fetus (developing baby). Smoking makes miscarriage (losing your pregnancy) more likely.    Abusing alcohol or drugs will hurt the fetus (developing baby). It may cause a miscarriage, abnormal development or premature birth. It can also cause severe health or mental problems after birth. Avoid these substances from now until the end of your pregnancy.    If taking pain medicines, antipsychotics, antidepressants or seizure medicine, check with your doctor as soon as possible. Ask if you should continue with these medicines.    Return here or go to the nearest Emergency Department right away if you have: Pain or cramping in the abdomen (belly) or pelvis, vaginal bleeding, nausea/vomiting, dizziness/lightheadedness or passing out.    Keep yourself well-hydrated at all times. Drink a lot of fluids and eat a balanced, healthy, nutritious diet.    Take prenatal vitamins every day. These may be prescribed for you on this visit. You may need to get them from the follow-up care provider. You can also get less expensive kinds over the counter (without a prescription). Ask you pharmacist for details.               Dehydration    You have been diagnosed with dehydration.    Dehydration is when your body is low on fluids (liquids). Dehydration has a variety of causes. These range from vomiting and diarrhea, to excessive (a lot of)  sweating and poor appetite.    Your doctor feels you do not need IV fluids at this time. At home it is important to drink plenty of fluids often. Try fluids that won't upset your stomach. This includes water and juice. It also includes drinks like Gatorade. Stay away from beverages like soda pop and tea. They may make you worse. Avoid caffeine and alcohol. They may cause you to lose more fluid.    YOU SHOULD SEEK MEDICAL ATTENTION IMMEDIATELY, EITHER HERE OR AT THE NEAREST EMERGENCY DEPARTMENT, IF ANY OF THE FOLLOWING OCCURS:   Fever (temperature higher than 100.36F / 38C) or shaking chills.   Constant vomiting and/or diarrhea.   Lightheadedness or fainting.   If you do not urinate (pee) for 8 or more hours.        Follow-up with your GYN for reevaluation in 2 to 3 days.      Return immediately if any vaginal bleeding, pain, fever or other problems.      Continue to take your prenatal vitamins

## 2020-04-03 ENCOUNTER — Emergency Department: Payer: Medicaid Other

## 2020-04-03 ENCOUNTER — Emergency Department
Admission: EM | Admit: 2020-04-03 | Discharge: 2020-04-04 | Disposition: A | Discharge status: Home / Self Care | Payer: Medicaid Other | Attending: Emergency Medicine | Admitting: Emergency Medicine

## 2020-04-03 DIAGNOSIS — Z3A08 8 weeks gestation of pregnancy: Secondary | ICD-10-CM | POA: Insufficient documentation

## 2020-04-03 DIAGNOSIS — Z3491 Encounter for supervision of normal pregnancy, unspecified, first trimester: Secondary | ICD-10-CM

## 2020-04-03 DIAGNOSIS — R0981 Nasal congestion: Secondary | ICD-10-CM

## 2020-04-03 DIAGNOSIS — O99891 Other specified diseases and conditions complicating pregnancy: Secondary | ICD-10-CM | POA: Insufficient documentation

## 2020-04-03 DIAGNOSIS — O9A311 Physical abuse complicating pregnancy, first trimester: Secondary | ICD-10-CM | POA: Insufficient documentation

## 2020-04-03 DIAGNOSIS — S0003XA Contusion of scalp, initial encounter: Secondary | ICD-10-CM | POA: Insufficient documentation

## 2020-04-03 DIAGNOSIS — S161XXA Strain of muscle, fascia and tendon at neck level, initial encounter: Secondary | ICD-10-CM | POA: Insufficient documentation

## 2020-04-03 DIAGNOSIS — T1490XA Injury, unspecified, initial encounter: Secondary | ICD-10-CM

## 2020-04-03 LAB — COMPREHENSIVE METABOLIC PANEL
ALT: 18 U/L (ref 0–55)
AST (SGOT): 12 U/L (ref 5–34)
Albumin/Globulin Ratio: 1.2 (ref 0.9–2.2)
Albumin: 3.6 g/dL (ref 3.5–5.0)
Alkaline Phosphatase: 66 U/L (ref 37–106)
Anion Gap: 11 (ref 5.0–15.0)
BUN: 8 mg/dL (ref 7.0–19.0)
Bilirubin, Total: 0.2 mg/dL (ref 0.2–1.2)
CO2: 22 mEq/L (ref 22–29)
Calcium: 8.8 mg/dL (ref 8.5–10.5)
Chloride: 105 mEq/L (ref 100–111)
Creatinine: 0.8 mg/dL (ref 0.6–1.0)
Globulin: 3 g/dL (ref 2.0–3.6)
Glucose: 106 mg/dL — ABNORMAL HIGH (ref 70–100)
Potassium: 4.1 mEq/L (ref 3.5–5.1)
Protein, Total: 6.6 g/dL (ref 6.0–8.3)
Sodium: 138 mEq/L (ref 136–145)

## 2020-04-03 LAB — CBC AND DIFFERENTIAL
Absolute NRBC: 0 10*3/uL (ref 0.00–0.00)
Basophils Absolute Automated: 0.02 10*3/uL (ref 0.00–0.08)
Basophils Automated: 0.2 %
Eosinophils Absolute Automated: 0.04 10*3/uL (ref 0.00–0.44)
Eosinophils Automated: 0.4 %
Hematocrit: 38 % (ref 34.7–43.7)
Hgb: 12.3 g/dL (ref 11.4–14.8)
Immature Granulocytes Absolute: 0.07 10*3/uL (ref 0.00–0.07)
Immature Granulocytes: 0.6 %
Lymphocytes Absolute Automated: 1.85 10*3/uL (ref 0.42–3.22)
Lymphocytes Automated: 16.5 %
MCH: 26.2 pg (ref 25.1–33.5)
MCHC: 32.4 g/dL (ref 31.5–35.8)
MCV: 80.9 fL (ref 78.0–96.0)
MPV: 9.5 fL (ref 8.9–12.5)
Monocytes Absolute Automated: 0.57 10*3/uL (ref 0.21–0.85)
Monocytes: 5.1 %
Neutrophils Absolute: 8.69 10*3/uL — ABNORMAL HIGH (ref 1.10–6.33)
Neutrophils: 77.2 %
Nucleated RBC: 0 /100 WBC (ref 0.0–0.0)
Platelets: 283 10*3/uL (ref 142–346)
RBC: 4.7 10*6/uL (ref 3.90–5.10)
RDW: 14 % (ref 11–15)
WBC: 11.24 10*3/uL — ABNORMAL HIGH (ref 3.10–9.50)

## 2020-04-03 LAB — GFR: EGFR: >60

## 2020-04-03 LAB — LIPASE: Lipase: 23 U/L (ref 8–78)

## 2020-04-03 LAB — HCG QUANTITATIVE: hCG, Quant.: 144321.4 — ABNORMAL HIGH

## 2020-04-03 MED ORDER — SODIUM CHLORIDE 0.9 % IV BOLUS
1000.0000 mL | Freq: Once | INTRAVENOUS | Status: AC
Start: 2020-04-03 — End: 2020-04-04
  Administered 2020-04-03: 1000 mL via INTRAVENOUS

## 2020-04-03 MED ORDER — LIDOCAINE 5 % EX PTCH
1.0000 | MEDICATED_PATCH | CUTANEOUS | Status: DC
Start: 2020-04-03 — End: 2020-04-04
  Administered 2020-04-03: 1 via TRANSDERMAL
  Filled 2020-04-03: qty 1

## 2020-04-03 MED ORDER — ACETAMINOPHEN 500 MG PO TABS
1000.0000 mg | ORAL_TABLET | Freq: Once | ORAL | Status: AC
Start: 2020-04-03 — End: 2020-04-03
  Administered 2020-04-03: 1000 mg via ORAL
  Filled 2020-04-03: qty 2
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
## 2020-04-04 DIAGNOSIS — U071 COVID-19: Secondary | ICD-10-CM

## 2020-04-04 HISTORY — DX: COVID-19: U07.1

## 2020-04-04 LAB — URINALYSIS REFLEX TO MICROSCOPIC EXAM - REFLEX TO CULTURE
Bilirubin, UA: NEGATIVE
Blood, UA: NEGATIVE
Glucose, UA: NEGATIVE
Ketones UA: NEGATIVE
Leukocyte Esterase, UA: NEGATIVE
Nitrite, UA: NEGATIVE
Protein, UR: NEGATIVE
Specific Gravity UA: 1.026 (ref 1.001–1.035)
Urine pH: 6 (ref 5.0–8.0)
Urobilinogen, UA: 0.2 mg/dL (ref 0.2–2.0)

## 2020-04-04 MED ORDER — ONDANSETRON HCL 4 MG/2ML IJ SOLN
4.0000 mg | Freq: Once | INTRAMUSCULAR | Status: DC
Start: 2020-04-04 — End: 2020-04-04
  Filled 2020-04-04: qty 2

## 2020-04-04 MED ORDER — AZITHROMYCIN 250 MG PO TABS
250.0000 mg | ORAL_TABLET | Freq: Every day | ORAL | 0 refills | Status: AC
Start: 2020-04-04 — End: 2020-04-08

## 2020-04-04 MED ORDER — SODIUM CHLORIDE 0.9 % IV SOLN
12.5000 mg | Freq: Once | INTRAVENOUS | Status: DC
Start: 2020-04-04 — End: 2020-04-04

## 2020-04-04 MED ORDER — PROMETHAZINE HCL 25 MG PO TABS
25.0000 mg | ORAL_TABLET | Freq: Once | ORAL | Status: AC
Start: 2020-04-04 — End: 2020-04-04
  Administered 2020-04-04: 25 mg via ORAL
  Filled 2020-04-04: qty 1

## 2020-04-04 MED ORDER — AZITHROMYCIN 250 MG PO TABS
500.0000 mg | ORAL_TABLET | Freq: Once | ORAL | Status: AC
Start: 2020-04-04 — End: 2020-04-04
  Administered 2020-04-04: 500 mg via ORAL
  Filled 2020-04-04: qty 2
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
## 2020-04-15 ENCOUNTER — Emergency Department
Admission: EM | Admit: 2020-04-15 | Discharge: 2020-04-15 | Disposition: A | Discharge status: Home / Self Care | Payer: Medicaid Other | Attending: Emergency Medicine | Admitting: Emergency Medicine

## 2020-04-15 DIAGNOSIS — Z87891 Personal history of nicotine dependence: Secondary | ICD-10-CM | POA: Insufficient documentation

## 2020-04-15 DIAGNOSIS — O219 Vomiting of pregnancy, unspecified: Secondary | ICD-10-CM

## 2020-04-15 DIAGNOSIS — Z3A10 10 weeks gestation of pregnancy: Secondary | ICD-10-CM | POA: Insufficient documentation

## 2020-04-15 DIAGNOSIS — O98511 Other viral diseases complicating pregnancy, first trimester: Secondary | ICD-10-CM | POA: Insufficient documentation

## 2020-04-15 DIAGNOSIS — O99511 Diseases of the respiratory system complicating pregnancy, first trimester: Secondary | ICD-10-CM | POA: Insufficient documentation

## 2020-04-15 DIAGNOSIS — Z20822 Contact with and (suspected) exposure to covid-19: Secondary | ICD-10-CM

## 2020-04-15 DIAGNOSIS — U071 COVID-19: Secondary | ICD-10-CM | POA: Insufficient documentation

## 2020-04-15 DIAGNOSIS — J45909 Unspecified asthma, uncomplicated: Secondary | ICD-10-CM | POA: Insufficient documentation

## 2020-04-15 LAB — URINALYSIS REFLEX TO MICROSCOPIC EXAM - REFLEX TO CULTURE
Bilirubin, UA: NEGATIVE
Blood, UA: NEGATIVE
Glucose, UA: NEGATIVE
Ketones UA: NEGATIVE
Leukocyte Esterase, UA: NEGATIVE
Nitrite, UA: NEGATIVE
Protein, UR: NEGATIVE
RBC, UA: 0 /hpf (ref 0–5)
Specific Gravity UA: 1.027 (ref 1.001–1.035)
Urine pH: 6 (ref 5.0–8.0)
Urobilinogen, UA: 0.2 mg/dL (ref 0.2–2.0)

## 2020-04-15 LAB — CBC AND DIFFERENTIAL
Absolute NRBC: 0 10*3/uL (ref 0.00–0.00)
Basophils Absolute Automated: 0.01 10*3/uL (ref 0.00–0.08)
Basophils Automated: 0.2 %
Eosinophils Absolute Automated: 0.04 10*3/uL (ref 0.00–0.44)
Eosinophils Automated: 0.7 %
Hematocrit: 39.2 % (ref 34.7–43.7)
Hgb: 12.9 g/dL (ref 11.4–14.8)
Immature Granulocytes Absolute: 0.01 10*3/uL (ref 0.00–0.07)
Immature Granulocytes: 0.2 %
Lymphocytes Absolute Automated: 1.7 10*3/uL (ref 0.42–3.22)
Lymphocytes Automated: 31.1 %
MCH: 26.2 pg (ref 25.1–33.5)
MCHC: 32.9 g/dL (ref 31.5–35.8)
MCV: 79.5 fL (ref 78.0–96.0)
MPV: 9.6 fL (ref 8.9–12.5)
Monocytes Absolute Automated: 0.38 10*3/uL (ref 0.21–0.85)
Monocytes: 6.9 %
Neutrophils Absolute: 3.33 10*3/uL (ref 1.10–6.33)
Neutrophils: 60.9 %
Nucleated RBC: 0 /100 WBC (ref 0.0–0.0)
Platelets: 176 10*3/uL (ref 142–346)
RBC: 4.93 10*6/uL (ref 3.90–5.10)
RDW: 14 % (ref 11–15)
WBC: 5.47 10*3/uL (ref 3.10–9.50)

## 2020-04-15 LAB — COMPREHENSIVE METABOLIC PANEL
ALT: 34 U/L (ref 0–55)
AST (SGOT): 23 U/L (ref 5–34)
Albumin/Globulin Ratio: 1.1 (ref 0.9–2.2)
Albumin: 3.6 g/dL (ref 3.5–5.0)
Alkaline Phosphatase: 70 U/L (ref 37–106)
Anion Gap: 12 (ref 5.0–15.0)
BUN: 9 mg/dL (ref 7.0–19.0)
Bilirubin, Total: 0.4 mg/dL (ref 0.2–1.2)
CO2: 19 mEq/L — ABNORMAL LOW (ref 22–29)
Calcium: 8.6 mg/dL (ref 8.5–10.5)
Chloride: 107 mEq/L (ref 100–111)
Creatinine: 0.7 mg/dL (ref 0.6–1.0)
Globulin: 3.2 g/dL (ref 2.0–3.6)
Glucose: 96 mg/dL (ref 70–100)
Potassium: 3.9 mEq/L (ref 3.5–5.1)
Protein, Total: 6.8 g/dL (ref 6.0–8.3)
Sodium: 138 mEq/L (ref 136–145)

## 2020-04-15 LAB — GFR: EGFR: >60

## 2020-04-15 LAB — LIPASE: Lipase: 29 U/L (ref 8–78)

## 2020-04-15 MED ORDER — VITAMIN B-6 50 MG PO TABS
50.0000 mg | ORAL_TABLET | Freq: Every day | ORAL | Status: DC
Start: 2020-04-15 — End: 2020-04-15

## 2020-04-15 MED ORDER — IPRATROPIUM BROMIDE 0.02 % IN SOLN
0.5000 mg | Freq: Once | RESPIRATORY_TRACT | Status: AC
Start: 2020-04-15 — End: 2020-04-15
  Administered 2020-04-15: 0.5 mg via RESPIRATORY_TRACT
  Filled 2020-04-15: qty 2.5

## 2020-04-15 MED ORDER — ALBUTEROL SULFATE (2.5 MG/3ML) 0.083% IN NEBU
2.5000 mg | INHALATION_SOLUTION | RESPIRATORY_TRACT | 0 refills | Status: DC | PRN
Start: 2020-04-15 — End: 2020-05-15

## 2020-04-15 MED ORDER — LACTATED RINGERS IV BOLUS
1000.0000 mL | Freq: Once | INTRAVENOUS | Status: AC
Start: 2020-04-15 — End: 2020-04-15
  Administered 2020-04-15: 1000 mL via INTRAVENOUS

## 2020-04-15 MED ORDER — DOXYLAMINE-PYRIDOXINE 10-10 MG PO TBEC
2.0000 | DELAYED_RELEASE_TABLET | Freq: Every evening | ORAL | 0 refills | Status: AC
Start: 2020-04-15 — End: 2020-04-22

## 2020-04-15 MED ORDER — ALBUTEROL SULFATE (2.5 MG/3ML) 0.083% IN NEBU
2.5000 mg | INHALATION_SOLUTION | Freq: Once | RESPIRATORY_TRACT | Status: AC
Start: 2020-04-15 — End: 2020-04-15
  Administered 2020-04-15: 2.5 mg via RESPIRATORY_TRACT
  Filled 2020-04-15: qty 3

## 2020-04-15 MED ORDER — ONDANSETRON HCL 4 MG/2ML IJ SOLN
4.0000 mg | Freq: Once | INTRAMUSCULAR | Status: DC
Start: 2020-04-15 — End: 2020-04-15

## 2020-04-15 MED ORDER — NEBULIZER MISC
0 refills | Status: DC
Start: 2020-04-15 — End: 2021-12-31
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
## 2020-04-15 NOTE — ED Triage Notes (Signed)
[redacted] weeks pregnant; states has been vomiting since 04/05/20. C/o sinus pressure and headache as well.

## 2020-04-16 LAB — COVID-19 (SARS-COV-2): SARS CoV 2 Overall Result: DETECTED — AB

## 2020-04-22 LAB — RPR: RPR: NONREACTIVE

## 2020-04-22 LAB — ANTIBODY SCREEN: AB Screen Gel: NEGATIVE

## 2020-04-22 LAB — RUBELLA ANTIBODY, IGG: Rubella AB, IgG: NON-IMMUNE/NOT IMMUNE

## 2020-04-22 LAB — HIV AG/AB 4TH GENERATION: HIV Ag/Ab, 4th Generation: NONREACTIVE

## 2020-04-22 LAB — HEPATITIS B SURFACE ANTIGEN W/ REFLEX TO CONFIRMATION: Hepatitis B Surface Antigen: NEGATIVE

## 2020-06-04 ENCOUNTER — Emergency Department
Admission: EM | Admit: 2020-06-04 | Discharge: 2020-06-04 | Disposition: A | Discharge status: Home / Self Care | Payer: Medicaid Other | Attending: Emergency Medical Services | Admitting: Emergency Medical Services

## 2020-06-04 ENCOUNTER — Emergency Department: Payer: Medicaid Other

## 2020-06-04 DIAGNOSIS — O3482 Maternal care for other abnormalities of pelvic organs, second trimester: Secondary | ICD-10-CM | POA: Insufficient documentation

## 2020-06-04 DIAGNOSIS — N83201 Unspecified ovarian cyst, right side: Secondary | ICD-10-CM | POA: Insufficient documentation

## 2020-06-04 DIAGNOSIS — R109 Unspecified abdominal pain: Secondary | ICD-10-CM

## 2020-06-04 DIAGNOSIS — Z3A16 16 weeks gestation of pregnancy: Secondary | ICD-10-CM | POA: Insufficient documentation

## 2020-06-04 LAB — URINALYSIS REFLEX TO MICROSCOPIC EXAM - REFLEX TO CULTURE
Bilirubin, UA: NEGATIVE
Blood, UA: NEGATIVE
Glucose, UA: NEGATIVE
Ketones UA: NEGATIVE
Leukocyte Esterase, UA: NEGATIVE
Nitrite, UA: NEGATIVE
Protein, UR: 30 — AB
Specific Gravity UA: 1.024 (ref 1.001–1.035)
Urine pH: 5 (ref 5.0–8.0)
Urobilinogen, UA: NORMAL mg/dL (ref 0.2–2.0)

## 2020-06-04 LAB — COMPREHENSIVE METABOLIC PANEL
ALT: 9 U/L (ref 0–55)
AST (SGOT): 10 U/L (ref 5–34)
Albumin/Globulin Ratio: 1 (ref 0.9–2.2)
Albumin: 3 g/dL — ABNORMAL LOW (ref 3.5–5.0)
Alkaline Phosphatase: 49 U/L (ref 37–106)
Anion Gap: 5 (ref 5.0–15.0)
BUN: 6 mg/dL — ABNORMAL LOW (ref 7–19)
Bilirubin, Total: 0.1 mg/dL — ABNORMAL LOW (ref 0.2–1.2)
CO2: 23 mEq/L (ref 22–29)
Calcium: 8.3 mg/dL — ABNORMAL LOW (ref 8.5–10.5)
Chloride: 110 mEq/L (ref 100–111)
Creatinine: 0.7 mg/dL (ref 0.6–1.0)
Globulin: 2.9 g/dL (ref 2.0–3.6)
Glucose: 88 mg/dL (ref 70–100)
Potassium: 4.4 mEq/L (ref 3.5–5.1)
Protein, Total: 5.9 g/dL — ABNORMAL LOW (ref 6.0–8.3)
Sodium: 138 mEq/L (ref 136–145)

## 2020-06-04 LAB — GFR: EGFR: >60

## 2020-06-04 LAB — HCG QUANTITATIVE: hCG, Quant.: 14796

## 2020-06-04 LAB — CBC AND DIFFERENTIAL
Absolute NRBC: 0 10*3/uL (ref 0.00–0.00)
Basophils Absolute Automated: 0.01 10*3/uL (ref 0.00–0.08)
Basophils Automated: 0.1 %
Eosinophils Absolute Automated: 0.18 10*3/uL (ref 0.00–0.44)
Eosinophils Automated: 2 %
Hematocrit: 34.6 % — ABNORMAL LOW (ref 34.7–43.7)
Hgb: 11 g/dL — ABNORMAL LOW (ref 11.4–14.8)
Immature Granulocytes Absolute: 0.04 10*3/uL (ref 0.00–0.07)
Immature Granulocytes: 0.5 %
Lymphocytes Absolute Automated: 2.29 10*3/uL (ref 0.42–3.22)
Lymphocytes Automated: 26 %
MCH: 26.1 pg (ref 25.1–33.5)
MCHC: 31.8 g/dL (ref 31.5–35.8)
MCV: 82 fL (ref 78.0–96.0)
MPV: 9.6 fL (ref 8.9–12.5)
Monocytes Absolute Automated: 0.53 10*3/uL (ref 0.21–0.85)
Monocytes: 6 %
Neutrophils Absolute: 5.77 10*3/uL (ref 1.10–6.33)
Neutrophils: 65.4 %
Nucleated RBC: 0 /100 WBC (ref 0.0–0.0)
Platelets: 255 10*3/uL (ref 142–346)
RBC: 4.22 10*6/uL (ref 3.90–5.10)
RDW: 14 % (ref 11–15)
WBC: 8.82 10*3/uL (ref 3.10–9.50)

## 2020-06-04 LAB — HCG, SERUM, QUALITATIVE: Hcg Qualitative: POSITIVE — AB

## 2020-06-04 LAB — LIPASE: Lipase: 42 U/L (ref 8–78)

## 2020-06-04 MED ORDER — ACETAMINOPHEN 500 MG PO TABS
1000.0000 mg | ORAL_TABLET | Freq: Once | ORAL | Status: AC
Start: 2020-06-04 — End: 2020-06-04
  Administered 2020-06-04: 1000 mg via ORAL
  Filled 2020-06-04: qty 2

## 2020-06-04 NOTE — ED Triage Notes (Signed)
Pt is about [redacted]weeks pregnant and started ago with right flank/groin pain that has gotten worse and increases with movement.  No report of vaginal bleeding.

## 2020-06-04 NOTE — Discharge Instructions (Signed)
Dear Catherine Spence,    You were seen today by Ouida Sills, PA-C. Thank you for choosing the [facility] Emergency Department for your healthcare needs.  We hope your visit today was EXCELLENT.    Follow up with your OB tomorrow as scheduled.   Return to ER if you develop fever, worsening or severe pain, or other symptoms that concern you.   May take Tylenol 650mg  every 4 hours as needed for pain.     Please take any medications prescribed as directed.   Return to the emergency department for any new or worsening symptoms.    If you have any questions or concerns, I am available at [phone]. Please do not hesitate to contact me if I can be of assistance.     Below is some information and resources that our patients often find helpful.    Sincerely,    Ouida Sills, Physician Assistant  [facility] - Department of Emergency Medicine    ________________________________________________________________      Thank you for choosing [facility] for your emergency care needs.  We strive to provide EXCELLENT care to you and your family.      DOCTOR REFERRALS  Call [phone] if you need any further referrals and we can help you find a primary care doctor or specialist.  Also, available online at:  [email or web address]    YOUR CONTACT INFORMATION  Before leaving please check with registration to make sure we have an up-to-date contact number.  You can call registration at [phone] to update your information.  For questions about your hospital bill, please call [phone].  For questions about your Emergency Dept Physician bill please call [phone].      FREE HEALTH SERVICES  If you need help with health or social services, please call 2-1-1 for a free referral to resources in your area.  2-1-1 is a free service connecting people with information on health insurance, free clinics, pregnancy, mental health, dental care, food assistance,  housing, and substance abuse counseling.  Also, available online at:  http://[email or web address].org    MEDICAL RECORDS AND TESTS  Certain laboratory test results do not come back the same day, for example urine cultures.  We will contact you if other important findings are noted.  Radiology films are often reviewed again to ensure accuracy.  If there is any discrepancy, we will notify you.      Please call [phone] to pick up a complimentary CD of any radiology studies performed.  If you or your doctor would like to request a copy of your medical records, please call [phone].      ORTHOPEDIC INJURY   Please know that significant injuries can exist even when an initial x-ray is read as normal or negative.  This can occur because some fractures (broken bones) are not initially visible on x-rays.  For this reason, close outpatient follow-up with your primary care doctor or bone specialist (orthopedist) is required.    MEDICATIONS AND FOLLOWUP  Please be aware that some prescription medications can cause drowsiness.  Use caution when driving or operating machinery.    The examination and treatment you have received in our Emergency Department is provided on an emergency basis, and is not intended to be a substitute for your primary care physician.  It is important that your doctor checks you again and that you report any new or remaining problems at that time.  LOCAL PHARMACIES  [facility] - [address], [place], [place] [address] (1.4 miles, 7 minutes)  [facility] - [address], [place], [place] [address] (6.5 miles, 13 minutes)  Handout with directions available on request    PATIENT RELATIONS  If you have any concerns, issues, or feedback related to your care, positive or negative, please do not hesitate to contact Patient Relations at [phone]. They are open from 8:30AM-5:00PM Monday through Friday.             Abdominal Pain    You have been diagnosed with abdominal (belly) pain. The cause of your pain is  not yet known.    Many things can cause abdominal pain such as infections and bowel (intestine) spasms. You might need another examination or more tests to find out why you have pain.    At this time, your pain does not seem to be caused by anything dangerous. You do not need surgery. You do not need to stay in the hospital.     Though we don't believe your condition is dangerous right now, it is important to be careful. Sometimes a problem that seems mild now can become serious later. If you do not get completely better or your symptoms get worse, you should seek more care. This is why it is important that you get additional help unless you are 100% improved    Follow up with your regular doctor in:   24 hours.    For the next 24 hours, Drink only clear liquids such as:   Water.   Clear broth.   Sports drinks.   Clear caffeine-free soft drinks, like 7-Up or Sprite.    Return here or go to the nearest Emergency Department immediately if:   Your pain does not go away or gets worse.   You cannot keep fluids down    Your vomit (throw up) is dark green.    You vomit (throw up) blood or see blood in your stool (poop). Blood might be bright red or dark red. It can also be black and look like tar.   You have a fever (temperature higher than 100.60F / 38C) or shaking chills.   Your skin or eyes look yellow.   Your urine looks brown.   You have severe diarrhea.    If you can't follow up with your doctor, or if at any time you feel you need to be rechecked or seen again, come back here or go to the nearest emergency department.
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
## 2020-07-01 LAB — GROUP B STREP TRANSCRIBED: GBS Transcribed: POSITIVE

## 2020-09-18 ENCOUNTER — Emergency Department
Admission: EM | Admit: 2020-09-18 | Discharge: 2020-09-18 | Disposition: A | Discharge status: Home / Self Care | Payer: Medicaid Other | Attending: Obstetrics & Gynecology | Admitting: Obstetrics & Gynecology

## 2020-09-18 ENCOUNTER — Emergency Department: Payer: Medicaid Other

## 2020-09-18 DIAGNOSIS — N930 Postcoital and contact bleeding: Secondary | ICD-10-CM | POA: Insufficient documentation

## 2020-09-18 DIAGNOSIS — Z3689 Encounter for other specified antenatal screening: Secondary | ICD-10-CM

## 2020-09-18 DIAGNOSIS — O99891 Other specified diseases and conditions complicating pregnancy: Secondary | ICD-10-CM | POA: Insufficient documentation

## 2020-09-18 DIAGNOSIS — Z3A31 31 weeks gestation of pregnancy: Secondary | ICD-10-CM | POA: Insufficient documentation

## 2020-09-18 NOTE — Progress Notes (Signed)
Pt for [place] home verbal and written  instr given pt expressed understanding.
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
## 2020-09-18 NOTE — ED Notes (Signed)
Efm off per a [facility] cnm order while awating ob [employer]

## 2020-09-18 NOTE — Progress Notes (Signed)
Pt to sono via wheelchair accompanied by hospital transport team member

## 2020-09-18 NOTE — Progress Notes (Signed)
Pt returned back from [place] . A tilghman cnm aware . Awaiting sono results

## 2020-09-28 ENCOUNTER — Other Ambulatory Visit: Payer: Self-pay | Admitting: Obstetrics & Gynecology

## 2020-09-28 DIAGNOSIS — Z369 Encounter for antenatal screening, unspecified: Secondary | ICD-10-CM

## 2020-09-29 LAB — GONOCOCCUS CULTURE
Chlamydia trachomatis Culture: NEGATIVE
Culture Gonorrhoeae: NEGATIVE

## 2020-09-30 ENCOUNTER — Ambulatory Visit
Admission: RE | Admit: 2020-09-30 | Discharge: 2020-09-30 | Disposition: A | Discharge status: Home / Self Care | Payer: Medicaid Other | Source: Ambulatory Visit | Attending: Obstetrics & Gynecology | Admitting: Obstetrics & Gynecology

## 2020-09-30 DIAGNOSIS — J4540 Moderate persistent asthma, uncomplicated: Secondary | ICD-10-CM | POA: Insufficient documentation

## 2020-09-30 DIAGNOSIS — O3483 Maternal care for other abnormalities of pelvic organs, third trimester: Secondary | ICD-10-CM | POA: Insufficient documentation

## 2020-09-30 DIAGNOSIS — E669 Obesity, unspecified: Secondary | ICD-10-CM | POA: Insufficient documentation

## 2020-09-30 DIAGNOSIS — O99213 Obesity complicating pregnancy, third trimester: Secondary | ICD-10-CM | POA: Insufficient documentation

## 2020-09-30 DIAGNOSIS — O99613 Diseases of the digestive system complicating pregnancy, third trimester: Secondary | ICD-10-CM | POA: Insufficient documentation

## 2020-09-30 DIAGNOSIS — O99513 Diseases of the respiratory system complicating pregnancy, third trimester: Secondary | ICD-10-CM | POA: Insufficient documentation

## 2020-09-30 DIAGNOSIS — K429 Umbilical hernia without obstruction or gangrene: Secondary | ICD-10-CM | POA: Insufficient documentation

## 2020-09-30 DIAGNOSIS — Z369 Encounter for antenatal screening, unspecified: Secondary | ICD-10-CM

## 2020-09-30 DIAGNOSIS — Z3A33 33 weeks gestation of pregnancy: Secondary | ICD-10-CM | POA: Insufficient documentation

## 2020-09-30 DIAGNOSIS — R0602 Shortness of breath: Secondary | ICD-10-CM

## 2020-09-30 DIAGNOSIS — J45909 Unspecified asthma, uncomplicated: Secondary | ICD-10-CM

## 2020-09-30 DIAGNOSIS — N83201 Unspecified ovarian cyst, right side: Secondary | ICD-10-CM | POA: Insufficient documentation

## 2020-09-30 NOTE — Consults (Signed)
MFM [facility]    Dear Dr. Linna Caprice,     I had the pleasure of seeing your patient at your request in our [place] ATC office today for a consultation regarding her risk factors and medical problems.    Main recommendations:  - Return at 36w for BPP  - Delivery at the 37w range  - Continue with Advair  - Follow up with General Surgery after delivery (earlier if hernia strangulation)    If you have any questions, please feel free to contact me.   Thanks for allowing MFM to participate in this patient's care.    Baron Sane, MD, MScE  MFM  30 min visit; 5 min pre-visit review of records, 15 min face-to-face, 10 min charting and coordination of care. This was time beyond time spent performing today's scan  ---    30 yo  [id number]  [redacted]w[redacted]d    Pennelope has pre-existing conditions:  - BMI >40  - Moderate persistent asthma, worsening in pregnancy  - Umbilical hernia, s/p mesh repair in September 2021 prior to conception    The [place] today showed normal fetal biometry, 2273 gms (53%ile) and normal fluid.  There is a small simple cyst in the R adnexa; based on its size and appearance, I do not anticipate adverse outcomes at least until delivery.    She is very uncomfortable with her umbilical hernia, at risk for further protrusion.  I anticipate this will have to be evaluated by General Surgery after delivery provided no strangulation ensues before.    I contacted her Pulmonologist, Dr Sharma Covert; he informed me of the following: Ayleah had a spirometry in May 2022 which showed Moderate obstructive disease, FEV 62% and FVC 71%.   These values are of concern and place her at the borderline of severe persistent asthma and airflow limitation.  She seemed stable today, but her pulmonary function will not improve for the remainder of the pregnancy.  I recommend considering delivery at term at the 37w range.    I contacted Sagan after Dr Sharma Covert communicated with me and informed her of the update on delivery timing recommendation.  She will return at 36w for a  BPP due to BMI >40.  Hydration was encouraged.     I explained the above assessment and plan to Theda Sers; she verbalized understanding and agreed to the plan.

## 2020-10-01 ENCOUNTER — Other Ambulatory Visit: Payer: Self-pay

## 2020-10-01 ENCOUNTER — Other Ambulatory Visit: Payer: Self-pay | Admitting: Obstetrics & Gynecology

## 2020-10-01 DIAGNOSIS — Z369 Encounter for antenatal screening, unspecified: Secondary | ICD-10-CM

## 2020-10-09 ENCOUNTER — Emergency Department
Admission: EM | Admit: 2020-10-09 | Discharge: 2020-10-09 | Disposition: A | Discharge status: Home / Self Care | Payer: Medicaid Other

## 2020-10-09 DIAGNOSIS — O2333 Infections of other parts of urinary tract in pregnancy, third trimester: Secondary | ICD-10-CM | POA: Insufficient documentation

## 2020-10-09 DIAGNOSIS — Z3A34 34 weeks gestation of pregnancy: Secondary | ICD-10-CM | POA: Insufficient documentation

## 2020-10-09 DIAGNOSIS — B373 Candidiasis of vulva and vagina: Secondary | ICD-10-CM | POA: Insufficient documentation

## 2020-10-09 DIAGNOSIS — N898 Other specified noninflammatory disorders of vagina: Secondary | ICD-10-CM

## 2020-10-09 DIAGNOSIS — B3731 Acute candidiasis of vulva and vagina: Secondary | ICD-10-CM

## 2020-10-09 LAB — URINALYSIS WITH MICROSCOPIC
Bilirubin, UA: NEGATIVE
Blood, UA: NEGATIVE
Glucose, UA: NEGATIVE
Ketones UA: NEGATIVE
Nitrite, UA: NEGATIVE
Protein, UR: NEGATIVE
Specific Gravity UA: 1.005 (ref 1.001–1.035)
Urine pH: 7 (ref 5.0–8.0)
Urobilinogen, UA: NORMAL mg/dL (ref 0.2–2.0)

## 2020-10-09 LAB — RUPTURE OF MEMBRANE AMNISURE: Rupture of Membrane AmniSure: NEGATIVE

## 2020-10-09 MED ORDER — ACETAMINOPHEN 500 MG PO TABS
1000.0000 mg | ORAL_TABLET | Freq: Once | ORAL | Status: AC
Start: 2020-10-09 — End: 2020-10-09
  Administered 2020-10-09: 1000 mg via ORAL
  Filled 2020-10-09: qty 2

## 2020-10-09 MED ORDER — MICONAZOLE NITRATE 2 % VA CREA
1.0000 | TOPICAL_CREAM | Freq: Every evening | VAGINAL | 0 refills | Status: AC
Start: 2020-10-09 — End: 2020-10-16

## 2020-10-09 MED ORDER — ACETAMINOPHEN 325 MG PO TABS
325.0000 mg | ORAL_TABLET | Freq: Four times a day (QID) | ORAL | Status: DC | PRN
Start: 2020-10-09 — End: 2020-10-09

## 2020-10-09 NOTE — Progress Notes (Signed)
Pt c/o LOF for 3 days but its getting more noticeable since this am.   States baby is moving less than usual.    Comfort CNM at bedside

## 2020-10-09 NOTE — Progress Notes (Signed)
General pain and discomfort

## 2020-10-09 NOTE — ED Notes (Signed)
Comfort cnm at bedside reviewing POC and d/c instructions
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
## 2020-10-09 NOTE — Progress Notes (Signed)
Pt requesting tylenol for hip pain   reporst she is sensitive to percocet but takes tylenol without issues,

## 2020-10-13 ENCOUNTER — Ambulatory Visit: Payer: Self-pay

## 2020-10-22 ENCOUNTER — Ambulatory Visit
Admission: RE | Admit: 2020-10-22 | Discharge: 2020-10-22 | Disposition: A | Discharge status: Home / Self Care | Payer: Medicaid Other | Source: Ambulatory Visit | Attending: Obstetrics & Gynecology | Admitting: Obstetrics & Gynecology

## 2020-10-22 DIAGNOSIS — Z3A36 36 weeks gestation of pregnancy: Secondary | ICD-10-CM | POA: Insufficient documentation

## 2020-10-22 DIAGNOSIS — K429 Umbilical hernia without obstruction or gangrene: Secondary | ICD-10-CM | POA: Insufficient documentation

## 2020-10-22 DIAGNOSIS — E669 Obesity, unspecified: Secondary | ICD-10-CM | POA: Insufficient documentation

## 2020-10-22 DIAGNOSIS — O99513 Diseases of the respiratory system complicating pregnancy, third trimester: Secondary | ICD-10-CM | POA: Insufficient documentation

## 2020-10-22 DIAGNOSIS — J45909 Unspecified asthma, uncomplicated: Secondary | ICD-10-CM | POA: Insufficient documentation

## 2020-10-22 DIAGNOSIS — O99213 Obesity complicating pregnancy, third trimester: Secondary | ICD-10-CM | POA: Insufficient documentation

## 2020-10-22 DIAGNOSIS — O99891 Other specified diseases and conditions complicating pregnancy: Secondary | ICD-10-CM | POA: Insufficient documentation

## 2020-10-22 DIAGNOSIS — Z369 Encounter for antenatal screening, unspecified: Secondary | ICD-10-CM

## 2020-10-22 DIAGNOSIS — O288 Other abnormal findings on antenatal screening of mother: Secondary | ICD-10-CM | POA: Insufficient documentation

## 2020-10-22 MED ORDER — BETAMETHASONE SOD PHOS & ACET 6 (3-3) MG/ML IJ SUSP
12.0000 mg | Freq: Once | INTRAMUSCULAR | Status: AC
Start: 2020-10-22 — End: 2020-10-22
  Administered 2020-10-22: 12 mg via INTRAMUSCULAR

## 2020-10-22 NOTE — Progress Notes (Signed)
Betamethasone injection #1 given in right ventrogluteal muscle, tolerated well.  Discussed benefits and side effects of medication, verbalized understanding of material.  Will return tomorrow for 2nd injection.

## 2020-10-23 ENCOUNTER — Ambulatory Visit
Admission: RE | Admit: 2020-10-23 | Discharge: 2020-10-23 | Disposition: A | Discharge status: Home / Self Care | Payer: Medicaid Other | Source: Ambulatory Visit

## 2020-10-23 DIAGNOSIS — O99210 Obesity complicating pregnancy, unspecified trimester: Secondary | ICD-10-CM | POA: Insufficient documentation

## 2020-10-23 DIAGNOSIS — O280 Abnormal hematological finding on antenatal screening of mother: Secondary | ICD-10-CM | POA: Insufficient documentation

## 2020-10-23 MED ORDER — BETAMETHASONE SOD PHOS & ACET 6 (3-3) MG/ML IJ SUSP
12.0000 mg | Freq: Once | INTRAMUSCULAR | Status: AC
Start: 2020-10-23 — End: 2020-10-23
  Administered 2020-10-23: 12 mg via INTRAMUSCULAR

## 2020-10-23 NOTE — Progress Notes (Signed)
Pt arrived to ATC for second betamethasone injection. Pt reports no problems with first injection. Pt tolerated second injection very well. Advised of daily FKC and signs of labor and to notify her ob with any concerns. IOL scheduled for 7/24. All questions answered regarding IOL. Pt left ATC in stable condition.

## 2020-10-25 ENCOUNTER — Encounter: Payer: Self-pay | Admitting: Obstetrics & Gynecology

## 2020-10-25 ENCOUNTER — Inpatient Hospital Stay
Admission: RE | Admit: 2020-10-25 | Discharge: 2020-10-28 | DRG: 560 | Disposition: A | Discharge status: Home / Self Care | Payer: Medicaid Other | Source: Ambulatory Visit | Attending: Obstetrics & Gynecology | Admitting: Obstetrics & Gynecology

## 2020-10-25 DIAGNOSIS — J45909 Unspecified asthma, uncomplicated: Secondary | ICD-10-CM | POA: Diagnosis present

## 2020-10-25 DIAGNOSIS — Z7951 Long term (current) use of inhaled steroids: Secondary | ICD-10-CM

## 2020-10-25 DIAGNOSIS — F419 Anxiety disorder, unspecified: Secondary | ICD-10-CM | POA: Diagnosis present

## 2020-10-25 DIAGNOSIS — Z3A37 37 weeks gestation of pregnancy: Secondary | ICD-10-CM

## 2020-10-25 DIAGNOSIS — O99344 Other mental disorders complicating childbirth: Secondary | ICD-10-CM | POA: Diagnosis present

## 2020-10-25 DIAGNOSIS — R52 Pain, unspecified: Secondary | ICD-10-CM

## 2020-10-25 DIAGNOSIS — Z8619 Personal history of other infectious and parasitic diseases: Secondary | ICD-10-CM

## 2020-10-25 DIAGNOSIS — O9952 Diseases of the respiratory system complicating childbirth: Principal | ICD-10-CM | POA: Diagnosis present

## 2020-10-25 HISTORY — DX: Bipolar disorder, unspecified: F31.9

## 2020-10-25 LAB — CBC AND DIFFERENTIAL
Absolute NRBC: 0 10*3/uL (ref 0.00–0.00)
Basophils Absolute Automated: 0.03 10*3/uL (ref 0.00–0.08)
Basophils Automated: 0.2 %
Eosinophils Absolute Automated: 0.03 10*3/uL (ref 0.00–0.44)
Eosinophils Automated: 0.2 %
Hematocrit: 32.3 % — ABNORMAL LOW (ref 34.7–43.7)
Hgb: 10.1 g/dL — ABNORMAL LOW (ref 11.4–14.8)
Immature Granulocytes Absolute: 0.15 10*3/uL — ABNORMAL HIGH (ref 0.00–0.07)
Immature Granulocytes: 1.1 %
Lymphocytes Absolute Automated: 3.37 10*3/uL — ABNORMAL HIGH (ref 0.42–3.22)
Lymphocytes Automated: 25.2 %
MCH: 25 pg — ABNORMAL LOW (ref 25.1–33.5)
MCHC: 31.3 g/dL — ABNORMAL LOW (ref 31.5–35.8)
MCV: 80 fL (ref 78.0–96.0)
MPV: 9.6 fL (ref 8.9–12.5)
Monocytes Absolute Automated: 1.04 10*3/uL — ABNORMAL HIGH (ref 0.21–0.85)
Monocytes: 7.8 %
Neutrophils Absolute: 8.76 10*3/uL — ABNORMAL HIGH (ref 1.10–6.33)
Neutrophils: 65.5 %
Nucleated RBC: 0 /100 WBC (ref 0.0–0.0)
Platelets: 265 10*3/uL (ref 142–346)
RBC: 4.04 10*6/uL (ref 3.90–5.10)
RDW: 15 % (ref 11–15)
WBC: 13.38 10*3/uL — ABNORMAL HIGH (ref 3.10–9.50)

## 2020-10-25 LAB — TYPE AND SCREEN
AB Screen Gel: NEGATIVE
ABO Rh: O POS

## 2020-10-25 MED ORDER — FENTANYL CITRATE (PF) 50 MCG/ML IJ SOLN (WRAP)
50.0000 ug | Freq: Once | INTRAMUSCULAR | Status: DC | PRN
Start: 2020-10-25 — End: 2020-10-26
  Filled 2020-10-25: qty 2

## 2020-10-25 MED ORDER — ONDANSETRON 4 MG PO TBDP
4.0000 mg | ORAL_TABLET | Freq: Three times a day (TID) | ORAL | Status: DC | PRN
Start: 2020-10-25 — End: 2020-10-26

## 2020-10-25 MED ORDER — VANCOMYCIN HCL IN NACL 2-0.9 GM/500ML-% IV SOLN
2000.0000 mg | Freq: Three times a day (TID) | INTRAVENOUS | Status: DC
Start: 2020-10-25 — End: 2020-10-26
  Administered 2020-10-25: 2000 mg via INTRAVENOUS
  Filled 2020-10-25 (×5): qty 500

## 2020-10-25 MED ORDER — ACETAMINOPHEN 325 MG PO TABS
650.0000 mg | ORAL_TABLET | Freq: Four times a day (QID) | ORAL | Status: DC | PRN
Start: 2020-10-25 — End: 2020-10-26
  Administered 2020-10-26: 650 mg via ORAL
  Filled 2020-10-25: qty 2

## 2020-10-25 MED ORDER — NALOXONE HCL 0.4 MG/ML IJ SOLN (WRAP)
0.2000 mg | INTRAMUSCULAR | Status: DC | PRN
Start: 2020-10-25 — End: 2020-10-26

## 2020-10-25 MED ORDER — PATIENT SUPPLIED NON FORMULARY
1.0000 | Status: DC | PRN
Start: 2020-10-25 — End: 2020-10-26

## 2020-10-25 MED ORDER — SODIUM CHLORIDE (PF) 0.9 % IJ SOLN
3.0000 mL | Freq: Three times a day (TID) | INTRAMUSCULAR | Status: DC
Start: 2020-10-25 — End: 2020-10-26
  Administered 2020-10-25 (×2): 3 mL via INTRAVENOUS

## 2020-10-25 MED ORDER — ONDANSETRON HCL 4 MG/2ML IJ SOLN
4.0000 mg | Freq: Three times a day (TID) | INTRAMUSCULAR | Status: DC | PRN
Start: 2020-10-25 — End: 2020-10-26
  Filled 2020-10-25: qty 2

## 2020-10-25 MED ORDER — FAMOTIDINE 10 MG/ML IV SOLN (WRAP)
20.0000 mg | Freq: Two times a day (BID) | INTRAVENOUS | Status: DC | PRN
Start: 2020-10-25 — End: 2020-10-26
  Administered 2020-10-25: 20 mg via INTRAVENOUS
  Filled 2020-10-25: qty 2

## 2020-10-25 MED ORDER — FLUTICASONE-SALMETEROL 500-50 MCG/ACT IN AEPB
1.0000 | INHALATION_SPRAY | Freq: Two times a day (BID) | RESPIRATORY_TRACT | Status: DC
Start: 2020-10-26 — End: 2020-10-26

## 2020-10-25 MED ORDER — ACETAMINOPHEN 650 MG RE SUPP
650.0000 mg | Freq: Four times a day (QID) | RECTAL | Status: DC | PRN
Start: 2020-10-25 — End: 2020-10-26

## 2020-10-25 MED ORDER — LACTATED RINGERS IV SOLN
INTRAVENOUS | Status: DC
Start: 2020-10-25 — End: 2020-10-26

## 2020-10-25 MED ORDER — FAMOTIDINE 20 MG PO TABS
20.0000 mg | ORAL_TABLET | Freq: Two times a day (BID) | ORAL | Status: DC | PRN
Start: 2020-10-25 — End: 2020-10-26

## 2020-10-25 NOTE — Progress Notes (Signed)
Dr. Trudee Kuster at bedside discussing plan of care. Pt concerned that she is GBS positive and discussing concerns about antibiotics and her allergies.Dr. Trudee Kuster discussing plan to treat with vancomycin and will discuss with pharmacy.

## 2020-10-25 NOTE — Progress Notes (Signed)
Report to L. Curto, RN care relinquished

## 2020-10-25 NOTE — Progress Notes (Signed)
Pt oriented to LDR 2207, monitors applied x2, history obtained, and plan of care discussed. Pt in agreement.

## 2020-10-26 ENCOUNTER — Encounter: Payer: Self-pay | Admitting: Obstetrics & Gynecology

## 2020-10-26 ENCOUNTER — Observation Stay: Payer: Medicaid Other | Admitting: Anesthesiology

## 2020-10-26 MED ORDER — SENNOSIDES-DOCUSATE SODIUM 8.6-50 MG PO TABS
1.0000 | ORAL_TABLET | Freq: Every evening | ORAL | Status: DC | PRN
Start: 2020-10-26 — End: 2020-10-28

## 2020-10-26 MED ORDER — HYDROCORTISONE 1 % EX OINT
TOPICAL_OINTMENT | Freq: Three times a day (TID) | CUTANEOUS | Status: DC | PRN
Start: 2020-10-26 — End: 2020-10-28

## 2020-10-26 MED ORDER — METHYLERGONOVINE MALEATE 0.2 MG PO TABS
0.2000 mg | ORAL_TABLET | Freq: Four times a day (QID) | ORAL | Status: DC | PRN
Start: 2020-10-26 — End: 2020-10-28

## 2020-10-26 MED ORDER — FLUTICASONE-SALMETEROL 500-50 MCG/ACT IN AEPB
1.0000 | INHALATION_SPRAY | Freq: Two times a day (BID) | RESPIRATORY_TRACT | Status: DC
Start: 2020-10-26 — End: 2020-10-26

## 2020-10-26 MED ORDER — MEASLES, MUMPS & RUBELLA VAC IJ SOLR
0.5000 mL | INTRAMUSCULAR | Status: DC | PRN
Start: 2020-10-26 — End: 2020-10-28

## 2020-10-26 MED ORDER — FENTANYL-BUPIVACAINE-NACL 0.2-0.125-0.9 MG/100ML-% EP SOLN
EPIDURAL | Status: DC
Start: 2020-10-26 — End: 2020-10-26

## 2020-10-26 MED ORDER — ALBUTEROL SULFATE HFA 108 (90 BASE) MCG/ACT IN AERS
2.0000 | INHALATION_SPRAY | RESPIRATORY_TRACT | Status: DC | PRN
Start: 2020-10-26 — End: 2020-10-28

## 2020-10-26 MED ORDER — BUPIVACAINE HCL (PF) 0.25 % IJ SOLN
INTRAMUSCULAR | Status: DC | PRN
Start: 2020-10-26 — End: 2020-10-27
  Administered 2020-10-26 (×2): 5 mL via EPIDURAL

## 2020-10-26 MED ORDER — OXYTOCIN-SODIUM CHLORIDE 30-0.9 UT/500ML-% IV SOLN
7.5000 [IU]/h | INTRAVENOUS | Status: DC
Start: 2020-10-26 — End: 2020-10-28

## 2020-10-26 MED ORDER — CETIRIZINE HCL 10 MG PO TABS
10.0000 mg | ORAL_TABLET | Freq: Every day | ORAL | Status: DC
Start: 2020-10-26 — End: 2020-10-28
  Administered 2020-10-26 – 2020-10-28 (×3): 10 mg via ORAL
  Filled 2020-10-26 (×3): qty 1

## 2020-10-26 MED ORDER — NALOXONE HCL 0.4 MG/ML IJ SOLN (WRAP)
0.1000 mg | INTRAMUSCULAR | Status: DC | PRN
Start: 2020-10-26 — End: 2020-10-26

## 2020-10-26 MED ORDER — BENZOCAINE 20% +/- MENTHOL 0.5% EX AERO (WRAP)
1.0000 | INHALATION_SPRAY | CUTANEOUS | Status: DC | PRN
Start: 2020-10-26 — End: 2020-10-28
  Administered 2020-10-26: 1 via TOPICAL
  Filled 2020-10-26: qty 56

## 2020-10-26 MED ORDER — NALOXONE HCL 0.4 MG/ML IJ SOLN (WRAP)
0.2000 mg | INTRAMUSCULAR | Status: DC | PRN
Start: 2020-10-26 — End: 2020-10-28

## 2020-10-26 MED ORDER — METHYLERGONOVINE MALEATE 0.2 MG/ML IJ SOLN
0.2000 mg | INTRAMUSCULAR | Status: DC | PRN
Start: 2020-10-26 — End: 2020-10-26

## 2020-10-26 MED ORDER — METOCLOPRAMIDE HCL 5 MG/ML IJ SOLN
10.0000 mg | Freq: Once | INTRAMUSCULAR | Status: DC | PRN
Start: 2020-10-26 — End: 2020-10-26

## 2020-10-26 MED ORDER — NIFEDIPINE 10 MG PO CAPS
10.0000 mg | ORAL_CAPSULE | Freq: Once | ORAL | Status: DC | PRN
Start: 2020-10-26 — End: 2020-10-28

## 2020-10-26 MED ORDER — SOD CITRATE-CITRIC ACID 500-334 MG/5ML PO SOLN
30.0000 mL | Freq: Once | ORAL | Status: DC | PRN
Start: 2020-10-26 — End: 2020-10-26

## 2020-10-26 MED ORDER — ONDANSETRON 4 MG PO TBDP
4.0000 mg | ORAL_TABLET | Freq: Three times a day (TID) | ORAL | Status: DC | PRN
Start: 2020-10-26 — End: 2020-10-28

## 2020-10-26 MED ORDER — WITCH HAZEL EX PADS (WRAP)
1.0000 | MEDICATED_PAD | CUTANEOUS | Status: DC | PRN
Start: 2020-10-26 — End: 2020-10-28
  Administered 2020-10-26: 1 via TOPICAL
  Filled 2020-10-26: qty 40

## 2020-10-26 MED ORDER — DOCUSATE SODIUM 100 MG PO CAPS
100.0000 mg | ORAL_CAPSULE | Freq: Two times a day (BID) | ORAL | Status: DC
Start: 2020-10-26 — End: 2020-10-28
  Administered 2020-10-27 – 2020-10-28 (×4): 100 mg via ORAL
  Filled 2020-10-26 (×4): qty 1

## 2020-10-26 MED ORDER — ACETAMINOPHEN 325 MG PO TABS
650.0000 mg | ORAL_TABLET | Freq: Four times a day (QID) | ORAL | Status: DC | PRN
Start: 2020-10-26 — End: 2020-10-28
  Administered 2020-10-26 – 2020-10-27 (×3): 650 mg via ORAL
  Filled 2020-10-26 (×3): qty 2

## 2020-10-26 MED ORDER — NIFEDIPINE 10 MG PO CAPS
20.0000 mg | ORAL_CAPSULE | Freq: Once | ORAL | Status: DC | PRN
Start: 2020-10-26 — End: 2020-10-28

## 2020-10-26 MED ORDER — NALOXONE HCL 0.4 MG/ML IJ SOLN (WRAP)
0.2000 mg | INTRAMUSCULAR | Status: DC | PRN
Start: 2020-10-26 — End: 2020-10-26

## 2020-10-26 MED ORDER — SIMETHICONE 80 MG PO CHEW
80.0000 mg | CHEWABLE_TABLET | Freq: Four times a day (QID) | ORAL | Status: DC | PRN
Start: 2020-10-26 — End: 2020-10-28

## 2020-10-26 MED ORDER — PATIENT SUPPLIED NON FORMULARY
1.0000 | Freq: Two times a day (BID) | Status: DC
Start: 2020-10-26 — End: 2020-10-28
  Administered 2020-10-26 – 2020-10-28 (×4): 1 via RESPIRATORY_TRACT

## 2020-10-26 MED ORDER — ALBUTEROL SULFATE HFA 108 (90 BASE) MCG/ACT IN AERS
2.0000 | INHALATION_SPRAY | Freq: Three times a day (TID) | RESPIRATORY_TRACT | Status: DC
Start: 2020-10-26 — End: 2020-10-26
  Filled 2020-10-26: qty 8

## 2020-10-26 MED ORDER — LANOLIN EX OINT
TOPICAL_OINTMENT | CUTANEOUS | Status: DC | PRN
Start: 2020-10-26 — End: 2020-10-28
  Filled 2020-10-26: qty 7

## 2020-10-26 MED ORDER — LACTATED RINGERS IV BOLUS
1000.0000 mL | Freq: Once | INTRAVENOUS | Status: AC
Start: 2020-10-26 — End: 2020-10-26
  Administered 2020-10-26: 1000 mL via INTRAVENOUS

## 2020-10-26 MED ORDER — ALBUTEROL SULFATE HFA 108 (90 BASE) MCG/ACT IN AERS
1.0000 | INHALATION_SPRAY | RESPIRATORY_TRACT | Status: DC | PRN
Start: 2020-10-26 — End: 2020-10-26

## 2020-10-26 MED ORDER — OXYTOCIN-SODIUM CHLORIDE 30-0.9 UT/500ML-% IV SOLN
2.0000 m[IU]/min | INTRAVENOUS | Status: DC | PRN
Start: 2020-10-26 — End: 2020-10-26
  Administered 2020-10-26: 2 m[IU]/min via INTRAVENOUS
  Administered 2020-10-26: 999 m[IU]/min via INTRAVENOUS
  Filled 2020-10-26 (×2): qty 500

## 2020-10-26 MED ORDER — ONDANSETRON HCL 4 MG/2ML IJ SOLN
4.0000 mg | Freq: Three times a day (TID) | INTRAMUSCULAR | Status: DC | PRN
Start: 2020-10-26 — End: 2020-10-28

## 2020-10-26 MED ORDER — METHYLERGONOVINE MALEATE 0.2 MG/ML IJ SOLN
0.2000 mg | Freq: Four times a day (QID) | INTRAMUSCULAR | Status: DC | PRN
Start: 2020-10-26 — End: 2020-10-28

## 2020-10-26 MED ORDER — FENTANYL-BUPIVACAINE-NACL 0.2-0.125-0.9 MG/100ML-% EP SOLN
EPIDURAL | Status: DC
Start: 2020-10-26 — End: 2020-10-26
  Administered 2020-10-26: 12 mL/h via EPIDURAL
  Filled 2020-10-26: qty 100

## 2020-10-26 MED ORDER — AMMONIA AROMATIC IN INHA
1.0000 | Freq: Once | RESPIRATORY_TRACT | Status: DC | PRN
Start: 2020-10-26 — End: 2020-10-28

## 2020-10-26 MED ORDER — OXYTOCIN-SODIUM CHLORIDE 30-0.9 UT/500ML-% IV SOLN
7.5000 [IU]/h | INTRAVENOUS | Status: DC | PRN
Start: 2020-10-27 — End: 2020-10-26

## 2020-10-26 MED ORDER — EPHEDRINE SULFATE 50 MG/ML IJ/IV SOLN (WRAP)
10.0000 mg | Status: DC | PRN
Start: 2020-10-26 — End: 2020-10-26

## 2020-10-26 MED ORDER — EPHEDRINE SULFATE 50 MG/ML IJ/IV SOLN (WRAP)
10.0000 mg | Freq: Once | Status: DC | PRN
Start: 2020-10-26 — End: 2020-10-26
  Filled 2020-10-26: qty 1

## 2020-10-26 MED ORDER — IBUPROFEN 600 MG PO TABS
600.0000 mg | ORAL_TABLET | Freq: Four times a day (QID) | ORAL | Status: DC
Start: 2020-10-26 — End: 2020-10-28
  Administered 2020-10-26 – 2020-10-28 (×8): 600 mg via ORAL
  Filled 2020-10-26 (×7): qty 1

## 2020-10-26 MED ORDER — MISOPROSTOL 200 MCG PO TABS
800.0000 ug | ORAL_TABLET | Freq: Once | ORAL | Status: DC | PRN
Start: 2020-10-26 — End: 2020-10-28

## 2020-10-26 MED ORDER — PROMETHAZINE HCL 12.5 MG RE SUPP
12.5000 mg | Freq: Four times a day (QID) | RECTAL | Status: DC | PRN
Start: 2020-10-26 — End: 2020-10-28

## 2020-10-26 MED ORDER — TETANUS-DIPHTH-ACELL PERTUSSIS 5-2.5-18.5 LF-MCG/0.5 IM SUSP
0.5000 mL | INTRAMUSCULAR | Status: DC | PRN
Start: 2020-10-26 — End: 2020-10-28

## 2020-10-26 MED ORDER — FLUTICASONE-SALMETEROL 500-50 MCG/ACT IN AEPB
1.0000 | INHALATION_SPRAY | Freq: Two times a day (BID) | RESPIRATORY_TRACT | Status: DC
Start: 2020-10-26 — End: 2020-10-26
  Administered 2020-10-26: 1 via RESPIRATORY_TRACT

## 2020-10-26 MED ORDER — SODIUM CHLORIDE 0.9 % IV SOLN
6.2500 mg | Freq: Four times a day (QID) | INTRAVENOUS | Status: DC | PRN
Start: 2020-10-26 — End: 2020-10-28

## 2020-10-26 MED ORDER — FAMOTIDINE 10 MG/ML IV SOLN (WRAP)
20.0000 mg | Freq: Once | INTRAVENOUS | Status: DC | PRN
Start: 2020-10-26 — End: 2020-10-26

## 2020-10-26 MED ORDER — BISACODYL 10 MG RE SUPP
10.0000 mg | Freq: Every day | RECTAL | Status: DC | PRN
Start: 2020-10-26 — End: 2020-10-28

## 2020-10-26 MED ORDER — LIDOCAINE-EPINEPHRINE 1.5 %-1:200000 IJ SOLN
INTRAMUSCULAR | Status: DC | PRN
Start: 2020-10-26 — End: 2020-10-27
  Administered 2020-10-26: 3 mL via EPIDURAL
  Administered 2020-10-26: 2 mL via EPIDURAL

## 2020-10-26 MED ORDER — VITAMIN D3 25 MCG (1000 UT) PO TABS
25.0000 ug | ORAL_TABLET | Freq: Every day | ORAL | Status: DC
Start: 2020-10-26 — End: 2020-10-28
  Administered 2020-10-27 – 2020-10-28 (×2): 25 ug via ORAL
  Filled 2020-10-26 (×2): qty 1

## 2020-10-26 MED ORDER — PRENATAL PLUS IRON 29-1 MG PO TABS
1.0000 | ORAL_TABLET | Freq: Every day | ORAL | Status: DC
Start: 2020-10-26 — End: 2020-10-28
  Administered 2020-10-27 – 2020-10-28 (×2): 1 via ORAL
  Filled 2020-10-26 (×2): qty 1

## 2020-10-26 MED ORDER — IBUPROFEN 600 MG PO TABS
600.0000 mg | ORAL_TABLET | Freq: Once | ORAL | Status: AC | PRN
Start: 2020-10-26 — End: 2020-10-26
  Administered 2020-10-26: 600 mg via ORAL
  Filled 2020-10-26 (×2): qty 1

## 2020-10-26 MED ORDER — PROMETHAZINE HCL 12.5 MG PO TABS
12.5000 mg | ORAL_TABLET | Freq: Four times a day (QID) | ORAL | Status: DC | PRN
Start: 2020-10-26 — End: 2020-10-28

## 2020-10-26 MED ORDER — BUPIVACAINE HCL (PF) 0.25 % IJ SOLN
INTRAMUSCULAR | Status: AC
Start: 2020-10-26 — End: 2020-10-26
  Filled 2020-10-26: qty 30

## 2020-10-26 NOTE — Progress Notes (Signed)
Positioned for epidural, FHR not tracing.

## 2020-10-26 NOTE — Progress Notes (Signed)
Pt called out feeling pressure and sensation baby is falling out. Arrived in room and head between legs, after one more push baby out in bed.

## 2020-10-26 NOTE — Progress Notes (Signed)
Admitted to [facility] room 4206 from L&D after SVD. Pt., aunt and FOB oriented to room, call light, telephone and how to reach staff. Discussed POC including PP and newborn. Newborn to nursery to draw lab work.

## 2020-10-26 NOTE — Plan of Care (Signed)
Problem: Vaginal/Cesarean Delivery  Goal: Evidence of Fetal Well Being  Outcome: Progressing  Flowsheets (Taken 10/26/2020 0012)  Evidence of fetal well being: Position patient for maximum uterine perfusion  Note: EFM and TOCO applied for continuous monitoring. Pt to right side lying.

## 2020-10-26 NOTE — Progress Notes (Signed)
Cook cath fell out while pt up to BR, catheter intact.

## 2020-10-26 NOTE — Anesthesia Preprocedure Evaluation (Addendum)
Anesthesia Evaluation    AIRWAY    Mallampati: II    TM distance: >3 FB  Neck ROM: full  Mouth Opening:full   CARDIOVASCULAR    cardiovascular exam normal       DENTAL    no notable dental hx     PULMONARY    pulmonary exam normal     OTHER FINDINGS                Relevant Problems   PULMONARY   (+) SOB (shortness of breath)               Anesthesia Plan    ASA 3     epidural                                 informed consent obtained                   Signed by: Kym Groom, MD 10/26/20 6:27 AM

## 2020-10-26 NOTE — Progress Notes (Signed)
Pt up to BR, unable to trace FHR during this time.

## 2020-10-26 NOTE — Anesthesia Procedure Notes (Signed)
Epidural    Patient location during procedure: L&D  Reason for block: Labor or C-section  Block at Surgeon's request: Yes    Start time: 10/26/2020 7:15 AM  End time: 10/26/2020 7:35 AM    Staffing  Performed: resident/CRNA   Anesthesiologist: Ginette Otto, MD  Resident/CRNA: Joni Fears, CRNA    Pre-procedure Checklist   Completed: patient identified, surgical consent, pre-op evaluation, timeout performed, risks and benefits discussed, monitors and equipment checked, anesthesia consent given and correct site  Timeout Completed:  10/26/2020 7:10 AM    Epidural  Patient monitoring: NIBP and Pulse oximetry    Premedication: Meaningful Contact Maintained  Patient position: sitting    Skin Local: lidocaine 1%  Dose: 5 mL    Attempts  Number of attempts: 1                    Successful attempt  Interspace: L3-4  Approach: midline    Needle type: Touhy needle   Needle gauge: 17  Injection technique: LOR air  Epidural Space ID: 7 cm  CSF Return: No   Blood Return: No  Paresthesia Pain: No    Needle Placement  Needle type: Touhy needle   Needle gauge: 17  Injection technique: LOR air  CSF Return: No  Blood Return: No          Paresthesia Pain: No    Catheter Placement   Catheter type: side hole  Catheter size: 19 G  Catheter at skin depth: 12 cm  CSF Return: No  Blood Return: No  Test Dose:1.5 % lidocaine and negative  3 cc  Incremental injection: yes  Injection made incrementally with aspirations every 5 mL.    No Catheter IV/SA Signs or Symptoms    Assessment   Sensory level: T10  Patient tolerated procedure well: Yes  Block Outcome: no complications and successful block

## 2020-10-26 NOTE — Plan of Care (Signed)
Problem: Safety  Goal: Patient will be free from injury during hospitalization  Outcome: Progressing  Goal: Patient will be free from infection during hospitalization  Outcome: Progressing     Problem: Pain  Goal: Pain at adequate level as identified by patient  Outcome: Progressing     Problem: Side Effects from Pain Analgesia  Goal: Patient will experience minimal side effects of analgesic therapy  Outcome: Progressing     Problem: Discharge Barriers  Goal: Patient will be discharged home or other facility with appropriate resources  Outcome: Progressing     Problem: Psychosocial and Spiritual Needs  Goal: Demonstrates ability to cope with hospitalization/illness  Outcome: Progressing     Problem: Vaginal/Cesarean Delivery  Goal: Evidence of Fetal Well Being  Outcome: Progressing

## 2020-10-26 NOTE — Progress Notes (Signed)
Pt requesting epidural, IV bolus started. OB aware, anesthesia called - will review pt chart and place orders.

## 2020-10-26 NOTE — Plan of Care (Signed)
Problem: Safety  Goal: Patient will be free from injury during hospitalization  Outcome: Progressing  Goal: Patient will be free from infection during hospitalization  Outcome: Progressing     Problem: Pain  Goal: Pain at adequate level as identified by patient  Outcome: Progressing

## 2020-10-26 NOTE — Progress Notes (Signed)
Pt sitting for epidural, RN at bedside. EFM removed due to extreme difficulty tracing while pt sitting, will reapply post epidural .   SBAR to Lauren RN, care relinquished at this time.

## 2020-10-26 NOTE — H&P (Unsigned)
Patient Type: V     ATTENDING PHYSICIAN: Janifer Adie, MD     HISTORY OF PRESENT ILLNESS:  The patient presents for scheduled induction.     PAST OBSTETRICAL HISTORY:  NSVD x3, SAB x1.     PAST SURGICAL HISTORY:  In 2021, hernia repair.     PAST MEDICAL HISTORY:  Anxiety, depression; asthma; genital herpes simplex; migraines.     ALLERGIES:  Multiple allergies.  PENICILLINS; CECLOR; LATEX; MACRODANTIN; ZYRTEC D;  SULFA; ACYCLOVIR; CLINDAMYCIN.       PHYSICAL EXAMINATION:  The patient is 50% effaced, 0.5 to 1 cm dilated, and -3 to -4 station.     ASSESSMENT AND PLAN:  This is a 30 year old G5, P3-0-1-3 at 37 weeks and 0 days who comes for an  induction.  Her pulmonologist  believes she should be delivered earlier due  to her lung function.  GBS negative.   Other complications of this  pregnancy are anxiety for which she takes 25 mg of Zoloft and herpes and  reducible umbilical hernia and asthma.     Today, we examined the patient.  A sterile speculum exam showed no lesions  in the vagina and also visually no lesions on the vulva and introital area.     We will admit and anticipate vaginal delivery.  Place Cook catheter due to  her history of asthma and we will anticipate vaginal delivery.           D:  10/25/2020 23:23 PM by Dr. Trisha Mangle, MD [phone])  T:  10/26/2020 04:59 AM by NTS      (Conf: [id number]) (Doc ID: [id number])

## 2020-10-26 NOTE — UM Notes (Signed)
Primary Coverage: MEDICAID HMO/[employer]EEPERS PLUS MEDICAID    Copy of Admission Order:  ADMIT TO OBSERVATION (OUTPATIENT WITH OBSERVATION SERVICES) (Order [id number]) 10/25/20 2125  ADMIT TO INPATIENT (Order [id number]) 10/26/20 1100  Diagnosis: [redacted] Weeks Gestation Of Pregnancy   Level of Care: Intermediate Care   Patient Class: Inpatient   References:    [facility] Bed Placement Criteria    [facility] Bed Placement Criteria    [facility] Bed Placement Criteria    [facility] Bed Placement Criteria    [facility] Bed Placement Criteria  Question Answer Comment  Admitting Physician Trudee Kuster, ROOPA M   Service: OB Post Partum   Estimated Length of Stay > or = to 2 midnights   Tentative Discharge Plan? Home or Self Care       DIAGNOSIS    ICD-10-CM    1. [redacted] weeks gestation of pregnancy  Z3A.37         ADMITTED ON: 10/25/2020 at 1927 for Inpatient    PMH:  has a past medical history of Anesthesia complication, Anxiety, Asthma, Bipolar 1 disorder, Chronic pain, COVID-19 (04/2020), Depression, Dysphagia, Ear, nose and throat disorder, Fracture, Gastroesophageal reflux disease, Head injury, Hearing loss, Heart murmur, Hernia, umbilical, Herpes, History of mixed drug abuse, HPV in female (2013), Hypertension, Low back pain, Malignant neoplasm, Migraine, Other and unspecified ovarian cysts, Ovarian cyst, Pollen allergies, Post-operative nausea and vomiting, Postpartum depression, PTSD (post-traumatic stress disorder), and Urinary tract infection.    Reason For Hospitalization:    Pt is a 30 y.o. female  [id number] at [redacted]w[redacted]d weeks who was admitted for induction of labor at term.    Scheduled Induction (IOL for severe asthma)      Vitals:   Patient Vitals for the past 24 hrs:   BP Temp Temp src Pulse Resp SpO2 Height Weight   10/26/20 1720 122/83 98.4 F (36.9 C) Oral 78 17 -- -- --   10/26/20 1315 118/76 98.1 F (36.7 C) Oral 81 20 -- -- --   10/26/20 1151 112/77 98.4 F (36.9 C) Oral 84 20 -- -- --   10/26/20 1100 107/65 -- -- 91 -- -- -- --   10/26/20 1030  109/56 -- -- 84 -- -- -- --   10/26/20 1025 96/62 -- -- 89 -- -- -- --   10/26/20 1000 124/66 -- -- 93 -- -- -- --   10/26/20 0930 121/69 98 F (36.7 C) -- 84 16 98 % -- --   10/26/20 0900 -- -- -- -- -- 96 % -- --   10/26/20 0845 -- -- -- -- -- 94 % -- --   10/26/20 0800 -- -- -- -- -- 94 % -- --   10/26/20 0750 117/71 -- -- 89 -- 96 % -- --   10/26/20 0744 116/66 -- -- 83 -- -- -- --   10/26/20 0740 122/65 -- -- 86 -- 96 % -- --   10/26/20 0730 -- -- -- -- -- 94 % -- --   10/26/20 0725 -- -- -- -- -- 97 % -- --   10/26/20 0720 -- -- -- -- -- 94 % -- --   10/26/20 0718 (!) 139/96 -- -- 92 -- -- -- --   10/26/20 0545 122/71 98.1 F (36.7 C) Oral 75 -- -- -- --   10/26/20 0515 118/58 -- -- 81 -- -- -- --   10/26/20 0500 110/65 -- -- 78 -- -- -- --   10/26/20 0445 109/65 -- -- 85 -- -- -- --  10/26/20 0400 106/65 -- -- 73 -- -- -- --   10/26/20 0345 113/77 -- -- 81 -- -- -- --   10/26/20 0330 116/80 -- -- 83 -- -- -- --   10/26/20 0252 133/78 98.2 F (36.8 C) Oral 84 -- -- -- --   10/25/20 2353 120/73 98.8 F (37.1 C) Oral 82 16 -- -- --   10/25/20 2201 139/80 -- -- 83 -- -- -- --   10/25/20 2018 -- -- -- -- -- -- 1.676 m (5\' 6" ) 131.5 kg (290 lb)   10/25/20 2000 121/63 98.8 F (37.1 C) -- 84 -- -- -- --         Temp:  [98 F (36.7 C)-98.8 F (37.1 C)]   Heart Rate:  [73-93]   Resp Rate:  [16-20]   BP: (96-139)/(56-96)   SpO2:  [94 %-98 %]   Height:  [167.6 cm (5\' 6" )]   Weight:  [131.5 kg (290 lb)]   BMI (calculated):  [46.9]     Last recorded pain score:  Pain Scale Used: Numeric Scale (0-10)  Pain Score: 0-No pain       Abnormal Labs:    Lab Results last 48 Hours       Procedure Component Value Units Date/Time    Type and Screen [[id number]] Collected: 10/25/20 2142    Specimen: Blood Updated: 10/25/20 2225     ABO Rh O POS     AB Screen Gel NEG    CBC and differential [[id number]]  (Abnormal) Collected: 10/25/20 2142    Specimen: Blood Updated: 10/25/20 2151     WBC 13.38 x10 3/uL      Hgb 10.1 g/dL       Hematocrit 78.2 %      MCH 25.0 pg      MCHC 31.3 g/dL      Neutrophils Absolute 8.76 x10 3/uL      Lymphocytes Absolute Automated 3.37 x10 3/uL      Monocytes Absolute Automated 1.04 x10 3/uL      Immature Granulocytes Absolute 0.15 x10 3/uL     GBS Transcribed [[id number]] Resulted: 07/01/20     Updated: 10/25/20 2110     GBS Transcribed Positive-Urine            NOTIFICATION OF INFANT DELIVERY AND/OR ROUTINE NEWBORN CARE     DOB and time: 10/26/2020  9:15 AM  Name: ORLANDO, THALMANN  Apgars: 9/9  Type of Delivery: Vaginal, Spontaneous    Hgt at birth:        Ht Readings from Last 1 Encounters:   10/26/20 19.29" (49 cm) (47 %, Z= -0.08)*      * Growth percentiles are based on WHO (Girls, 0-2 years) data.      Weight at birth: 2.69 kg (5 lb 14.9 oz)  Gestational Age: Gestational Age: [redacted]w[redacted]d             Treatment Plan:   H&P 10/25/2020  ASSESSMENT AND PLAN:  This is a 30 year old G5, P3-0-1-3 at 37 weeks and 0 days who comes for an  induction.  Her pulmonologist  believes she should be delivered earlier due  to her lung function.  GBS negative.   Other complications of this  pregnancy are anxiety for which she takes 25 mg of Zoloft and herpes and  reducible umbilical hernia and asthma.  Today, we examined the patient.  A sterile speculum exam showed no lesions  in the vagina and also visually no lesions on the vulva  and introital area.  We will admit and anticipate vaginal delivery.  Place Cook catheter due to  her history of asthma and we will anticipate vaginal delivery.           Orders    Scheduled Meds:  Current Facility-Administered Medications   Medication Dose Route Frequency    cetirizine  10 mg Oral Daily    docusate sodium  100 mg Oral BID    ibuprofen  600 mg Oral Q6H    NON-FORMULARY PAT OWN MED order form  1 puff Inhalation BID    prenatal vitamin  1 tablet Oral Daily    vitamin D (cholecalciferol)  25 mcg Oral Daily     Continuous Infusions:   oxytocin       PRN Meds:.acetaminophen, albuterol sulfate  HFA, ammonia, benzocaine-lanolin-aloe vera, bisacodyl, hydrocortisone, lanolin, measles, mumps & rubella vaccine, methylergonovine **OR** methylergonovine, miSOPROStol, naloxone, NIFEdipine, NIFEdipine, ondansetron **OR** ondansetron, promethazine **OR** promethazine (PHENERGAN) IVPB **OR** promethazine, senna-docusate, simethicone, TdaP Booster, witch hazel        This clinical review is based on compiled documentation provided by the treatment team within the patient's medical record.  ________________________________________      UTILIZATION REVIEW CONTACT :   Ronalee Belts, MSN, RN, ACM  Utilization Review Case Manager ll   Phone: [phone] (vm only)  Main Line: [phone]  Fax Line [phone]  Cell [phone] (8a-5p)  [email or web address]

## 2020-10-26 NOTE — Progress Notes (Signed)
Pt up to BR, unable to tracing FHR during this time. Will readjust monitor when finished.

## 2020-10-27 LAB — CBC
Absolute NRBC: 0 10*3/uL (ref 0.00–0.00)
Hematocrit: 29.5 % — ABNORMAL LOW (ref 34.7–43.7)
Hgb: 9.4 g/dL — ABNORMAL LOW (ref 11.4–14.8)
MCH: 25.1 pg (ref 25.1–33.5)
MCHC: 31.9 g/dL (ref 31.5–35.8)
MCV: 78.9 fL (ref 78.0–96.0)
MPV: 10 fL (ref 8.9–12.5)
Nucleated RBC: 0 /100 WBC (ref 0.0–0.0)
Platelets: 241 10*3/uL (ref 142–346)
RBC: 3.74 10*6/uL — ABNORMAL LOW (ref 3.90–5.10)
RDW: 15 % (ref 11–15)
WBC: 13.64 10*3/uL — ABNORMAL HIGH (ref 3.10–9.50)

## 2020-10-27 MED ORDER — IBUPROFEN 600 MG PO TABS
600.0000 mg | ORAL_TABLET | Freq: Four times a day (QID) | ORAL | 0 refills | Status: DC
Start: 2020-10-27 — End: 2021-12-31

## 2020-10-27 MED ORDER — AZITHROMYCIN 250 MG PO TABS
250.0000 mg | ORAL_TABLET | Freq: Every day | ORAL | Status: DC
Start: 2020-10-27 — End: 2020-10-28
  Administered 2020-10-27 – 2020-10-28 (×2): 250 mg via ORAL
  Filled 2020-10-27 (×3): qty 1

## 2020-10-27 MED ORDER — AZITHROMYCIN 250 MG PO TABS
250.0000 mg | ORAL_TABLET | Freq: Every day | ORAL | 0 refills | Status: AC
Start: 2020-10-27 — End: 2020-10-30

## 2020-10-27 NOTE — Progress Notes (Signed)
PROGRESS NOTE    Date Time: 10/27/20 7:36 AM  Patient Name: Catherine Spence      Assessment:   PPD #1 s/p NSVD    Plan:   Routine pp care     Subjective:   No complaints.    Medications:     Current Facility-Administered Medications   Medication Dose Route Frequency    cetirizine  10 mg Oral Daily    docusate sodium  100 mg Oral BID    ibuprofen  600 mg Oral Q6H    NON-FORMULARY PAT OWN MED order form  1 puff Inhalation BID    prenatal vitamin  1 tablet Oral Daily    vitamin D (cholecalciferol)  25 mcg Oral Daily       Review of Systems:   A comprehensive review of systems was: Negative except cramping    Physical Exam:     Vitals:    10/27/20 0521   BP: 126/82   Pulse: 94   Resp: 16   Temp: 97.9 F (36.6 C)   SpO2:        Intake and Output Summary (Last 24 hours) at Date Time    Intake/Output Summary (Last 24 hours) at 10/27/2020 0736  Last data filed at 10/26/2020 1713  Gross per 24 hour   Intake --   Output 1100 ml   Net -1100 ml       General appearance - alert, well appearing, and in no distress    Labs:     Results       Procedure Component Value Units Date/Time    CBC without differential [[id number]]  (Abnormal) Collected: 10/27/20 0648     Updated: 10/27/20 0734     WBC 13.64 x10 3/uL      Hgb 9.4 g/dL      Hematocrit 40.9 %      Platelets 241 x10 3/uL      RBC 3.74 x10 6/uL      MCV 78.9 fL      MCH 25.1 pg      MCHC 31.9 g/dL      RDW 15 %      MPV 10.0 fL      Nucleated RBC 0.0 /100 WBC      Absolute NRBC 0.00 x10 3/uL             Recent CBC   Recent Labs     10/27/20  0648   RBC 3.74*   Hgb 9.4*   Hematocrit 29.5*   MCV 78.9   MCH 25.1   MCHC 31.9   RDW 15   MPV 10.0       Rads:   Radiological Procedure reviewed.    Signed by: Darrow Bussing, MD

## 2020-10-27 NOTE — Anesthesia Postprocedure Evaluation (Addendum)
Anesthesia Postpartum Evaluation (Vaginal Delivery)    Catherine Spence seen and examined. Anesthesia record reviewed. No anesthesia-related complications    Elements of Post-Anesthesia Evaluation:  Marland Kitchen Vitals: BP 133/88   Pulse 81   Temp 36.7 C (98.1 F) (Oral)   Resp 16   Ht 1.676 m (5\' 6" )   Wt 131.5 kg (290 lb)   LMP 02/27/2020   SpO2 98%   Breastfeeding Unknown   BMI 46.81 kg/m   . Level of Consciousness: awake and alert  . Pain Management: adequate  . Airway Patency: patent  . Anesthetic complications: No    . PONV Status: none  . Cardiovascular status: acceptable  . Respiratory status: acceptable  . Hydration status: acceptable    Assessment and Plan:   Status post neuraxial analgesic for labor and delivery, with no assessed or reported anesthetic complications.   All questions and concerns addressed. Patient satisfied with anesthetic care.   Signing off; reconsult PRN.

## 2020-10-27 NOTE — Plan of Care (Signed)
Problem: Safety  Goal: Patient will be free from injury during hospitalization  Outcome: Progressing  Goal: Patient will be free from infection during hospitalization  Outcome: Progressing     Problem: Pain  Goal: Pain at adequate level as identified by patient  Outcome: Progressing     Problem: Side Effects from Pain Analgesia  Goal: Patient will experience minimal side effects of analgesic therapy  Outcome: Progressing     Problem: Discharge Barriers  Goal: Patient will be discharged home or other facility with appropriate resources  Outcome: Progressing     Problem: Psychosocial and Spiritual Needs  Goal: Demonstrates ability to cope with hospitalization/illness  Outcome: Progressing     Problem: Vaginal/Cesarean Delivery  Goal: Evidence of Fetal Well Being  Outcome: Progressing

## 2020-10-28 NOTE — Discharge Instr - AVS First Page (Addendum)
After Your Delivery Discharge Instructions    After Discharge Orders:  Call office for a follow-up appointment      Current Discharge Medication List        START taking these medications    Details   azithromycin (ZITHROMAX) 250 MG tablet Take 1 tablet (250 mg total) by mouth daily for 3 days  Qty: 3 tablet, Refills: 0      ibuprofen (ADVIL) 600 MG tablet Take 1 tablet (600 mg total) by mouth every 6 (six) hours  Qty: 25 tablet, Refills: 0           CONTINUE these medications which have NOT CHANGED    Details   Advair Diskus 250-50 MCG/DOSE Aerosol Pwdr, Breath Activated Inhale 1 puff into the lungs 2 (two) times daily      Nebulizer Misc Dispense one nebulizer machine to include mask and tubing  Qty: 1 each, Refills: 0      Prenatal MV-Min-Fe Fum-FA-DHA (PRENATAL 1 PO) Take by mouth      Ventolin HFA 108 (90 Base) MCG/ACT inhaler Inhale 2 puffs into the lungs every 4 (four) hours as needed      EPINEPHrine 0.3 MG/0.3ML Solution Auto-injector injection Inject 0.3 mLs (0.3 mg total) into the muscle as needed (for Anaphylaxis)  Qty: 2 each, Refills: 0      UNKNOWN TO PATIENT STOOL SOFTENER + LAXATIVE  ALTERNATES WITH LAXATIVE                 Call the Physician with any questions ?  [phone]          After your delivery - signs and symptoms to CALL  for:  Fever - Oral temperature greater than 100.4 degrees Fahrenheit  Foul-smelling vaginal discharge  Headache unrelieved by "pain meds"  Difficulty urinating  Breasts :REDNESS OF THE NIPPLE IS  NORMAL  REDNESS  ANY WHERE ELSE ON THE BREAST  MAY BE A  SIGN OF  MASTITIS  AND YOU SHOULD CALL       especially if it is associate with fever  chills or malaise  Nipple discharge which is foul-smelling or contains pus  Increased pain in the  Vaginal area   Difficulty breathing with or without chest pain  New calf pain especially if only on one side  Sudden, continuing increased vaginal bleeding with or without clots  Unrelieved feelings of:  Inability to  cope  Sadness  Anxiety  Lack of interest in baby  Insomnia  Crying     What to do at home:  See patient education handouts for full information  Resume activity gradually   Don't lift anything heavier than baby and carrier until OK'd by your Physician or Midwife  No sex until OK'd by your Physician or Midwife  Take care of yourself by sleeping/resting as much as possible  Eat regular nutritious meals  Let someone else care for you, your baby, and housework as much as possible  so you can get some  rest.  You will need it to recover. YOu have to take care of yourself to be a good mom.  Take pain medication as prescribed whenever you need them  Wear compression stockings if prescribed   To avoid/relieve constipation take stool softeners if advised   Drink lots of water/fruit juices  Increase fiber in your diet  Breast care: Wear support bra       Refer to Newborn Discharge Instructions for problems

## 2020-10-28 NOTE — Progress Notes (Signed)
Patient is stable and ready for discharge. Reviewed discharge folder with patient, questions and concerns were addressed by RN. Discussed when to contact provider for further questions and concerns , patient verbalizes understanding. All patient belongings in patient possession. Prescriptions electronically sent to patient's verified pharmacy on record. Patient escorted out via wheelchair with RN.

## 2020-10-28 NOTE — Plan of Care (Signed)
Pt is recovering well and bonding with the baby. Will continue to assess and observe.    Problem: Safety  Goal: Patient will be free from injury during hospitalization  Outcome: Progressing  Goal: Patient will be free from infection during hospitalization  Outcome: Progressing     Problem: Pain  Goal: Pain at adequate level as identified by patient  Outcome: Progressing     Problem: Side Effects from Pain Analgesia  Goal: Patient will experience minimal side effects of analgesic therapy  Outcome: Progressing     Problem: Discharge Barriers  Goal: Patient will be discharged home or other facility with appropriate resources  Outcome: Progressing     Problem: Psychosocial and Spiritual Needs  Goal: Demonstrates ability to cope with hospitalization/illness  Outcome: Progressing     Problem: Vaginal/Cesarean Delivery  Goal: Evidence of Fetal Well Being  Outcome: Progressing

## 2020-10-28 NOTE — Plan of Care (Signed)
Problem: Safety  Goal: Patient will be free from injury during hospitalization  Outcome: Adequate for Discharge  Goal: Patient will be free from infection during hospitalization  Outcome: Adequate for Discharge     Problem: Pain  Goal: Pain at adequate level as identified by patient  Outcome: Adequate for Discharge     Problem: Side Effects from Pain Analgesia  Goal: Patient will experience minimal side effects of analgesic therapy  Outcome: Adequate for Discharge     Problem: Discharge Barriers  Goal: Patient will be discharged home or other facility with appropriate resources  Outcome: Adequate for Discharge     Problem: Psychosocial and Spiritual Needs  Goal: Demonstrates ability to cope with hospitalization/illness  Outcome: Adequate for Discharge     Problem: Vaginal/Cesarean Delivery  Goal: Evidence of Fetal Well Being  Outcome: Adequate for Discharge

## 2020-10-28 NOTE — Discharge Summary (Signed)
Post-Partum Day 2    S:   Catherine Spence is a 30 y.o. [id number] is doing well.  Tolerating PO.  Ambulating without difficulty.  Pain controlled with  meds    O:     VS: Patient Vitals for the past 24 hrs:   BP Temp Temp src Pulse Resp   10/28/20 1102 120/84 98.1 F (36.7 C) Oral 96 17   10/28/20 0509 125/83 98.4 F (36.9 C) Oral 76 19   10/27/20 2251 103/68 98.1 F (36.7 C) Oral 78 18   10/27/20 1728 129/87 98.2 F (36.8 C) Oral 92 16      General: no acute distress   CVS: RRR   Pulm: Clear, bilaterally   Abd: soft, non-tender, fundus firm below the umbilicus      Vag: Lochia decreasing.       Perineum: intact    Ext:  edema, cords or tenderness     Results       Procedure Component Value Units Date/Time    HIV Ag/Ab 4th generation [[id number]] Resulted: 04/22/20     Updated: 10/28/20 1152     HIV Ag/Ab, 4th Generation nonreactive    CBC without differential [[id number]]  (Abnormal) Collected: 10/27/20 0648     Updated: 10/27/20 0734     WBC 13.64 x10 3/uL      Hgb 9.4 g/dL      Hematocrit 13.0 %      Platelets 241 x10 3/uL      RBC 3.74 x10 6/uL      MCV 78.9 fL      MCH 25.1 pg      MCHC 31.9 g/dL      RDW 15 %      MPV 10.0 fL      Nucleated RBC 0.0 /100 WBC      Absolute NRBC 0.00 x10 3/uL     Type and Screen [[id number]] Collected: 10/25/20 2142    Specimen: Blood Updated: 10/25/20 2225     ABO Rh O POS     AB Screen Gel NEG    CBC and differential [[id number]]  (Abnormal) Collected: 10/25/20 2142    Specimen: Blood Updated: 10/25/20 2151     WBC 13.38 x10 3/uL      Hgb 10.1 g/dL      Hematocrit 44.0 %      Platelets 265 x10 3/uL      RBC 4.04 x10 6/uL      MCV 80.0 fL      MCH 25.0 pg      MCHC 31.3 g/dL      RDW 15 %      MPV 9.6 fL      Neutrophils 65.5 %      Lymphocytes Automated 25.2 %      Monocytes 7.8 %      Eosinophils Automated 0.2 %      Basophils Automated 0.2 %      Immature Granulocytes 1.1 %      Nucleated RBC 0.0 /100 WBC      Neutrophils Absolute 8.76 x10 3/uL      Lymphocytes Absolute  Automated 3.37 x10 3/uL      Monocytes Absolute Automated 1.04 x10 3/uL      Eosinophils Absolute Automated 0.03 x10 3/uL      Basophils Absolute Automated 0.03 x10 3/uL      Immature Granulocytes Absolute 0.15 x10 3/uL      Absolute NRBC 0.00 x10 3/uL  GBS Transcribed [[id number]] Resulted: 07/01/20     Updated: 10/25/20 2110     GBS Transcribed Positive-Urine    Hepatitis B (HBV) Surface Antigen w/ Reflex to Confirmation [[id number]] Resulted: 04/22/20    Specimen: Blood Updated: 10/25/20 2110     Hepatitis B Surface Antigen Negative    RPR [[id number]] Resulted: 04/22/20    Specimen: Blood Updated: 10/25/20 2110     RPR Nonreactive    Rubella antibody, IgG [[id number]] Resulted: 04/22/20    Specimen: Blood Updated: 10/25/20 2110     Rubella AB, IgG Non-Immune    Gonococcus culture [[id number]] Resulted: 09/29/20    Specimen: Culturette Updated: 10/25/20 2110     Chlamydia trachomatis Culture Negative     Culture Gonorrhoeae Negative    Antibody Screen [[id number]] Resulted: 04/22/20    Specimen: Blood Updated: 10/25/20 2110     AB Screen Gel negative            A viable female  infant with APGARs of 9  and 9  at 1 and 5 minutes respectively.  Weight 5 lb 14.9 oz (2690 g) .    A/P:    Postpartum  S/P SVD.    Rh    RUBELLA NON IMMUNE ADVISED TO GET THE  mmr  PRIOR TO DISCHARGE  RN TO REVIEW WITH PATIENT TDAP    -Routine post-partum care  -discharge TODAY       Orson Aloe, MD

## 2020-11-09 ENCOUNTER — Emergency Department: Payer: Medicaid Other

## 2020-11-09 ENCOUNTER — Emergency Department
Admission: EM | Admit: 2020-11-09 | Discharge: 2020-11-09 | Disposition: A | Discharge status: Home / Self Care | Payer: Medicaid Other | Attending: Emergency Medicine | Admitting: Emergency Medicine

## 2020-11-09 DIAGNOSIS — F419 Anxiety disorder, unspecified: Secondary | ICD-10-CM | POA: Insufficient documentation

## 2020-11-09 DIAGNOSIS — R079 Chest pain, unspecified: Secondary | ICD-10-CM | POA: Insufficient documentation

## 2020-11-09 DIAGNOSIS — Z87891 Personal history of nicotine dependence: Secondary | ICD-10-CM | POA: Insufficient documentation

## 2020-11-09 DIAGNOSIS — F53 Postpartum depression: Secondary | ICD-10-CM | POA: Insufficient documentation

## 2020-11-09 DIAGNOSIS — Z20822 Contact with and (suspected) exposure to covid-19: Secondary | ICD-10-CM | POA: Insufficient documentation

## 2020-11-09 DIAGNOSIS — J0111 Acute recurrent frontal sinusitis: Secondary | ICD-10-CM | POA: Insufficient documentation

## 2020-11-09 DIAGNOSIS — O99345 Other mental disorders complicating the puerperium: Secondary | ICD-10-CM | POA: Insufficient documentation

## 2020-11-09 LAB — COMPREHENSIVE METABOLIC PANEL
ALT: 26 U/L (ref 0–55)
AST (SGOT): 17 U/L (ref 5–34)
Albumin/Globulin Ratio: 1 (ref 0.9–2.2)
Albumin: 3.1 g/dL — ABNORMAL LOW (ref 3.5–5.0)
Alkaline Phosphatase: 73 U/L (ref 37–117)
Anion Gap: 10 (ref 5.0–15.0)
BUN: 15 mg/dL (ref 7.0–19.0)
Bilirubin, Total: 0.4 mg/dL (ref 0.2–1.2)
CO2: 23 mEq/L (ref 22–29)
Calcium: 8.9 mg/dL (ref 8.5–10.5)
Chloride: 108 mEq/L (ref 100–111)
Creatinine: 0.9 mg/dL (ref 0.6–1.0)
Globulin: 3 g/dL (ref 2.0–3.6)
Glucose: 100 mg/dL (ref 70–100)
Potassium: 3.9 mEq/L (ref 3.5–5.1)
Protein, Total: 6.1 g/dL (ref 6.0–8.3)
Sodium: 141 mEq/L (ref 136–145)

## 2020-11-09 LAB — CBC AND DIFFERENTIAL
Absolute NRBC: 0 10*3/uL (ref 0.00–0.00)
Basophils Absolute Automated: 0.03 10*3/uL (ref 0.00–0.08)
Basophils Automated: 0.4 %
Eosinophils Absolute Automated: 0.14 10*3/uL (ref 0.00–0.44)
Eosinophils Automated: 1.7 %
Hematocrit: 35.3 % (ref 34.7–43.7)
Hgb: 11.1 g/dL — ABNORMAL LOW (ref 11.4–14.8)
Immature Granulocytes Absolute: 0.02 10*3/uL (ref 0.00–0.07)
Immature Granulocytes: 0.2 %
Lymphocytes Absolute Automated: 2.7 10*3/uL (ref 0.42–3.22)
Lymphocytes Automated: 33.5 %
MCH: 24.7 pg — ABNORMAL LOW (ref 25.1–33.5)
MCHC: 31.4 g/dL — ABNORMAL LOW (ref 31.5–35.8)
MCV: 78.6 fL (ref 78.0–96.0)
MPV: 9.9 fL (ref 8.9–12.5)
Monocytes Absolute Automated: 0.41 10*3/uL (ref 0.21–0.85)
Monocytes: 5.1 %
Neutrophils Absolute: 4.76 10*3/uL (ref 1.10–6.33)
Neutrophils: 59.1 %
Nucleated RBC: 0 /100 WBC (ref 0.0–0.0)
Platelets: 258 10*3/uL (ref 142–346)
RBC: 4.49 10*6/uL (ref 3.90–5.10)
RDW: 15 % (ref 11–15)
WBC: 8.06 10*3/uL (ref 3.10–9.50)

## 2020-11-09 LAB — COVID-19 (SARS-COV-2): SARS CoV-2: NEGATIVE

## 2020-11-09 LAB — TROPONIN I: Troponin I: 0.01 ng/mL (ref 0.00–0.05)

## 2020-11-09 LAB — HCG, SERUM, QUALITATIVE: Hcg Qualitative: NEGATIVE

## 2020-11-09 LAB — GROUP A STREP, RAPID ANTIGEN: Group A Strep, Rapid Antigen: NEGATIVE

## 2020-11-09 LAB — GFR: EGFR: >60

## 2020-11-09 LAB — IHS D-DIMER: D-Dimer: 1.16 ug/mL FEU — ABNORMAL HIGH (ref 0.00–0.60)

## 2020-11-09 MED ORDER — DOXYCYCLINE HYCLATE 100 MG PO TABS
100.0000 mg | ORAL_TABLET | Freq: Two times a day (BID) | ORAL | 0 refills | Status: AC
Start: 2020-11-09 — End: 2020-11-19

## 2020-11-09 MED ORDER — IOHEXOL 350 MG/ML IV SOLN
100.0000 mL | Freq: Once | INTRAVENOUS | Status: AC | PRN
Start: 2020-11-09 — End: 2020-11-09
  Administered 2020-11-09: 16:00:00 100 mL via INTRAVENOUS

## 2020-11-09 MED ORDER — EPINEPHRINE 0.3 MG/0.3ML IJ SOAJ
0.3000 mg | INTRAMUSCULAR | 0 refills | Status: DC | PRN
Start: 2020-11-09 — End: 2023-01-04

## 2020-11-09 MED ORDER — TRIAMCINOLONE ACETONIDE 55 MCG/ACT NA AERO
2.0000 | INHALATION_SPRAY | Freq: Every day | NASAL | 0 refills | Status: DC
Start: 2020-11-09 — End: 2021-12-31
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
## 2020-11-10 LAB — ECG 12-LEAD
Atrial Rate: 68 {beats}/min
IHS MUSE NARRATIVE AND IMPRESSION: NORMAL
P Axis: 39 degrees
P-R Interval: 166 ms
Q-T Interval: 412 ms
QRS Duration: 88 ms
QTC Calculation (Bezet): 438 ms
R Axis: 1 degrees
T Axis: 34 degrees
Ventricular Rate: 68 {beats}/min

## 2021-01-05 ENCOUNTER — Emergency Department: Payer: Medicaid Other

## 2021-01-05 ENCOUNTER — Emergency Department
Admission: EM | Admit: 2021-01-05 | Discharge: 2021-01-05 | Disposition: A | Discharge status: Home / Self Care | Payer: Medicaid Other | Attending: Emergency Medicine | Admitting: Emergency Medicine

## 2021-01-05 DIAGNOSIS — F17210 Nicotine dependence, cigarettes, uncomplicated: Secondary | ICD-10-CM | POA: Insufficient documentation

## 2021-01-05 DIAGNOSIS — R051 Acute cough: Secondary | ICD-10-CM

## 2021-01-05 DIAGNOSIS — U071 COVID-19: Secondary | ICD-10-CM | POA: Insufficient documentation

## 2021-01-05 LAB — COVID-19 (SARS-COV-2): SARS CoV-2: POSITIVE — AB

## 2021-01-05 MED ORDER — DEXAMETHASONE SOD PHOSPHATE PF 10 MG/ML IJ SOLN
10.0000 mg | Freq: Once | INTRAMUSCULAR | Status: DC
Start: 2021-01-05 — End: 2021-01-05

## 2021-01-05 MED ORDER — KETOROLAC TROMETHAMINE 30 MG/ML IJ SOLN
30.0000 mg | Freq: Once | INTRAMUSCULAR | Status: AC
Start: 2021-01-05 — End: 2021-01-05
  Administered 2021-01-05: 20:00:00 30 mg via INTRAMUSCULAR
  Filled 2021-01-05: qty 1

## 2021-01-05 MED ORDER — NIRMATRELVIR&RITONAVIR 300/100 20 X 150 MG & 10 X 100MG PO TBPK
3.0000 | ORAL_TABLET | Freq: Two times a day (BID) | ORAL | 0 refills | Status: DC
Start: 2021-01-05 — End: 2021-01-05

## 2021-01-05 NOTE — Discharge Instructions (Signed)
You will be contacted by the infusion clinic for your monoclonal antibodies as you cannot take paxlovid due to drug interactions.
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
## 2021-01-09 ENCOUNTER — Ambulatory Visit: Payer: Medicaid Other | Attending: Hospitalist

## 2021-01-09 DIAGNOSIS — U071 COVID-19: Secondary | ICD-10-CM | POA: Insufficient documentation

## 2021-01-09 DIAGNOSIS — Z23 Encounter for immunization: Secondary | ICD-10-CM | POA: Insufficient documentation

## 2021-01-09 MED ORDER — ACETAMINOPHEN 325 MG PO TABS
650.0000 mg | ORAL_TABLET | Freq: Once | ORAL | Status: AC | PRN
Start: 2021-01-09 — End: 2021-01-10

## 2021-01-09 MED ORDER — SODIUM CHLORIDE 0.9 % IV BOLUS
500.0000 mL | Freq: Once | INTRAVENOUS | Status: AC | PRN
Start: 2021-01-09 — End: 2021-01-10

## 2021-01-09 MED ORDER — FAMOTIDINE 10 MG/ML IV SOLN (WRAP)
20.0000 mg | Freq: Once | INTRAVENOUS | Status: AC | PRN
Start: 2021-01-09 — End: 2021-01-10

## 2021-01-09 MED ORDER — SODIUM CHLORIDE 0.9 % IV SOLN
25.0000 mL/h | INTRAVENOUS | Status: AC | PRN
Start: 2021-01-09 — End: 2021-01-10

## 2021-01-09 MED ORDER — DIPHENHYDRAMINE HCL 50 MG/ML IJ SOLN
25.0000 mg | Freq: Once | INTRAMUSCULAR | Status: AC | PRN
Start: 2021-01-09 — End: 2021-01-10

## 2021-01-09 MED ORDER — ONDANSETRON HCL 4 MG/2ML IJ SOLN
4.0000 mg | Freq: Once | INTRAMUSCULAR | Status: AC | PRN
Start: 2021-01-09 — End: 2021-01-10

## 2021-01-09 MED ORDER — BEBTELOVIMAB 175 MG/2ML IV SOLN (EUA)
175.0000 mg | Freq: Once | INTRAVENOUS | Status: AC
Start: 2021-01-09 — End: 2021-01-09
  Administered 2021-01-09: 12:00:00 175 mg via INTRAVENOUS
  Filled 2021-01-09: qty 2

## 2021-01-09 MED ORDER — DIPHENHYDRAMINE HCL 50 MG/ML IJ SOLN
50.0000 mg | Freq: Once | INTRAMUSCULAR | Status: AC | PRN
Start: 2021-01-09 — End: 2021-01-10

## 2021-01-09 MED ORDER — HYDROCORTISONE SOD SUC (PF) 100 MG IJ SOLR (WRAP)
100.0000 mg | Freq: Once | INTRAMUSCULAR | Status: AC | PRN
Start: 2021-01-09 — End: 2021-01-10

## 2021-01-09 MED ORDER — EPINEPHRINE HCL 1 MG/ML ADULT ANAPHYLAXIS KIT
0.3000 mg | Freq: Once | INTRAMUSCULAR | Status: AC | PRN
Start: 2021-01-09 — End: 2021-01-10

## 2021-01-09 NOTE — Progress Notes (Signed)
Catherine Spence is a 30 y.o. here for intravenous injection of Bebtelovimab, see referral in Epic. Patient is ambulatory to infusion clinic, denies any acute SOB or chest pain.   Patient reports they have tested positive for COVID-19 in the last 7 days and is at high risk of progressing to severe disease.  If applicable, patient has had symptoms of COVID-19 for 7 days or less.    The patient was provided with the EUA fact sheet for monoclonal antibody and has no additional questions.   After review of the EUA fact sheet, the patient agrees to and requests infusion of the monoclonal antibody for COVID-19  The patient understands this visit is for infusion of the monoclonal antibody only and should follow up with a healthcare provider for close monitoring. The patients also understands they should also go to the emergency department for any new or worsening symptoms.    Has the patient been prescribed or currently taking PAXLOVID?  no  Symptoms include: mucous, cough,tired, dizzy  Onset of symptoms: 10/2  COVID positive test date: 10/4  Vaccination date and manufacturer: n/a    Bebtelovimab administered as ordered and given via IV push for 30 seconds.  Monitored for one hour post intravenous injection.  Patient tolerated the medication without adverse SE and was given Fact Sheet for Patients, Parents, and Caregivers receiving Bebtelovimab.      Patient was discharged 1250

## 2021-01-12 ENCOUNTER — Emergency Department: Payer: Medicaid Other

## 2021-01-12 ENCOUNTER — Emergency Department
Admission: EM | Admit: 2021-01-12 | Discharge: 2021-01-13 | Disposition: A | Discharge status: Home / Self Care | Payer: Medicaid Other | Attending: Emergency Medicine | Admitting: Emergency Medicine

## 2021-01-12 DIAGNOSIS — X58XXXA Exposure to other specified factors, initial encounter: Secondary | ICD-10-CM | POA: Insufficient documentation

## 2021-01-12 DIAGNOSIS — F17210 Nicotine dependence, cigarettes, uncomplicated: Secondary | ICD-10-CM | POA: Insufficient documentation

## 2021-01-12 DIAGNOSIS — T180XXA Foreign body in mouth, initial encounter: Secondary | ICD-10-CM | POA: Insufficient documentation

## 2021-01-12 DIAGNOSIS — R052 Subacute cough: Secondary | ICD-10-CM | POA: Insufficient documentation

## 2021-01-12 DIAGNOSIS — T17908A Unspecified foreign body in respiratory tract, part unspecified causing other injury, initial encounter: Secondary | ICD-10-CM

## 2021-01-12 LAB — URINE HCG QUALITATIVE: Urine HCG Qualitative: NEGATIVE
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
## 2021-01-12 NOTE — ED Triage Notes (Signed)
Patient tested positive for covid a week ago, two days ago she tested negative. Today she complains of inhaling her nose ring on accident. She complains that she can feel where it scraped the inside of her nose, and she can taste it when she breathes. No difficulty breathing noted.

## 2021-01-13 NOTE — Discharge Instructions (Signed)
Recommend you follow-up with Dr. Candelaria Stagers your pulmonologist tomorrow.  You are now refusing to get a CAT scan at this time recommend you return to the emergency department any worsening symptoms possibly need a CAT scan of your neck or lungs.  If any shortness of breath chest pain fevers chills you need to immediately return to the emergency

## 2021-04-04 NOTE — L&D Delivery Note (Signed)
DELIVERY NOTE    Patient Name: Catherine Spence, Catherine Spence  [id number]  Gestational age: [redacted]w[redacted]d    Procedure:   Vaginal Delivery  Patient presented to labor and delivery for IOL for Fetal Demise     Anesthesia:   Epidural    Brief Notes:   Rupture:      Fluid color:       Birth Date: 12/30/2021   Delivery Time: 9:14 PM      Normal spontaneous vaginal birth of nonviable female child. Head delivered spontaneously. Nuchal cord x 1, Tight knot around the neck delivered with.  Shoulders and body birthed without difficulty. Baby to maternal abdomen. Cord clamped x2 and cut immediately by Dr Linna Caprice .  Placenta delivered spontaneously. Inspected, complete and intact with cord with normal cord insertion. FFU/U with massage.  Pitocin 30 units in 500 cc infusing for good uterine response.  Perineum inspected and perineum intact.   Count complete and correct.    APGAR:   1 Minute Apgar:     0   5 Minute Apgar:     0       Birth Weight:          Placenta Delivery Date:        Placenta Delivery Time:           Estimated Blood Loss:        Additional Notes:   Patient was GBS unknwown    Available physician: Dr Linna Caprice    Providers Performing:   Darrow Bussing, MD, CNM    Signed by: Darrow Bussing, MD, CNMDelivery Information for [name]    Labor Details:    Preterm labor:     Cervical ripening:            Induction method:     Augmentation method:     Rupture date:     Rupture time:     Rupture type:     Fluid Color:     Fluid Odor         Risk Factors:                                                Delivery Complications:                                                 Vaginal Delivery Counts    Initial Count Personnel:      Initial Count verified by:      Final Count Personnel:      Final Count verified by:     Final Count Accurate?:          Presentation/Position                            Delivery (Newborn)    FHR in O.R.:     Delivery Rm Temp:     Date of birth: 20-Feb-1991   Time of birth:    Sex: female   GA: Gestational Age:  <None>   Delivery Type: Therapeutic Abortion     Vaginal Spontaneous    Trial of Labor:  Forceps attempted:     Vacuum attempted:     Vacuum Indications:     Vacuum Location:     Vacuum Type:     Number of Pop offs:     Total Time Vac Applied:     Forceps Type:     Forceps Location:         Delivery Maneuvers    Non-Dystocia Maneuver:                          :                                 Delivery (Maternal)    Episiotomy:          Laceration Type:           Other Lacerations:                    Blood Loss (mL):     Uterus Explored:     Anesthesia Method:              Newborn Assessment    Living at birth: Fetal Demise           APGARS  One minute Five minutes Ten minutes   Skin color: 0   0   0     Heart rate: 0   0   0     Grimace: 0   0   0     Muscle tone: 0   0   0     Breathing: 0   0   0     Totals: 0   0   0             Resuscitation    Resuscitation:          Suction:     Prior to DEL of chest:         Newborn Measurements    Weight:     Length:     Birth Comments:         Cord Information    Vessels: 3 vessels    Complications: Nuchal    Nuchal Cord:        Cut before DEL of shoulder:     Blood gases sent?     Cord Tissue Sample:         Placenta Information    Placenta Removal:       Placenta Appearance:         Culture/Pathology    Cultures obtained:          Pathology Specimens:

## 2021-06-09 ENCOUNTER — Other Ambulatory Visit (INDEPENDENT_AMBULATORY_CARE_PROVIDER_SITE_OTHER): Payer: Self-pay | Admitting: Registered Nurse

## 2021-12-30 ENCOUNTER — Observation Stay: Payer: Medicaid Other | Admitting: Certified Registered"

## 2021-12-30 ENCOUNTER — Inpatient Hospital Stay
Admission: RE | Admit: 2021-12-30 | Discharge: 2021-12-31 | DRG: 560 | Disposition: A | Discharge status: Home / Self Care | Payer: Medicaid Other | Attending: Obstetrics & Gynecology | Admitting: Obstetrics & Gynecology

## 2021-12-30 ENCOUNTER — Ambulatory Visit: Payer: Self-pay

## 2021-12-30 ENCOUNTER — Ambulatory Visit: Payer: Medicaid Other

## 2021-12-30 DIAGNOSIS — G43909 Migraine, unspecified, not intractable, without status migrainosus: Secondary | ICD-10-CM | POA: Diagnosis present

## 2021-12-30 DIAGNOSIS — O99214 Obesity complicating childbirth: Secondary | ICD-10-CM | POA: Diagnosis present

## 2021-12-30 DIAGNOSIS — O9952 Diseases of the respiratory system complicating childbirth: Secondary | ICD-10-CM | POA: Diagnosis present

## 2021-12-30 DIAGNOSIS — O364XX0 Maternal care for intrauterine death, not applicable or unspecified: Principal | ICD-10-CM | POA: Diagnosis present

## 2021-12-30 DIAGNOSIS — O99344 Other mental disorders complicating childbirth: Secondary | ICD-10-CM | POA: Diagnosis present

## 2021-12-30 DIAGNOSIS — O99512 Diseases of the respiratory system complicating pregnancy, second trimester: Secondary | ICD-10-CM | POA: Insufficient documentation

## 2021-12-30 DIAGNOSIS — F418 Other specified anxiety disorders: Secondary | ICD-10-CM | POA: Diagnosis present

## 2021-12-30 DIAGNOSIS — O99352 Diseases of the nervous system complicating pregnancy, second trimester: Secondary | ICD-10-CM | POA: Insufficient documentation

## 2021-12-30 DIAGNOSIS — O99334 Smoking (tobacco) complicating childbirth: Secondary | ICD-10-CM | POA: Diagnosis present

## 2021-12-30 DIAGNOSIS — F32A Depression, unspecified: Secondary | ICD-10-CM | POA: Diagnosis present

## 2021-12-30 DIAGNOSIS — O99354 Diseases of the nervous system complicating childbirth: Secondary | ICD-10-CM | POA: Diagnosis present

## 2021-12-30 DIAGNOSIS — J45909 Unspecified asthma, uncomplicated: Secondary | ICD-10-CM | POA: Diagnosis present

## 2021-12-30 DIAGNOSIS — O99342 Other mental disorders complicating pregnancy, second trimester: Secondary | ICD-10-CM | POA: Insufficient documentation

## 2021-12-30 DIAGNOSIS — O0942 Supervision of pregnancy with grand multiparity, second trimester: Secondary | ICD-10-CM | POA: Insufficient documentation

## 2021-12-30 DIAGNOSIS — Z3A20 20 weeks gestation of pregnancy: Secondary | ICD-10-CM

## 2021-12-30 DIAGNOSIS — Z880 Allergy status to penicillin: Secondary | ICD-10-CM

## 2021-12-30 DIAGNOSIS — O98512 Other viral diseases complicating pregnancy, second trimester: Secondary | ICD-10-CM | POA: Insufficient documentation

## 2021-12-30 LAB — TYPE AND SCREEN
AB Screen Gel: NEGATIVE
ABO Rh: O POS

## 2021-12-30 LAB — CBC AND DIFFERENTIAL
Absolute NRBC: 0 10*3/uL (ref 0.00–0.00)
Absolute NRBC: 0 10*3/uL (ref 0.00–0.00)
Basophils Absolute Automated: 0.02 10*3/uL (ref 0.00–0.08)
Basophils Absolute Automated: 0.02 10*3/uL (ref 0.00–0.08)
Basophils Automated: 0.2 %
Basophils Automated: 0.2 %
Eosinophils Absolute Automated: 0.11 10*3/uL (ref 0.00–0.44)
Eosinophils Absolute Automated: 0.06 10*3/uL (ref 0.00–0.44)
Eosinophils Automated: 1.3 %
Eosinophils Automated: 0.6 %
Hematocrit: 33.7 % — ABNORMAL LOW (ref 34.7–43.7)
Hematocrit: 32.6 % — ABNORMAL LOW (ref 34.7–43.7)
Hgb: 10.9 g/dL — ABNORMAL LOW (ref 11.4–14.8)
Hgb: 10.6 g/dL — ABNORMAL LOW (ref 11.4–14.8)
Immature Granulocytes Absolute: 0.04 10*3/uL (ref 0.00–0.07)
Immature Granulocytes Absolute: 0.05 10*3/uL (ref 0.00–0.07)
Immature Granulocytes: 0.5 %
Immature Granulocytes: 0.5 %
Instrument Absolute Neutrophil Count: 5.87 10*3/uL (ref 1.10–6.33)
Instrument Absolute Neutrophil Count: 6.97 10*3/uL — ABNORMAL HIGH (ref 1.10–6.33)
Lymphocytes Absolute Automated: 2.12 10*3/uL (ref 0.42–3.22)
Lymphocytes Absolute Automated: 2.79 10*3/uL (ref 0.42–3.22)
Lymphocytes Automated: 24.5 %
Lymphocytes Automated: 26.7 %
MCH: 25.4 pg (ref 25.1–33.5)
MCH: 25.6 pg (ref 25.1–33.5)
MCHC: 32.3 g/dL (ref 31.5–35.8)
MCHC: 32.5 g/dL (ref 31.5–35.8)
MCV: 78.6 fL (ref 78.0–96.0)
MCV: 78.7 fL (ref 78.0–96.0)
MPV: 9.8 fL (ref 8.9–12.5)
MPV: 9.5 fL (ref 8.9–12.5)
Monocytes Absolute Automated: 0.51 10*3/uL (ref 0.21–0.85)
Monocytes Absolute Automated: 0.55 10*3/uL (ref 0.21–0.85)
Monocytes: 5.9 %
Monocytes: 5.3 %
Neutrophils Absolute: 5.87 10*3/uL (ref 1.10–6.33)
Neutrophils Absolute: 6.97 10*3/uL — ABNORMAL HIGH (ref 1.10–6.33)
Neutrophils: 67.6 %
Neutrophils: 66.7 %
Nucleated RBC: 0 /100 WBC (ref 0.0–0.0)
Nucleated RBC: 0 /100 WBC (ref 0.0–0.0)
Platelets: 255 10*3/uL (ref 142–346)
Platelets: 250 10*3/uL (ref 142–346)
RBC: 4.29 10*6/uL (ref 3.90–5.10)
RBC: 4.14 10*6/uL (ref 3.90–5.10)
RDW: 16 % — ABNORMAL HIGH (ref 11–15)
RDW: 16 % — ABNORMAL HIGH (ref 11–15)
WBC: 8.67 10*3/uL (ref 3.10–9.50)
WBC: 10.44 10*3/uL — ABNORMAL HIGH (ref 3.10–9.50)

## 2021-12-30 LAB — COMPREHENSIVE METABOLIC PANEL
ALT: 5 U/L (ref 0–55)
AST (SGOT): 7 U/L (ref 5–41)
Albumin/Globulin Ratio: 1 (ref 0.9–2.2)
Albumin: 2.9 g/dL — ABNORMAL LOW (ref 3.5–5.0)
Alkaline Phosphatase: 46 U/L (ref 37–117)
Anion Gap: 10 (ref 5.0–15.0)
BUN: 5 mg/dL — ABNORMAL LOW (ref 7.0–21.0)
Bilirubin, Total: 0.3 mg/dL (ref 0.2–1.2)
CO2: 21 mEq/L (ref 17–29)
Calcium: 8.4 mg/dL — ABNORMAL LOW (ref 8.5–10.5)
Chloride: 109 mEq/L (ref 99–111)
Creatinine: 0.7 mg/dL (ref 0.4–1.0)
Globulin: 2.8 g/dL (ref 2.0–3.6)
Glucose: 86 mg/dL (ref 70–100)
Potassium: 4 mEq/L (ref 3.5–5.3)
Protein, Total: 5.7 g/dL — ABNORMAL LOW (ref 6.0–8.3)
Sodium: 140 mEq/L (ref 135–145)
eGFR: >60 mL/min/{1.73_m2} (ref >=60)

## 2021-12-30 LAB — RAPID DRUG SCREEN, URINE
Barbiturate Screen, UR: NEGATIVE
Benzodiazepine Screen, UR: NEGATIVE
Cannabinoid Screen, UR: NEGATIVE
Cocaine, UR: NEGATIVE
Opiate Screen, UR: NEGATIVE
PCP Screen, UR: NEGATIVE
Urine Amphetamine Screen: NEGATIVE
Urine Fentanyl: NEGATIVE

## 2021-12-30 LAB — URINALYSIS, REFLEX TO MICROSCOPIC EXAM IF INDICATED
Bilirubin, UA: NEGATIVE
Blood, UA: NEGATIVE
Glucose, UA: NEGATIVE
Leukocyte Esterase, UA: NEGATIVE
Nitrite, UA: NEGATIVE
Protein, UR: NEGATIVE
Specific Gravity UA: 1.009 (ref 1.001–1.035)
Urine pH: 6 (ref 5.0–8.0)
Urobilinogen, UA: NORMAL mg/dL

## 2021-12-30 LAB — SYPHILIS SCREEN IGG AND IGM: Syphilis Screen IgG and IgM: NONREACTIVE

## 2021-12-30 LAB — HEMOGLOBIN A1C
Average Estimated Glucose: 99.7 mg/dL
Hemoglobin A1C: 5.1 % (ref 4.6–5.6)

## 2021-12-30 LAB — LACTIC ACID, PLASMA: Lactic Acid: 0.9 mmol/L (ref 0.2–2.0)

## 2021-12-30 MED ORDER — NALOXONE HCL 0.4 MG/ML IJ SOLN (WRAP)
0.2000 mg | INTRAMUSCULAR | Status: DC | PRN
Start: 2021-12-30 — End: 2021-12-31

## 2021-12-30 MED ORDER — MISOPROSTOL 200 MCG PO TABS
800.0000 ug | ORAL_TABLET | Freq: Once | ORAL | Status: AC
Start: 2021-12-30 — End: 2021-12-30
  Administered 2021-12-30: 800 ug via VAGINAL
  Filled 2021-12-30: qty 4

## 2021-12-30 MED ORDER — NIFEDIPINE 10 MG PO CAPS
20.0000 mg | ORAL_CAPSULE | Freq: Once | ORAL | Status: DC | PRN
Start: 2021-12-30 — End: 2021-12-31

## 2021-12-30 MED ORDER — FAMOTIDINE 10 MG/ML IV SOLN (WRAP)
20.0000 mg | Freq: Two times a day (BID) | INTRAVENOUS | Status: DC
Start: 2021-12-30 — End: 2021-12-31

## 2021-12-30 MED ORDER — SODIUM CHLORIDE 0.9 % IV SOLN
6.2500 mg | Freq: Four times a day (QID) | INTRAVENOUS | Status: DC | PRN
Start: 2021-12-30 — End: 2021-12-31

## 2021-12-30 MED ORDER — NIFEDIPINE 10 MG PO CAPS
10.0000 mg | ORAL_CAPSULE | Freq: Once | ORAL | Status: DC | PRN
Start: 2021-12-30 — End: 2021-12-31

## 2021-12-30 MED ORDER — LACTATED RINGERS IV BOLUS
1000.0000 mL | Freq: Once | INTRAVENOUS | Status: DC
Start: 2021-12-30 — End: 2021-12-31

## 2021-12-30 MED ORDER — OXYTOCIN-SODIUM CHLORIDE 30-0.9 UT/500ML-% IV SOLN
7.5000 [IU]/h | INTRAVENOUS | Status: DC | PRN
Start: 2021-12-30 — End: 2021-12-31
  Administered 2021-12-30: 59.94 [IU]/h via INTRAVENOUS
  Filled 2021-12-30: qty 500

## 2021-12-30 MED ORDER — MISOPROSTOL 200 MCG PO TABS
800.0000 ug | ORAL_TABLET | Freq: Once | ORAL | Status: DC | PRN
Start: 2021-12-30 — End: 2021-12-31

## 2021-12-30 MED ORDER — MISOPROSTOL 200 MCG PO TABS
400.0000 ug | ORAL_TABLET | ORAL | Status: AC
Start: 2021-12-30 — End: 2021-12-30
  Administered 2021-12-30 (×2): 400 ug via VAGINAL

## 2021-12-30 MED ORDER — TETANUS-DIPHTH-ACELL PERTUSSIS 5-2.5-18.5 LF-MCG/0.5 IM SUSP/SUSY (WR)
0.5000 mL | INTRAMUSCULAR | Status: DC | PRN
Start: 2021-12-30 — End: 2021-12-31

## 2021-12-30 MED ORDER — FAMOTIDINE 10 MG/ML IV SOLN (WRAP)
20.0000 mg | Freq: Once | INTRAVENOUS | Status: DC | PRN
Start: 2021-12-30 — End: 2021-12-31

## 2021-12-30 MED ORDER — TRANEXAMIC ACID-NACL 1000-0.7 MG/100ML-% IV SOLN
1000.0000 mg | Freq: Once | INTRAVENOUS | Status: DC | PRN
Start: 2021-12-30 — End: 2021-12-31

## 2021-12-30 MED ORDER — FAMOTIDINE 20 MG PO TABS
20.0000 mg | ORAL_TABLET | Freq: Two times a day (BID) | ORAL | Status: DC
Start: 2021-12-30 — End: 2021-12-31

## 2021-12-30 MED ORDER — HYDROCORTISONE 1 % EX OINT
TOPICAL_OINTMENT | Freq: Three times a day (TID) | CUTANEOUS | Status: DC | PRN
Start: 2021-12-30 — End: 2021-12-31

## 2021-12-30 MED ORDER — SERTRALINE HCL 50 MG PO TABS
50.0000 mg | ORAL_TABLET | Freq: Every day | ORAL | Status: DC
Start: 2021-12-30 — End: 2021-12-30

## 2021-12-30 MED ORDER — ALUM & MAG HYDROXIDE-SIMETH 200-200-20 MG/5ML PO SUSP
30.0000 mL | Freq: Four times a day (QID) | ORAL | Status: DC | PRN
Start: 2021-12-30 — End: 2021-12-31

## 2021-12-30 MED ORDER — PROMETHAZINE HCL 12.5 MG PO TABS
12.5000 mg | ORAL_TABLET | Freq: Four times a day (QID) | ORAL | Status: DC | PRN
Start: 2021-12-30 — End: 2021-12-31

## 2021-12-30 MED ORDER — EPHEDRINE SULFATE 50 MG/ML IJ/IV SOLN (WRAP)
10.0000 mg | Freq: Once | Status: AC | PRN
Start: 2021-12-30 — End: 2021-12-30
  Administered 2021-12-30: 10 mg via INTRAVENOUS
  Filled 2021-12-30: qty 1

## 2021-12-30 MED ORDER — ONDANSETRON 4 MG PO TBDP
8.0000 mg | ORAL_TABLET | Freq: Three times a day (TID) | ORAL | Status: DC | PRN
Start: 2021-12-30 — End: 2021-12-31

## 2021-12-30 MED ORDER — METOCLOPRAMIDE HCL 5 MG/ML IJ SOLN
10.0000 mg | Freq: Once | INTRAMUSCULAR | Status: DC | PRN
Start: 2021-12-30 — End: 2021-12-31

## 2021-12-30 MED ORDER — MISOPROSTOL 200 MCG PO TABS
400.0000 ug | ORAL_TABLET | ORAL | Status: DC
Start: 2021-12-30 — End: 2021-12-30
  Administered 2021-12-30: 400 ug via VAGINAL
  Filled 2021-12-30: qty 2

## 2021-12-30 MED ORDER — SOD CITRATE-CITRIC ACID 500-334 MG/5ML PO SOLN
30.0000 mL | Freq: Once | ORAL | Status: DC | PRN
Start: 2021-12-30 — End: 2021-12-31

## 2021-12-30 MED ORDER — PROMETHAZINE HCL 25 MG RE SUPP
12.5000 mg | Freq: Four times a day (QID) | RECTAL | Status: DC | PRN
Start: 2021-12-30 — End: 2021-12-31

## 2021-12-30 MED ORDER — MAGNESIUM HYDROXIDE 400 MG/5ML PO SUSP
30.0000 mL | Freq: Four times a day (QID) | ORAL | Status: DC | PRN
Start: 2021-12-30 — End: 2021-12-31

## 2021-12-30 MED ORDER — BUPIVACAINE HCL (PF) 0.5 % IJ SOLN
30.0000 mL | Freq: Once | INTRAMUSCULAR | Status: AC
Start: 2021-12-30 — End: 2021-12-30

## 2021-12-30 MED ORDER — PEPPERMINT SPIRIT SPRT
Status: AC
Start: 2021-12-30 — End: 2021-12-31
  Filled 2021-12-30: qty 30

## 2021-12-30 MED ORDER — IBUPROFEN 400 MG PO TABS
800.0000 mg | ORAL_TABLET | Freq: Once | ORAL | Status: AC
Start: 2021-12-30 — End: 2021-12-30
  Administered 2021-12-30: 800 mg via ORAL
  Filled 2021-12-30: qty 2

## 2021-12-30 MED ORDER — BENZOCAINE 20% +/- MENTHOL 0.5% EX AERO (WRAP)
1.0000 | INHALATION_SPRAY | CUTANEOUS | Status: DC | PRN
Start: 2021-12-30 — End: 2021-12-31

## 2021-12-30 MED ORDER — SODIUM CHLORIDE 0.9 % IV SOLN
1.5000 mg/kg | Freq: Three times a day (TID) | INTRAVENOUS | Status: DC
Start: 2021-12-30 — End: 2021-12-31
  Administered 2021-12-30: 140 mg via INTRAVENOUS
  Filled 2021-12-30 (×6): qty 3.5

## 2021-12-30 MED ORDER — ACETAMINOPHEN 500 MG PO TABS
1000.0000 mg | ORAL_TABLET | Freq: Four times a day (QID) | ORAL | Status: DC | PRN
Start: 2021-12-30 — End: 2021-12-31
  Administered 2021-12-31: 1000 mg via ORAL
  Filled 2021-12-30: qty 2

## 2021-12-30 MED ORDER — OXYCODONE HCL 5 MG PO TABS
5.0000 mg | ORAL_TABLET | ORAL | Status: DC | PRN
Start: 2021-12-30 — End: 2021-12-31

## 2021-12-30 MED ORDER — FENTANYL-BUPIVACAINE-NACL 0.2-0.125-0.9 MG/100ML-% EP SOLN
EPIDURAL | Status: DC
Start: 2021-12-30 — End: 2021-12-31
  Administered 2021-12-30: 12 mL/h via EPIDURAL
  Administered 2021-12-30: 100 mL via EPIDURAL
  Filled 2021-12-30 (×2): qty 100

## 2021-12-30 MED ORDER — BISACODYL 10 MG RE SUPP
10.0000 mg | Freq: Every day | RECTAL | Status: DC | PRN
Start: 2021-12-30 — End: 2021-12-31

## 2021-12-30 MED ORDER — ACETAMINOPHEN 650 MG RE SUPP
650.0000 mg | RECTAL | Status: DC | PRN
Start: 2021-12-30 — End: 2021-12-31

## 2021-12-30 MED ORDER — SIMETHICONE 80 MG PO CHEW
80.0000 mg | CHEWABLE_TABLET | Freq: Four times a day (QID) | ORAL | Status: DC | PRN
Start: 2021-12-30 — End: 2021-12-31

## 2021-12-30 MED ORDER — FENTANYL CITRATE (PF) 50 MCG/ML IJ SOLN (WRAP)
100.0000 ug | Freq: Once | INTRAMUSCULAR | Status: DC
Start: 2021-12-30 — End: 2021-12-31

## 2021-12-30 MED ORDER — MEASLES, MUMPS & RUBELLA VAC IJ SOLR
0.5000 mL | INTRAMUSCULAR | Status: DC | PRN
Start: 2021-12-30 — End: 2021-12-31

## 2021-12-30 MED ORDER — METHYLERGONOVINE MALEATE 0.2 MG/ML IJ SOLN
200.0000 ug | INTRAMUSCULAR | Status: DC | PRN
Start: 2021-12-30 — End: 2021-12-31

## 2021-12-30 MED ORDER — NALOXONE HCL 0.4 MG/ML IJ SOLN (WRAP)
0.1000 mg | INTRAMUSCULAR | Status: DC | PRN
Start: 2021-12-30 — End: 2021-12-31

## 2021-12-30 MED ORDER — OXYTOCIN 10 UNIT/ML IJ SOLN
10.0000 [IU] | Freq: Once | INTRAMUSCULAR | Status: DC | PRN
Start: 2021-12-30 — End: 2021-12-31

## 2021-12-30 MED ORDER — ACETAMINOPHEN 325 MG PO TABS
650.0000 mg | ORAL_TABLET | ORAL | Status: DC | PRN
Start: 2021-12-30 — End: 2021-12-31
  Administered 2021-12-30: 650 mg via ORAL
  Filled 2021-12-30: qty 2

## 2021-12-30 MED ORDER — PRENATAL PLUS IRON 29-1 MG PO TABS
1.0000 | ORAL_TABLET | Freq: Every day | ORAL | Status: DC
Start: 2021-12-31 — End: 2021-12-31

## 2021-12-30 MED ORDER — WITCH HAZEL EX PADS (WRAP)
1.0000 | MEDICATED_PAD | CUTANEOUS | Status: DC | PRN
Start: 2021-12-30 — End: 2021-12-31

## 2021-12-30 MED ORDER — STERILE WATER FOR INJECTION IJ/IV SOLN (WRAP)
2.0000 g | Freq: Once | INTRAMUSCULAR | Status: DC | PRN
Start: 2021-12-30 — End: 2021-12-30

## 2021-12-30 MED ORDER — LIDOCAINE-EPINEPHRINE 2 %-1:200000 IJ SOLN
INTRAMUSCULAR | Status: DC | PRN
Start: 2021-12-30 — End: 2021-12-30
  Administered 2021-12-30: 3 mL via EPIDURAL

## 2021-12-30 MED ORDER — METHYLERGONOVINE MALEATE 0.2 MG PO TABS
0.2000 mg | ORAL_TABLET | Freq: Four times a day (QID) | ORAL | Status: DC | PRN
Start: 2021-12-30 — End: 2021-12-31

## 2021-12-30 MED ORDER — LANOLIN EX OINT
TOPICAL_OINTMENT | CUTANEOUS | Status: DC | PRN
Start: 2021-12-30 — End: 2021-12-31

## 2021-12-30 MED ORDER — ONDANSETRON HCL 4 MG/2ML IJ SOLN
4.0000 mg | Freq: Three times a day (TID) | INTRAMUSCULAR | Status: DC | PRN
Start: 2021-12-30 — End: 2021-12-31

## 2021-12-30 MED ORDER — TERBUTALINE SULFATE 1 MG/ML IJ SOLN
0.2500 mg | Freq: Once | INTRAMUSCULAR | Status: DC | PRN
Start: 2021-12-30 — End: 2021-12-31

## 2021-12-30 MED ORDER — CARBOPROST TROMETHAMINE 250 MCG/ML IM SOLN
250.0000 ug | Freq: Once | INTRAMUSCULAR | Status: DC | PRN
Start: 2021-12-30 — End: 2021-12-31

## 2021-12-30 MED ORDER — FAMOTIDINE 20 MG PO TABS
20.0000 mg | ORAL_TABLET | Freq: Once | ORAL | Status: DC
Start: 2021-12-30 — End: 2021-12-31

## 2021-12-30 MED ORDER — SENNOSIDES-DOCUSATE SODIUM 8.6-50 MG PO TABS
1.0000 | ORAL_TABLET | Freq: Every evening | ORAL | Status: DC | PRN
Start: 2021-12-30 — End: 2021-12-31

## 2021-12-30 MED ORDER — IBUPROFEN 600 MG PO TABS
600.0000 mg | ORAL_TABLET | Freq: Four times a day (QID) | ORAL | Status: DC
Start: 2021-12-31 — End: 2021-12-31
  Administered 2021-12-31: 600 mg via ORAL

## 2021-12-30 MED ORDER — VANCOMYCIN 1000 MG IN 250 ML NS IVPB VIAL-MATE (CNR)
1000.0000 mg | Freq: Two times a day (BID) | INTRAVENOUS | Status: DC
Start: 2021-12-30 — End: 2021-12-31
  Administered 2021-12-30: 1000 mg via INTRAVENOUS
  Filled 2021-12-30: qty 1
  Filled 2021-12-30 (×2): qty 250

## 2021-12-30 MED ORDER — LIDOCAINE HCL (PF) 1 % IJ SOLN
10.0000 mL | INTRAMUSCULAR | Status: DC
Start: 2021-12-30 — End: 2021-12-31

## 2021-12-30 MED ORDER — LACTATED RINGERS IV SOLN
INTRAVENOUS | Status: DC
Start: 2021-12-30 — End: 2021-12-31
  Administered 2021-12-30: 1000 mL via INTRAVENOUS

## 2021-12-30 MED ORDER — MISOPROSTOL 200 MCG PO TABS
ORAL_TABLET | ORAL | Status: AC
Start: 2021-12-30 — End: ?
  Filled 2021-12-30: qty 4

## 2021-12-30 MED ORDER — AZITHROMYCIN 500 MG IN 250 ML NS IVPB VIAL-MATE (CNR)
500.0000 mg | Freq: Once | INTRAVENOUS | Status: DC | PRN
Start: 2021-12-30 — End: 2021-12-31

## 2021-12-30 MED ORDER — ACETAMINOPHEN 500 MG PO TABS
1000.0000 mg | ORAL_TABLET | Freq: Once | ORAL | Status: DC
Start: 2021-12-30 — End: 2021-12-31

## 2021-12-30 MED ORDER — ACETAMINOPHEN 650 MG RE SUPP
975.0000 mg | Freq: Four times a day (QID) | RECTAL | Status: DC | PRN
Start: 2021-12-30 — End: 2021-12-31

## 2021-12-30 MED ORDER — ONDANSETRON HCL 4 MG/2ML IJ SOLN
8.0000 mg | Freq: Three times a day (TID) | INTRAMUSCULAR | Status: DC | PRN
Start: 2021-12-30 — End: 2021-12-31

## 2021-12-30 MED ORDER — BUPIVACAINE HCL (PF) 0.25 % IJ SOLN
30.0000 mL | Freq: Once | INTRAMUSCULAR | Status: AC
Start: 2021-12-30 — End: 2021-12-30
  Administered 2021-12-30: 7 mL via EPIDURAL
  Filled 2021-12-30: qty 30

## 2021-12-30 MED ORDER — CALCIUM CARBONATE ANTACID 500 MG PO CHEW
1000.0000 mg | CHEWABLE_TABLET | Freq: Three times a day (TID) | ORAL | Status: DC | PRN
Start: 2021-12-30 — End: 2021-12-31

## 2021-12-30 MED ORDER — MISOPROSTOL 200 MCG PO TABS
400.0000 ug | ORAL_TABLET | Freq: Once | ORAL | Status: DC
Start: 2021-12-30 — End: 2021-12-30

## 2021-12-30 MED ORDER — FENTANYL CITRATE (PF) 50 MCG/ML IJ SOLN (WRAP)
100.0000 ug | INTRAMUSCULAR | Status: DC | PRN
Start: 2021-12-30 — End: 2021-12-31

## 2021-12-30 MED ORDER — LOPERAMIDE HCL 2 MG PO CAPS
2.0000 mg | ORAL_CAPSULE | Freq: Four times a day (QID) | ORAL | Status: DC | PRN
Start: 2021-12-30 — End: 2021-12-31
  Administered 2021-12-31: 2 mg via ORAL
  Filled 2021-12-30: qty 1

## 2021-12-30 MED ORDER — OXYCODONE HCL 5 MG PO TABS
5.0000 mg | ORAL_TABLET | Freq: Once | ORAL | Status: DC | PRN
Start: 2021-12-30 — End: 2021-12-31

## 2021-12-30 MED ORDER — METHYLERGONOVINE MALEATE 0.2 MG/ML IJ SOLN
0.2000 mg | Freq: Four times a day (QID) | INTRAMUSCULAR | Status: DC | PRN
Start: 2021-12-30 — End: 2021-12-31

## 2021-12-30 MED ORDER — ONDANSETRON 4 MG PO TBDP
4.0000 mg | ORAL_TABLET | Freq: Three times a day (TID) | ORAL | Status: DC | PRN
Start: 2021-12-30 — End: 2021-12-31

## 2021-12-30 MED ORDER — IBUPROFEN 600 MG PO TABS
600.0000 mg | ORAL_TABLET | Freq: Once | ORAL | Status: DC | PRN
Start: 2021-12-30 — End: 2021-12-31
  Filled 2021-12-30: qty 1

## 2021-12-30 NOTE — Anesthesia Preprocedure Evaluation (Signed)
Anesthesia Evaluation    AIRWAY    Mallampati: II    TM distance: >3 FB  Neck ROM: full  Mouth Opening:full   CARDIOVASCULAR    cardiovascular exam normal       DENTAL    no notable dental hx               PULMONARY    pulmonary exam normal     OTHER FINDINGS                                      Relevant Problems   No relevant active problems               Anesthesia Plan    ASA 2     epidural                     Detailed anesthesia plan: epidural            informed consent obtained      pertinent labs reviewed             Signed by: Cristela Blue, CRNA 12/30/21 12:28 PM

## 2021-12-30 NOTE — Plan of Care (Signed)
Patient is a G2X5 admitted for IUFD. Educated patient on the process and what to expect. Patient is showing signs of healthy grieving and has no pain or needs at this time. Call bell within reach. Plan to start induction once patient is epiduralized.   Problem: Loss of a fetus or infant/relinquishment of an infant  Goal: Parents will verbalize understanding of reasons for loss, when known  Outcome: Progressing  Goal: Parents will verbalize understanding of perinatal loss documentation and the process for disposition of the infant's body  Outcome: Progressing  Goal: When relinquishing an infant, patient will verbalize understanding of the foster/adoption process  Outcome: Progressing

## 2021-12-30 NOTE — Progress Notes (Signed)
OB PROGRESS NOTE    Date Time: 12/30/21 4:21 PM  Patient Name: Catherine Spence, Catherine Spence      Subjective:   Pt resting comfortably with PCE.  Consents to cervical exam and to place next Cytotec dose vaginally.  Pt is coping well with the situation, desires for [place] to limit discussing demise or giving her support/condolences.  Pt also does not want to see the baby and would like [place] to remove baby from room as soon as possible.      Objective:     Vitals:    12/30/21 1545   BP: 109/54   Pulse: 86   Resp:    Temp:    SpO2:        Cervical Exam: Dilation: Closed, Effacement (%): 0, Station: -3, cephalic    Contraction Frequency: none observed     Medication: 1300: Cytotec 800 mcg vaginally x 1                      1600 Cytotec 400 mcg vaginally x 1 just now by this CNM    Pain meds: Epidural in place          Assessment:   31 y.o. [id number] at Unknown  1. IOL for IUFD at 20.6  2. Membranes intact  3. VSS  4. Pt is coping well    Plan:   Cytotec placed vaginally, behind posterior fornix of cervix.    Anticipate active labor and SVD      Reassess in 3 hrs/prn.    Plan of care discussed with patient and family, patient in agreement, questions answered and addressed.     Signed by: Josph Macho, CNM 12/30/2021        OB

## 2021-12-30 NOTE — Progress Notes (Signed)
Lab called to obtain blood cultures.

## 2021-12-30 NOTE — Progress Notes (Signed)
Lab at bedside for blood culture

## 2021-12-30 NOTE — Progress Notes (Signed)
Pt temp now 38.3, pt received tylenol at 5:30, more likely from Cytotec due to prostaglandins.      Discussed pt assessment with Dr. Linna Caprice MD    Plan:   Repeat CBC  Blood culture x 2  Lactic acid    Begin Vancomycin for antibiotics post collection of blood cultures

## 2021-12-30 NOTE — Anesthesia Procedure Notes (Signed)
Epidural    Patient location during procedure: L&D  Reason for block: Labor or C-section  Block at Surgeon's request: Yes    Start time: 12/30/2021 12:37 PM  End time: 12/30/2021 12:38 PM    Staffing  Performed: resident/CRNA   Anesthesiologist: Colingo-Fahlberg, Robinette Haines, MD  Resident/CRNA: Cristela Blue, CRNA  Performed by: Cristela Blue, CRNA  Authorized by: Shana Chute, MD      Pre-procedure Checklist   Completed: patient identified, site marked, surgical consent, pre-op evaluation, timeout performed, risks and benefits discussed, monitors and equipment checked, anesthesia consent given and correct site  Timeout Completed:  12/30/2021 12:35 PM    Epidural  Patient monitoring: Pulse oximetry and NIBP    Premedication: Meaningful Contact Maintained  Patient position: sitting    Skin Local: lidocaine 1%  Dose: 3 mL    Attempts  Number of attempts: 1                    Successful attempt  Interspace: L3-4  Approach: midline    Needle type: Touhy needle   Needle gauge: 17  Injection technique: LOR saline  Epidural Space ID: 7 cm  CSF Return: No   Blood Return: No  Paresthesia Pain: No    Needle Placement  Needle type: Touhy needle   Needle gauge: 17  Injection technique: LOR saline  CSF Return: No  Blood Return: No          Paresthesia Pain: No    Catheter Placement   Catheter type: end hole  Catheter size: 19 G  Catheter at skin depth: 15 cm  CSF Return: No  Blood Return: No  Test Dose:1.5 % lidocaine and negative    Incremental injection: yes  Injection made incrementally with aspirations every 3 mL.    No Catheter IV/SA Signs or Symptoms    Assessment   Sensory level: T12  Patient tolerated procedure well: Yes  Block Outcome: no complications and successful block

## 2021-12-30 NOTE — Progress Notes (Signed)
OB PROGRESS NOTE    Date Time: 12/30/21 6:35 PM  Patient Name: Catherine Spence, Catherine Spence      Subjective:   Pt resting comfortably in bed, feeling like she needs to have a bowel movement and feels rectal pressure.  Consents to cervical exam.      Objective:     Vitals:    12/30/21 1800   BP: 98/61   Pulse: 87   Resp:    Temp: 100.2 F (37.9 C)   SpO2:        Cervical Exam: Dilation: 1, Effacement (%): 60, Station: -2, cephalic      Contraction Frequency: none observed     Medication:  Cytotec vaginally x 2, due again at 1900    Pain meds: Epidural in place          Assessment:   31 y.o. [id number] at [redacted]w[redacted]d  1. IOL for IUFD at 20.6 wks  2. Membranes intact  3. Pt coping well  4. VSS    Plan:   Pt has mildly elevated temp of 100.2, more likely from the Cytotec  Plan to give 3rd dose of Cytotec at 1900 as scheduled    Reassess in 3-4 hrs/prn.    Discussed patient assessment and plan of care with Dr. Dorris Carnes. Hawa MD, agrees with assessment and plan.      Plan of care discussed with patient and family, patient in agreement, questions answered and addressed.     Signed by: Josph Macho, CNM 12/30/2021        OB
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
## 2021-12-31 ENCOUNTER — Encounter: Payer: Self-pay | Admitting: Obstetrics & Gynecology

## 2021-12-31 ENCOUNTER — Ambulatory Visit: Payer: Self-pay

## 2021-12-31 ENCOUNTER — Ambulatory Visit (INDEPENDENT_AMBULATORY_CARE_PROVIDER_SITE_OTHER): Payer: Medicaid Other | Admitting: Pulmonary Disease

## 2021-12-31 MED ORDER — MEDROXYPROGESTERONE 150 MG/ML IM (WRAP)
150.0000 mg | Freq: Once | INTRAMUSCULAR | Status: AC
Start: 2021-12-31 — End: 2021-12-31
  Administered 2021-12-31: 150 mg via INTRAMUSCULAR
  Filled 2021-12-31: qty 1

## 2021-12-31 MED ORDER — ESCITALOPRAM OXALATE 10 MG PO TABS
5.0000 mg | ORAL_TABLET | Freq: Every day | ORAL | Status: DC
Start: 2021-12-31 — End: 2021-12-31
  Filled 2021-12-31 (×3): qty 1

## 2021-12-31 MED ORDER — HYDROXYZINE HCL 25 MG PO TABS
25.0000 mg | ORAL_TABLET | Freq: Four times a day (QID) | ORAL | Status: DC | PRN
Start: 2021-12-31 — End: 2021-12-31
  Filled 2021-12-31: qty 1

## 2021-12-31 MED ORDER — IBUPROFEN 800 MG PO TABS
800.0000 mg | ORAL_TABLET | Freq: Three times a day (TID) | ORAL | 0 refills | Status: DC | PRN
Start: 2021-12-31 — End: 2024-02-28

## 2021-12-31 MED ORDER — FLUTICASONE FUROATE-VILANTEROL 100-25 MCG/ACT IN AEPB
1.0000 | INHALATION_SPRAY | Freq: Every morning | RESPIRATORY_TRACT | Status: DC
Start: 2021-12-31 — End: 2021-12-31
  Administered 2021-12-31: 1 via RESPIRATORY_TRACT

## 2021-12-31 MED ORDER — DIPHENHYDRAMINE HCL 25 MG PO CAPS
25.0000 mg | ORAL_CAPSULE | Freq: Once | ORAL | Status: AC
Start: 2021-12-31 — End: 2021-12-31
  Administered 2021-12-31: 25 mg via ORAL
  Filled 2021-12-31: qty 1

## 2021-12-31 MED ORDER — FLUTICASONE FUROATE-VILANTEROL 100-25 MCG/ACT IN AEPB
1.0000 | INHALATION_SPRAY | Freq: Every morning | RESPIRATORY_TRACT | Status: DC
Start: 2021-12-31 — End: 2021-12-31
  Filled 2021-12-31: qty 14

## 2021-12-31 MED ORDER — ESCITALOPRAM OXALATE 10 MG PO TABS
5.0000 mg | ORAL_TABLET | Freq: Every day | ORAL | 0 refills | Status: DC
Start: 2021-12-31 — End: 2023-01-04

## 2021-12-31 MED ORDER — HYDROXYZINE HCL 25 MG PO TABS
25.0000 mg | ORAL_TABLET | Freq: Four times a day (QID) | ORAL | 6 refills | Status: DC | PRN
Start: 2021-12-31 — End: 2023-01-04

## 2021-12-31 NOTE — Discharge Summary (Signed)
[facility] POSTPARTUM DISCHARGE SUMMARY    Subjective:  Final diagnosis/Delivery Type: Vaginal   Birth Date: 12/30/2021    Delivery Time: 9:14 PM    Minimal bleeding  Ready for discharge  Has good support system:   [x]  YES  []  NO  History of depression, panic attacks or anxiety:   [x]  YES  []  NO  Pt desires to restart anti-depressant and hydroxyzine PRN for anxiety, pt has a hx of anxiety.    Pt desires Depo-provera for contraception.    Objective:  Vital Signs Stable:   Vitals:    12/31/21 0803   BP:    Pulse:    Resp:    Temp: 98.3 F (36.8 C)   SpO2:      Breast soft, non tender  Fundus firm, non tender  Extremities WNL  Perineum healing    Labs:  Results       Procedure Component Value Units Date/Time    Lina Sayre [[id number]] Collected: 12/30/21 1700    Specimen: Blood Updated: 12/31/21 0444     Kleihauer Betke Fetal Cells SEE BELOW     Kleihauer Betke Fetomaternal Hemorrhage 0 mL     Urine culture [[id number]] Collected: 12/30/21 1926    Specimen: Urine, Catheterized, Foley Updated: 12/31/21 0327    Narrative:      Indications for Urine Culture:->Neutropenia, Pregnancy, or  Undergoing Urologic Procedure    Culture Blood Aerobic and Anaerobic [[id number]] Collected: 12/30/21 2058    Specimen: Blood, Venipuncture Updated: 12/31/21 9518    Narrative:      The order will result in two separate 8-13ml bottles  Please do NOT order repeat blood cultures if one has been  drawn within the last 48 hours  UNLESS concerned for  endocarditis  AVOID BLOOD CULTURE DRAWS FROM CENTRAL LINE IF POSSIBLE  Indications:->Fever of greater than 101.5  1 BLUE+1 PURPLE    Culture Blood Aerobic and Anaerobic [[id number]] Collected: 12/30/21 2058    Specimen: Blood, Venipuncture Updated: 12/31/21 1601    Narrative:      The order will result in two separate 8-65ml bottles  Please do NOT order repeat blood cultures if one has been  drawn within the last 48 hours  UNLESS concerned for  endocarditis  AVOID BLOOD CULTURE DRAWS FROM CENTRAL  LINE IF POSSIBLE  Indications:->Fever of greater than 101.5  1 BLUE+1 PURPLE    Syphilis Screen IgG and IgM [[id number]] Collected: 12/30/21 1610     Updated: 12/30/21 2235     Syphilis Screen IgG and IgM Nonreactive    ANA IFA w reflex to Titer/Pattern/Antibody [[id number]] Collected: 12/30/21 1610     Updated: 12/30/21 2141    Lactic Acid [[id number]] Collected: 12/30/21 2058    Specimen: Blood Updated: 12/30/21 2124     Lactic Acid 0.9 mmol/L     Narrative:      Cancel second specimen if the initial lactate level is less  than 2.0 mEq/L.    Hemoglobin A1C [[id number]] Collected: 12/30/21 1610    Specimen: Blood Updated: 12/30/21 2118     Hemoglobin A1C 5.1 %      Average Estimated Glucose 99.7 mg/dL     CBC and differential [[id number]]  (Abnormal) Collected: 12/30/21 2058    Specimen: Blood Updated: 12/30/21 2116     WBC 10.44 x10 3/uL      Hgb 10.6 g/dL      Hematocrit 54.6 %      Platelets 250 x10 3/uL      RBC  4.14 x10 6/uL      MCV 78.7 fL      MCH 25.6 pg      MCHC 32.5 g/dL      RDW 16 %      MPV 9.5 fL      Instrument Absolute Neutrophil Count 6.97 x10 3/uL      Neutrophils 66.7 %      Lymphocytes Automated 26.7 %      Monocytes 5.3 %      Eosinophils Automated 0.6 %      Basophils Automated 0.2 %      Immature Granulocytes 0.5 %      Nucleated RBC 0.0 /100 WBC      Neutrophils Absolute 6.97 x10 3/uL      Lymphocytes Absolute Automated 2.79 x10 3/uL      Monocytes Absolute Automated 0.55 x10 3/uL      Eosinophils Absolute Automated 0.06 x10 3/uL      Basophils Absolute Automated 0.02 x10 3/uL      Immature Granulocytes Absolute 0.05 x10 3/uL      Absolute NRBC 0.00 x10 3/uL     Urinalysis Reflex to Microscopic Exam [[id number]]  (Abnormal) Collected: 12/30/21 1926    Specimen: Urine Updated: 12/30/21 1941     Urine Type Catheterized, F     Color, UA Colorless     Clarity, UA Clear     Specific Gravity UA 1.009     Urine pH 6.0     Leukocyte Esterase, UA Negative     Nitrite, UA Negative     Protein, UR Negative      Glucose, UA Negative     Ketones UA 40= 2+     Urobilinogen, UA Normal mg/dL      Bilirubin, UA Negative     Blood, UA Negative    Narrative:      Please reflex to culture regardless of UA pt is pregnant    Rapid drug screen, urine [[id number]] Collected: 12/30/21 1610    Specimen: Urine Updated: 12/30/21 1719     Urine Amphetamine Screen Negative     Barbiturate Screen, UR Negative     Benzodiazepine Screen, UR Negative     Cannabinoid Screen, UR Negative     Cocaine, UR Negative     Urine Fentanyl Negative     Opiate Screen, UR Negative     PCP Screen, UR Negative    Comprehensive metabolic panel [[id number]]  (Abnormal) Collected: 12/30/21 1610    Specimen: Blood Updated: 12/30/21 1713     Glucose 86 mg/dL      BUN 5.0 mg/dL      Creatinine 0.7 mg/dL      Sodium 235 mEq/L      Potassium 4.0 mEq/L      Chloride 109 mEq/L      CO2 21 mEq/L      Calcium 8.4 mg/dL      Protein, Total 5.7 g/dL      Albumin 2.9 g/dL      AST (SGOT) 7 U/L      ALT 5 U/L      Alkaline Phosphatase 46 U/L      Bilirubin, Total 0.3 mg/dL      Globulin 2.8 g/dL      Albumin/Globulin Ratio 1.0     Anion Gap 10.0     eGFR >60.0 mL/min/1.73 m2     Chromo Microarray Postnatal, Oligo-SNP [[id number]] Collected: 12/30/21 1610     Updated: 12/30/21 1644  Lupus anticoagulant [[id number]] Collected: 12/30/21 1610    Specimen: Blood Updated: 12/30/21 1644    Narrative:      Do not refrigerate.  Centrifuge and freeze plasma within 1 hour of collection.  Do not refrigerate.  Centrifuge and freeze plasma within 1 hour of collection.  Do not refrigerate.  Centrifuge and freeze plasma within 1 hour of collection.    Beta-2 Glycoprotein Antibodies [[id number]] Collected: 12/30/21 1610    Specimen: Blood Updated: 12/30/21 1644    Narrative:      Centrifuge within 60 minutes of collection  Centrifuge within 60 minutes of collection    Type and Screen [[id number]] Collected: 12/30/21 1206    Specimen: Blood Updated: 12/30/21 1308     ABO Rh O POS     AB Screen  Gel NEG    Narrative:      Pre-Surgical/Pre-Procedure->Yes  Has the patient been transfused w/i the last 3  months?->Unknown  Has patient been pregnant within past 3 months?->Yes  For transfusion?->No    CBC and differential [[id number]]  (Abnormal) Collected: 12/30/21 1206    Specimen: Blood Updated: 12/30/21 1218     WBC 8.67 x10 3/uL      Hgb 10.9 g/dL      Hematocrit 32.3 %      Platelets 255 x10 3/uL      RBC 4.29 x10 6/uL      MCV 78.6 fL      MCH 25.4 pg      MCHC 32.3 g/dL      RDW 16 %      MPV 9.8 fL      Instrument Absolute Neutrophil Count 5.87 x10 3/uL      Neutrophils 67.6 %      Lymphocytes Automated 24.5 %      Monocytes 5.9 %      Eosinophils Automated 1.3 %      Basophils Automated 0.2 %      Immature Granulocytes 0.5 %      Nucleated RBC 0.0 /100 WBC      Neutrophils Absolute 5.87 x10 3/uL      Lymphocytes Absolute Automated 2.12 x10 3/uL      Monocytes Absolute Automated 0.51 x10 3/uL      Eosinophils Absolute Automated 0.11 x10 3/uL      Basophils Absolute Automated 0.02 x10 3/uL      Immature Granulocytes Absolute 0.04 x10 3/uL      Absolute NRBC 0.00 x10 3/uL              Assessment/Plan:  Stable - Postpartum Day 1  Follow up in 1 weeks in office or sooner PRN  Call with fever, chills, HA, visual disturbances, s/s mastitis, increasing and severe pain, heavy bleeding, depression, s/s DVT, any questions or concerns  Take Ibuprofen/Motrin TID  PNV daily, Iron daily, and Lexapro daily, Hydroxyzine 25 mg q6 hr PRN for anxiety.    Depo-provera injection administered prior to discharge today  Written and verbal instructions given   Discharge home    Brief IP summary: Pt presented from the office to L and D for IOL due to IUFD at 20.6 wks.  She received an epidural for pain control and 3 doses of Cytotec.  She progressed to complete and delivered a demised female infant over an intact perineum.  Her intrapartum course was complicated by fever and was started on gentamicin. She has been afebrile since  delivery and feels well this morning.  She is stable and ready  for discharge home.      Signed by: Josph Macho, CNM 12/31/2021

## 2021-12-31 NOTE — UM Notes (Signed)
[facility] Utilization Review  [phone] [facility].  [place], [place] [address]  NPI # [id number]  Tax ID [id number]  Please call [phone] with any questions or concerns  Confidential Voice Mail  Fax final authorization and request for additional information to [phone]    Observation admission on 2023-01-07 for Fetal Demise Before 22 Weeks With Retention Of Dead Fetus   01-06-2022 1131  ADMIT TO OBSERVATION (OUTPATIENT WITH OBSERVATION SERVICES)  Once        Diagnosis: Fetal Demise Before 2022-08-31 Weeks With Retention Of Dead Fetus   Level of Care: Acute   Patient Class: Observation           On 07-Jan-2023  patient to OBED:    Catherine Spence is a 31 y.o. [id number], female with Estimated Date of Delivery: 05/14/22 at 20.[redacted] wks gestation who presents from the office for  fetal demise noted in office today . Confirmed by Dr. Harvest Dark, on arrival pt desires to repeat sono at bedside to confirm again that baby has no fetal heart beat.  I did so and confirmed again fetal demise.       Patient reports no contractions, no cramping, no leaking or LOF, no bleeding, no abnormal vaginal discharge, no dysuria, no headache, no vision changes, no nausea/vomiting, and good PO hydration    VS: 100.2 then 98.3 HR 78-83 RR 18 BP 123/63 Sat 99%    Labs: H/H 10/33 O+ BUN 5 WBC 1.44 BC x2 pending     UDS Neg  UA Ketones 2+    Rad:    MD assessment and Plan:    Assessment:   31 y.o. [id number] at [redacted]w[redacted]d  1. IOL for IUFD at 20.6 wks  2. Membranes intact  3. Pt coping well  4. VSS     Plan:   Pt has mildly elevated temp of 100.2, more likely from the Cytotec  Plan to give 3rd dose of Cytotec at 1900 as scheduled     Reassess in 3-4 hrs/prn.     Discussed patient assessment and plan of care with Dr. Dorris Carnes. Hawa MD, agrees with assessment and plan.      Orders:    Scheduled Meds:  Current Facility-Administered Medications   Medication Dose Route Frequency    acetaminophen  1,000 mg Oral Once    famotidine  20 mg Oral Q12H SCH    Or    famotidine  20 mg  Intravenous Q12H SCH    famotidine  20 mg Oral Once    fentaNYL (PF)  100 mcg Other Once    fluticasone furoate-vilanterol  1 puff Inhalation QAM    gentamicin  1.5 mg/kg (Adjusted) Intravenous Q8H    ibuprofen  600 mg Oral Q6H    lactated ringers  1,000 mL Intravenous Once    lidocaine (PF)  10-20 mL Intradermal Pre-Op    prenatal vitamin  1 tablet Oral Daily    vancomycin  1,000 mg Intravenous Q12H     Continuous Infusions:   fentaNYL 2 mcg/mL + bupivacaine 0.125 % Stopped (01/06/2022 2141)    lactated ringers 125 mL/hr at 01-06-22 1355    oxytocin Stopped (01-06-22 2151)     PRN Meds:. Imodium PO x1 Tylenol PO X1      Procedure:   Vaginal Delivery  Patient presented to labor and delivery for IOL for Fetal Demise      Anesthesia:   Epidural     Brief Notes:   Rupture:  Fluid color:        Birth Date: 12/30/2021   Delivery Time: 9:14 PM       Normal spontaneous vaginal birth of nonviable female child. Head delivered spontaneously. Nuchal cord x 1, Tight knot around the neck delivered with.  Shoulders and body birthed without difficulty. Baby to maternal abdomen. Cord clamped x2 and cut immediately by Dr Linna Caprice .  Placenta delivered spontaneously. Inspected, complete and intact with cord with normal cord insertion. FFU/U with massage.  Pitocin 30 units in 500 cc infusing for good uterine response.  Perineum inspected and perineum intact.   Count complete and correct.     APGAR:   1 Minute Apgar:     0   5 Minute Apgar:     0       Birth Weight:           Placenta Delivery Date:        Placenta Delivery Time:             Estimated Blood Loss:      Additional Notes:   Patient was GBS unknwown

## 2021-12-31 NOTE — Progress Notes (Signed)
Patient discharged home in stable condition. Patient vitals and assessment WNL. Patient reviewed POC with CNM and verbalized understanding. Patient given discharge instructions and educated on signs/symptoms to look for and follow up with provider. Bereavement pamphlet given to patient and all questions/concerns addressed at this time. Patient discharged to lobby with husband.

## 2021-12-31 NOTE — Discharge Instr - AVS First Page (Addendum)
After Your Delivery Discharge Instructions    Follow up appointments:    - 1-2 weeks for a mood check in if needed  -  6 weeks for postpartum check    Medications:    -Begin taking Lexapro 5 mg, take 1 tablet every day for 14 days then increase to two tablets every day  -Hydroxyzine 25 mg, take 1 tablet every 6 hours as needed for anxiety  - Motrin/Ibuprofen 800 mg 1 tablet every 8 hours for 3-5 days, then as needed  - Tylenol 500 mg, 2 tablets every 8 hours as needed for pain  - Colace 2 tablets twice a day as needed for constipation   - Continue to take your prenatal vitamin that contains iron every day as long as you are breastfeeding  - If not already taking, please take Vitamin D3 5,000 units every day for bone health  - You have anemia post your delivery, please take 1 tablet of vitron C daily, you can obtain this over the counter at any pharmacy, if you are unsure of which one to use please ask the pharmacist.  It is important that you take 1 tablet every day for at least 3 months so your iron stores are replenished as your body needs iron to make red blood cells.   If you take your prenatal that has iron in the morning take this in the evening or vice versa as your body can only absorb so much iron at one time.  It is important to also avoid taking dairy products at the same time as you take your iron as iron binds with the calcium in dairy products and is not absorbed properly.      Call if you experience any of the following:    1.  Heavy or prolonged vaginal bleeding (soaking a maxi pad every hour or passing clots larger than your fist).  2.  Your temperature is above 100 degrees F or if you have severe chills.  3.  Foul smelling vaginal discharge or difficulty urinating.  4.  Severe cramps and your pain is not relieved by medication, hot shower, rest, hydration.  5.  Breasts reddened, hard, hot to the touch.  Nipple discharge which is foul-smelling or contains pus.  6.  Severe headache, blurry vision, or  epigastric pain.  7.  New calf pain especially if only on one side, or redness/swelling on one leg  8.  Any signs or symptoms of depression, severe anxiety, crying every day, feeling overwhelmed, insomnia, or thoughts of harming yourself or others.  9.  You have any other questions or problems.    What to do at home:      Nothing in the vagina for 6 weeks.  Avoid intercourse, douches, or tampons until follow up postpartum visit in 6 weeks.    Avoid strenuous activity for two weeks. Do not lift more than 10 pounds. Take care of yourself by sleeping/resting as much as possible.      Menstrual-like bleeding and spotting is very common in the first few weeks.  If you are still having vaginal bleeding at 1 month after birth, if you ever pass a clot larger than your fist, and if your bleeding ever has a bad odor, please call our office.    Eat regular nutritious meals, including healthy fats, protein, and fiber. Drink lots of water, at least one gallon a day.

## 2022-01-01 NOTE — Anesthesia Postprocedure Evaluation (Signed)
Anesthesia Post Evaluation    Patient: Catherine Spence    * No procedures listed *    Anesthesia type: No value filed.    Last Vitals:   Vitals Value Taken Time   BP  01/01/22 1206   Temp  01/01/22 1206   Pulse  01/01/22 1206   Resp  01/01/22 1206   SpO2  01/01/22 1206                 Anesthesia Post Evaluation    Signed by: Shana Chute, MD, 01/01/2022 12:06 PM

## 2022-01-03 LAB — LAB USE ONLY - HISTORICAL SURGICAL PATHOLOGY

## 2022-01-05 ENCOUNTER — Telehealth (INDEPENDENT_AMBULATORY_CARE_PROVIDER_SITE_OTHER): Payer: Self-pay | Admitting: Pulmonary Disease

## 2022-01-05 NOTE — Telephone Encounter (Signed)
Left vm to r/s appt that was no showed, per Vernona Rieger and Dr. Lynford Humphrey. Office  cancelled instead of no showing.

## 2022-01-13 LAB — CHROMOSOME MICROARRAY POSTNATAL, OLIGO-SNP

## 2022-01-14 NOTE — UM Notes (Signed)
[facility] [phone] and additional  information can be requested by calling  UR Main Line: [phone]    Patient Name: Catherine Spence       DOB: 04/16/1990  MRN: [id number]      Auth Number :  [id number]    Discharge Date -  12/31/2021  9:37 AM    ADMIT DATE AND TIME: 12/30/2021 11:21 AM    12/30/21 2130  ADMIT TO INPATIENT  Once        Diagnosis: Fetal Demise Before 22 Weeks With Retention Of Dead Fetus   Level of Care: Intermediate Care   Patient Class: Inpatient    References:    [facility] Bed Placement Criteria    [facility]    [facility]    [facility]    [facility]    New Service Definitions Feb 2023   Question Answer Comment   Admitting Physician Linna CapriceHAWA, NADIM N    Service: OB Post Partum    Estimated Length of Stay > or = to 2 midnights    Tentative Discharge Plan? Home or Self Care        12/30/21 2131   12/30/21 1131  ADMIT TO OBSERVATION (OUTPATIENT WITH OBSERVATION SERVICES)  Once               NAME: Catherine Spence             MR#: [id number]    Patient Address:  [address]  [place] [id number]    Patient phone: [phone] (home)     PATIENT NAME: Catherine CottaJENKINS,Catherine Spence  DOB: 12/28/1990   PMH:  has a past medical history of Anesthesia complication, Anxiety, Asthma, Bipolar 1 disorder, Chronic pain, COVID-19 (04/2020), Depression, Dysphagia, Ear, nose and throat disorder, Fracture, Gastroesophageal reflux disease, Head injury, Hearing loss, Heart murmur, Hernia, umbilical, Herpes, History of mixed drug abuse, HPV in female (2013), Hypertension, Low back pain, Malignant neoplasm, Migraine, Other and unspecified ovarian cysts, Ovarian cyst, Pollen allergies, Post-operative nausea and vomiting, Postpartum depression, PTSD (post-traumatic stress disorder), and Urinary tract infection.  PSH:  has a past surgical history that includes Vaginal delivery (10/04/2016); Tympanostomy tube  placement; LEEP (N/A, 01/13/2017); and ROBOT XI ASSISTED,LAPAROSCOPIC,HERNIA REPAIR,UMBILICAL (N/A, 12/19/2019).    BP 129/76   Pulse 78   Temp 98.3 F (36.8 C) (Oral)   Resp 18   Ht 1.676 m (5\' 6" )   Wt 134.3 kg (296 lb)   SpO2 99%   BMI 47.78 kg/m       DIAGNOSIS:     ICD-10-CM    1. Fetal demise before 22 weeks with retention of dead fetus  16O36.4XX0         Delivery Report  Procedure:  Vaginal Delivery  Patient presented to labor and delivery for IOL for Fetal Demise   Anesthesia:  Epidural  Brief Notes:  Rupture:      Fluid color:     Birth Date: 12/30/2021   Delivery Time: 9:14 PM    Normal spontaneous vaginal birth of nonviable female child  Gestational Age: 4164w5d  APGAR:   1 Minute Apgar:     0   5 Minute Apgar:     0   Weight: 11.4 oz (323 g)          OB 12/31/2021  Assessment/Plan:  Stable - Postpartum Day 1  Follow up in 1 weeks in  office or sooner PRN  Call with fever, chills, HA, visual disturbances, s/s mastitis, increasing and severe pain, heavy bleeding, depression, s/s DVT, any questions or concerns  Take Ibuprofen/Motrin TID  PNV daily, Iron daily, and Lexapro daily, Hydroxyzine 25 mg q6 hr PRN for anxiety.    Depo-provera injection administered prior to discharge today  Written and verbal instructions given   Discharge home  Brief IP summary: Pt presented from the office to L and D for IOL due to IUFD at 20.6 wks.  She received an epidural for pain control and 3 doses of Cytotec.  She progressed to complete and delivered a demised female infant over an intact perineum.  Her intrapartum course was complicated by fever and was started on gentamicin. She has been afebrile since delivery and feels well this morning.  She is stable and ready for discharge home.        This clinical review is based on compiled documentation provided by the treatment team within the patient's medical record.      UTILIZATION REVIEW CONTACT :   Ronalee Belts, MSN, RN, ACM  Utilization Review Case Manager ll   Phone:  [phone] (vm only)  Main Line: [phone]  Fax Line [phone]  Cell [phone] (8a-5p)  [email or web address]    Tax ID: [id number]  [facility]   (IAH) [facility]   (IFH) [facility]   [facility]) [facility]   [facility])  [facility]  [facility])   [address]  [place], [place] [address] [address] [place], [place] [address] [address]  [place], [place] [address] [phone] [facility].  [place], [place] [address] [address]  [place], [place] [address]   NPI: [id number] NPI: [id number] NPI: [id number] NPI: [id number] NPI: [id number]

## 2022-01-18 ENCOUNTER — Telehealth: Payer: Self-pay

## 2022-01-18 NOTE — Telephone Encounter (Signed)
Email sent to patient for bereavement follow up.  Reviewed "Understanding your loss" booklet contact information for local support groups, counselors and online support groups.  Provided bereavement phone number and email for future contact.

## 2022-05-10 ENCOUNTER — Emergency Department: Payer: Medicaid Other

## 2022-05-10 ENCOUNTER — Emergency Department
Admission: EM | Admit: 2022-05-10 | Discharge: 2022-05-10 | Disposition: A | Discharge status: Home / Self Care | Payer: Medicaid Other | Attending: Emergency Medicine | Admitting: Emergency Medicine

## 2022-05-10 DIAGNOSIS — F17210 Nicotine dependence, cigarettes, uncomplicated: Secondary | ICD-10-CM | POA: Insufficient documentation

## 2022-05-10 DIAGNOSIS — R102 Pelvic and perineal pain: Secondary | ICD-10-CM | POA: Insufficient documentation

## 2022-05-10 DIAGNOSIS — R1909 Other intra-abdominal and pelvic swelling, mass and lump: Secondary | ICD-10-CM | POA: Insufficient documentation

## 2022-05-10 DIAGNOSIS — M6208 Separation of muscle (nontraumatic), other site: Secondary | ICD-10-CM | POA: Insufficient documentation

## 2022-05-10 DIAGNOSIS — R1030 Lower abdominal pain, unspecified: Secondary | ICD-10-CM

## 2022-05-10 DIAGNOSIS — R519 Headache, unspecified: Secondary | ICD-10-CM | POA: Insufficient documentation

## 2022-05-10 DIAGNOSIS — R1032 Left lower quadrant pain: Secondary | ICD-10-CM | POA: Insufficient documentation

## 2022-05-10 DIAGNOSIS — R1031 Right lower quadrant pain: Secondary | ICD-10-CM | POA: Insufficient documentation

## 2022-05-10 DIAGNOSIS — R350 Frequency of micturition: Secondary | ICD-10-CM | POA: Insufficient documentation

## 2022-05-10 DIAGNOSIS — N9489 Other specified conditions associated with female genital organs and menstrual cycle: Secondary | ICD-10-CM

## 2022-05-10 HISTORY — DX: Irritable bowel syndrome, unspecified: K58.9

## 2022-05-10 LAB — URINALYSIS WITH REFLEX TO MICROSCOPIC EXAM - REFLEX TO CULTURE
Bilirubin, UA: NEGATIVE
Blood, UA: NEGATIVE
Glucose, UA: NEGATIVE
Ketones UA: NEGATIVE
Nitrite, UA: NEGATIVE
Protein, UR: NEGATIVE
Specific Gravity UA: 1.008 (ref 1.001–1.035)
Urine pH: 6.5 (ref 5.0–8.0)
Urobilinogen, UA: NORMAL mg/dL (ref 0.2–2.0)

## 2022-05-10 LAB — CBC AND DIFFERENTIAL
Absolute NRBC: 0 10*3/uL (ref 0.00–0.00)
Basophils Absolute Automated: 0.02 10*3/uL (ref 0.00–0.08)
Basophils Automated: 0.3 %
Eosinophils Absolute Automated: 0.12 10*3/uL (ref 0.00–0.44)
Eosinophils Automated: 1.5 %
Hematocrit: 38.6 % (ref 34.7–43.7)
Hgb: 12.2 g/dL (ref 11.4–14.8)
Immature Granulocytes Absolute: 0.02 10*3/uL (ref 0.00–0.07)
Immature Granulocytes: 0.3 %
Instrument Absolute Neutrophil Count: 4.36 10*3/uL (ref 1.10–6.33)
Lymphocytes Absolute Automated: 2.81 10*3/uL (ref 0.42–3.22)
Lymphocytes Automated: 35.8 %
MCH: 24.1 pg — ABNORMAL LOW (ref 25.1–33.5)
MCHC: 31.6 g/dL (ref 31.5–35.8)
MCV: 76.3 fL — ABNORMAL LOW (ref 78.0–96.0)
MPV: 9.2 fL (ref 8.9–12.5)
Monocytes Absolute Automated: 0.53 10*3/uL (ref 0.21–0.85)
Monocytes: 6.7 %
Neutrophils Absolute: 4.36 10*3/uL (ref 1.10–6.33)
Neutrophils: 55.4 %
Nucleated RBC: 0 /100 WBC (ref 0.0–0.0)
Platelets: 287 10*3/uL (ref 142–346)
RBC: 5.06 10*6/uL (ref 3.90–5.10)
RDW: 15 % (ref 11–15)
WBC: 7.86 10*3/uL (ref 3.10–9.50)

## 2022-05-10 LAB — COMPREHENSIVE METABOLIC PANEL
ALT: 20 U/L (ref 0–55)
AST (SGOT): 14 U/L (ref 5–41)
Albumin/Globulin Ratio: 1.2 (ref 0.9–2.2)
Albumin: 3.6 g/dL (ref 3.5–5.0)
Alkaline Phosphatase: 69 U/L (ref 37–117)
Anion Gap: 8 (ref 5.0–15.0)
BUN: 9 mg/dL (ref 7.0–21.0)
Bilirubin, Total: 0.3 mg/dL (ref 0.2–1.2)
CO2: 24 mEq/L (ref 17–29)
Calcium: 9 mg/dL (ref 8.5–10.5)
Chloride: 107 mEq/L (ref 99–111)
Creatinine: 0.8 mg/dL (ref 0.4–1.0)
Globulin: 3.1 g/dL (ref 2.0–3.6)
Glucose: 89 mg/dL (ref 70–100)
Potassium: 4.4 mEq/L (ref 3.5–5.3)
Protein, Total: 6.7 g/dL (ref 6.0–8.3)
Sodium: 139 mEq/L (ref 135–145)
eGFR: >60 mL/min/{1.73_m2} (ref >=60)

## 2022-05-10 LAB — LIPASE: Lipase: 23 U/L (ref 8–78)

## 2022-05-10 LAB — COVID-19 (SARS-COV-2) & INFLUENZA  A/B, NAA (ROCHE LIAT)
Influenza A: NOT DETECTED
Influenza B: NOT DETECTED
SARS-CoV-2 Overall Result: NOT DETECTED

## 2022-05-10 LAB — SERUM HCG, QUALITATIVE: Hcg Qualitative: NEGATIVE

## 2022-05-10 MED ORDER — IOHEXOL 350 MG/ML IV SOLN
100.0000 mL | Freq: Once | INTRAVENOUS | Status: AC | PRN
Start: 2022-05-10 — End: 2022-05-10
  Administered 2022-05-10: 100 mL via INTRAVENOUS

## 2022-05-10 MED ORDER — IOHEXOL FLAVORED BEVERAGE PO
1000.0000 mL | Freq: Once | ORAL | Status: AC | PRN
Start: 2022-05-10 — End: 2022-05-10

## 2022-05-10 MED ORDER — KETOROLAC TROMETHAMINE 30 MG/ML IJ SOLN
30.0000 mg | Freq: Once | INTRAMUSCULAR | Status: AC
Start: 2022-05-10 — End: 2022-05-10
  Administered 2022-05-10: 30 mg via INTRAVENOUS
  Filled 2022-05-10: qty 1

## 2022-05-10 MED ORDER — IOHEXOL 9 MG/ML PO SOLN
1333.0000 mL | Freq: Once | ORAL | Status: AC | PRN
Start: 2022-05-10 — End: 2022-05-10

## 2022-05-10 MED ORDER — DOXYCYCLINE MONOHYDRATE 100 MG PO CAPS
100.0000 mg | ORAL_CAPSULE | Freq: Once | ORAL | Status: AC
Start: 2022-05-10 — End: 2022-05-10
  Administered 2022-05-10: 100 mg via ORAL
  Filled 2022-05-10: qty 1

## 2022-05-10 MED ORDER — SODIUM CHLORIDE 0.9 % IV BOLUS
1000.0000 mL | Freq: Once | INTRAVENOUS | Status: AC
Start: 2022-05-10 — End: 2022-05-10
  Administered 2022-05-10: 1000 mL via INTRAVENOUS

## 2022-05-10 MED ORDER — DOXYCYCLINE HYCLATE 100 MG PO TABS
100.0000 mg | ORAL_TABLET | Freq: Two times a day (BID) | ORAL | 0 refills | Status: AC
Start: 2022-05-10 — End: 2022-05-17

## 2022-05-10 MED ORDER — IOHEXOL 12 MG/ML PO SOLN
1000.0000 mL | Freq: Once | ORAL | Status: AC | PRN
Start: 2022-05-10 — End: 2022-05-10
  Administered 2022-05-10: 1000 mL via ORAL

## 2022-05-10 MED ORDER — DIATRIZOATE MEGLUMINE & SODIUM 66-10 % PO SOLN
400.0000 mL | Freq: Once | ORAL | Status: AC | PRN
Start: 2022-05-10 — End: 2022-05-10

## 2022-05-10 MED ORDER — DOXYCYCLINE HYCLATE 100 MG PO TABS
100.0000 mg | ORAL_TABLET | Freq: Two times a day (BID) | ORAL | 0 refills | Status: DC
Start: 2022-05-10 — End: 2022-05-10

## 2022-05-10 NOTE — ED Notes (Signed)
PO contrast done at 1250

## 2022-05-10 NOTE — ED Provider Notes (Signed)
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 progressed to complete and delivered a demised female infant over an intact perineum.  Her intrapartum course was complicated by fever and was started on gentamicin. She has been afebrile since delivery and feels well this morning.  She is stable and ready for discharge home.       Signed by: Brien Few, CNM 12/31/2021       Independent visualized and interpretation of radiological study by me:     Type of radiological study performed : Abdomen pelvis CT  Independent Interpretation by me: No ureteral stone    Diagnostic tests appropriately considered even if not ultimately performed: additional labs and imaging     Discussion of management with other physicians, NP, APP and/or other caretakers:   Discussion with dispatch health and recommendation(s) were will follow-up. Patient condition and all pertinent labs and/or radiology studies discussed.     ______________________________________________________________________    Risk of Complications and/or Morbidity or Mortality of Patient Management  (select one if applicable)    Risk: High Risk                         Results       Procedure Component Value Units Date/Time    COVID-19 (SARS-CoV-2) and Influenza A/B, NAA ([facility] Rapid) [id number] Collected: 05/10/22 1146    Specimen: Culturette from Nasopharyngeal Updated: 05/10/22 1214      Purpose of COVID testing Diagnostic -PUI     SARS-CoV-2 Specimen Source Nasal Swab     SARS CoV 2 Overall Result Not Detected     Influenza A Not Detected     Influenza B Not Detected    Narrative:      o Collect and clearly label specimen type:  o PREFERRED-Upper respiratory specimen: One Nasal Swab in  Transport Media.  o Hand deliver to laboratory ASAP  Diagnostic -PUI    Comprehensive metabolic panel [id number] Collected: 05/10/22 1146    Specimen: Blood Updated: 05/10/22 1208     Glucose 89 mg/dL      BUN 9.0 mg/dL      Creatinine 0.8 mg/dL      Sodium 139 mEq/L      Potassium 4.4 mEq/L      Chloride 107 mEq/L      CO2 24 mEq/L      Calcium 9.0 mg/dL      Protein, Total 6.7 g/dL      Albumin 3.6 g/dL      AST (SGOT) 14 U/L      ALT 20 U/L      Alkaline Phosphatase 69 U/L      Bilirubin, Total 0.3 mg/dL      Globulin 3.1 g/dL      Albumin/Globulin Ratio 1.2     Anion Gap 8.0     eGFR >60.0 mL/min/1.73 m2     Lipase [id number] Collected: 05/10/22 1146    Specimen: Blood Updated: 05/10/22 1208     Lipase 23 U/L     Urinalysis Reflex to Microscopic Exam- Reflex to Culture [id number]  (Abnormal) Collected: 05/10/22 1146  Updated: 05/10/22 1205     Urine Type Urine, Clean Ca     Color, UA Straw     Clarity, UA Hazy     Specific Gravity UA 1.008     Urine pH 6.5     Leukocyte Esterase, UA Small     Nitrite, UA Negative     Protein, UR Negative     Glucose, UA Negative     Ketones UA Negative     Urobilinogen, UA Normal mg/dL      Bilirubin, UA Negative     Blood, UA Negative     RBC, UA 0-2 /hpf      WBC, UA 11-25 /hpf      Squamous Epithelial Cells, Urine 11-25 /hpf     Beta HCG, Qual, Serum [id number] Collected: 05/10/22 1146    Specimen: Blood Updated: 05/10/22 1204     Hcg Qualitative Negative    CBC and differential [id number]  (Abnormal) Collected: 05/10/22 1146    Specimen: Blood Updated: 05/10/22 1155     WBC 7.86 x10 3/uL      Hgb 12.2 g/dL      Hematocrit 38.6 %      Platelets 287 x10 3/uL      RBC  5.06 x10 6/uL      MCV 76.3 fL      MCH 24.1 pg      MCHC 31.6 g/dL      RDW 15 %      MPV 9.2 fL      Instrument Absolute Neutrophil Count 4.36 x10 3/uL      Neutrophils 55.4 %      Lymphocytes Automated 35.8 %      Monocytes 6.7 %      Eosinophils Automated 1.5 %      Basophils Automated 0.3 %      Immature Granulocytes 0.3 %      Nucleated RBC 0.0 /100 WBC      Neutrophils Absolute 4.36 x10 3/uL      Lymphocytes Absolute Automated 2.81 x10 3/uL      Monocytes Absolute Automated 0.53 x10 3/uL      Eosinophils Absolute Automated 0.12 x10 3/uL      Basophils Absolute Automated 0.02 x10 3/uL      Immature Granulocytes Absolute 0.02 x10 3/uL      Absolute NRBC 0.00 x10 3/uL             Radiology Results (24 Hour)       Procedure Component Value Units Date/Time    CT Abdomen Pelvis W IV And PO Cont [id number] Collected: 05/10/22 1414    Order Status: Completed Updated: 05/10/22 1421    Narrative:      HISTORY: Lower abdominal pain.     COMPARISON: 01/27/2020    TECHNIQUE: CT of the abdomen and pelvis performed with intravenous  contrast. The following dose reduction techniques were utilized: automated  exposure control and/or adjustment of the mA and/or KV according to patient  size, and the use of an iterative reconstruction technique.    CONTRAST: iohexol (OMNIPAQUE) 350 MG/ML injection 100 mL    FINDINGS:  The liver, gallbladder, biliary tree, pancreas, spleen, adrenal glands, and  kidneys are normal. No hydroureteronephrosis. Aorta is normal caliber. No  adenopathy or ascites.    The bowel is nondistended. Normal appendix is seen at the right lower  quadrant.    A small small rounded focus of fat is seen along the umbilicus. Mild rectus  abdominis  diastases is unchanged.    A cystic structure within the right adnexa measures water attenuation and  4.6 x 5.5 cm in size previously 4.3 x 5.0 cm at time of CT 01/27/2020.  Bladder and remainder of the pelvic structures are otherwise unremarkable.  No suspicious osseous  lesions.      Impression:        Enlarging right adnexal cystic lesion since 2021. Recommend correlation  with pelvic sonography.    Verner Mould, MD  05/10/2022 2:19 PM              Clinical Impression & Disposition     Clinical Impression  Final diagnoses:   Urinary frequency   Diastasis recti   Adnexal mass   Lower abdominal pain   Acute nonintractable headache, unspecified headache type        ED Disposition       ED Disposition   Discharge    Condition   --    Date/Time   Tue May 10, 2022  3:12 PM    Comment   Catherine Spence discharge to home/self care.    Condition at disposition: Stable                  Discharge Medication List as of 05/10/2022  3:12 PM        START taking these medications    Details   doxycycline (VIBRA-TABS) 100 MG tablet Take 1 tablet (100 mg) by mouth 2 (two) times daily for 7 days, Starting Tue 05/10/2022, Until Tue 05/17/2022, E-Rx                         Concaugh-Gruendel, Georgia Dom, PA  05/10/22 1546

## 2022-05-10 NOTE — ED Notes (Signed)
PO contrast started at 1154.

## 2022-05-10 NOTE — Discharge Instructions (Addendum)
Please rest and avoid aggravating activities. Please follow-up. With your OB/GYN.  Return to the ER for any concerns.

## 2022-05-11 NOTE — Progress Notes (Signed)
Ucx neg. , pt had urinary ssx, and wbc pyuria /, and squamous epilethial cells. Recommend to take doxy for 5 days instead of 7 days.     Note sent pt saw results   Your urine culture is negative. Recommend to take antibiotics for 5 days and discuss with your primary care doctor. If persistent urine symptoms , you can take the full 7 day course and repeat urine after treatment with your primary care doctor.     If you have any questions recommend to contact your primary care doctor and /or [facility] at [phone] Please contact your primary care doctor within a week. If worsening signs and symptoms return to ED    Please do not respond to this email.     Bethann Berkshire, MD  [facility]  [facility].

## 2022-11-14 ENCOUNTER — Emergency Department
Admission: EM | Admit: 2022-11-14 | Discharge: 2022-11-15 | Disposition: A | Discharge status: Home / Self Care | Payer: Medicaid Other | Attending: Emergency Medicine | Admitting: Emergency Medicine

## 2022-11-14 ENCOUNTER — Emergency Department: Payer: Medicaid Other

## 2022-11-14 DIAGNOSIS — R509 Fever, unspecified: Secondary | ICD-10-CM | POA: Insufficient documentation

## 2022-11-14 DIAGNOSIS — F17210 Nicotine dependence, cigarettes, uncomplicated: Secondary | ICD-10-CM | POA: Insufficient documentation

## 2022-11-14 DIAGNOSIS — U071 COVID-19: Secondary | ICD-10-CM | POA: Insufficient documentation

## 2022-11-14 DIAGNOSIS — I10 Essential (primary) hypertension: Secondary | ICD-10-CM | POA: Insufficient documentation

## 2022-11-14 DIAGNOSIS — R591 Generalized enlarged lymph nodes: Secondary | ICD-10-CM | POA: Insufficient documentation

## 2022-11-14 DIAGNOSIS — R6510 Systemic inflammatory response syndrome (SIRS) of non-infectious origin without acute organ dysfunction: Secondary | ICD-10-CM | POA: Insufficient documentation

## 2022-11-14 DIAGNOSIS — R590 Localized enlarged lymph nodes: Secondary | ICD-10-CM

## 2022-11-14 DIAGNOSIS — R6889 Other general symptoms and signs: Secondary | ICD-10-CM

## 2022-11-14 LAB — LAB USE ONLY - CBC WITH DIFFERENTIAL
Absolute Basophils: 0.02 10*3/uL (ref 0.00–0.08)
Absolute Eosinophils: 0.07 10*3/uL (ref 0.00–0.44)
Absolute Immature Granulocytes: 0.05 10*3/uL (ref 0.00–0.07)
Absolute Lymphocytes: 1.49 10*3/uL (ref 0.42–3.22)
Absolute Monocytes: 0.65 10*3/uL (ref 0.21–0.85)
Absolute Neutrophils: 9.55 10*3/uL — ABNORMAL HIGH (ref 1.10–6.33)
Absolute nRBC: 0 10*3/uL (ref <=0.00)
Basophils %: 0.2 %
Eosinophils %: 0.6 %
Hematocrit: 37.3 % (ref 34.7–43.7)
Hemoglobin: 11.7 g/dL (ref 11.4–14.8)
Immature Granulocytes %: 0.4 %
Lymphocytes %: 12.6 %
MCH: 24.3 pg — ABNORMAL LOW (ref 25.1–33.5)
MCHC: 31.4 g/dL — ABNORMAL LOW (ref 31.5–35.8)
MCV: 77.5 fL — ABNORMAL LOW (ref 78.0–96.0)
MPV: 9.4 fL (ref 8.9–12.5)
Monocytes %: 5.5 %
Neutrophils %: 80.7 %
Platelet Count: 262 10*3/uL (ref 142–346)
Preliminary Absolute Neutrophil Count: 9.55 10*3/uL — ABNORMAL HIGH (ref 1.10–6.33)
RBC: 4.81 10*6/uL (ref 3.90–5.10)
RDW: 14 % (ref 11–15)
WBC: 11.83 10*3/uL — ABNORMAL HIGH (ref 3.10–9.50)
nRBC %: 0 /100 WBC (ref <=0.0)

## 2022-11-14 LAB — COMPREHENSIVE METABOLIC PANEL
ALT: 15 U/L (ref 0–55)
AST (SGOT): 12 U/L (ref 5–41)
Albumin/Globulin Ratio: 1.2 (ref 0.9–2.2)
Albumin: 3.5 g/dL (ref 3.5–5.0)
Alkaline Phosphatase: 72 U/L (ref 37–117)
Anion Gap: 11 (ref 5.0–15.0)
BUN: 15 mg/dL (ref 7–21)
Bilirubin, Total: 0.3 mg/dL (ref 0.2–1.2)
CO2: 23 mEq/L (ref 17–29)
Calcium: 8.8 mg/dL (ref 8.5–10.5)
Chloride: 106 mEq/L (ref 99–111)
Creatinine: 0.8 mg/dL (ref 0.4–1.0)
GFR: >60 mL/min/{1.73_m2} (ref >=60.0)
Globulin: 2.9 g/dL (ref 2.0–3.6)
Glucose: 107 mg/dL — ABNORMAL HIGH (ref 70–100)
Potassium: 4 mEq/L (ref 3.5–5.3)
Protein, Total: 6.4 g/dL (ref 6.0–8.3)
Sodium: 140 mEq/L (ref 135–145)

## 2022-11-14 LAB — SERUM HCG, QUALITATIVE: hCG Qualitative: NEGATIVE

## 2022-11-14 MED ORDER — ONDANSETRON 4 MG PO TBDP
4.0000 mg | ORAL_TABLET | Freq: Four times a day (QID) | ORAL | 0 refills | Status: DC | PRN
Start: 2022-11-14 — End: 2022-11-15

## 2022-11-14 MED ORDER — BENZONATATE 100 MG PO CAPS
100.0000 mg | ORAL_CAPSULE | Freq: Three times a day (TID) | ORAL | 0 refills | Status: AC | PRN
Start: 2022-11-14 — End: ?

## 2022-11-14 MED ORDER — IBUPROFEN 600 MG PO TABS
600.0000 mg | ORAL_TABLET | Freq: Once | ORAL | Status: AC
Start: 2022-11-14 — End: 2022-11-14
  Administered 2022-11-14: 600 mg via ORAL
  Filled 2022-11-14: qty 1

## 2022-11-14 MED ORDER — ACETAMINOPHEN 325 MG PO TABS
650.0000 mg | ORAL_TABLET | Freq: Once | ORAL | Status: AC
Start: 2022-11-14 — End: 2022-11-14
  Administered 2022-11-14: 650 mg via ORAL
  Filled 2022-11-14: qty 2

## 2022-11-14 MED ORDER — NIRMATRELVIR&RITONAVIR 300/100 20 X 150 MG & 10 X 100MG PO TBPK
3.0000 | ORAL_TABLET | Freq: Two times a day (BID) | ORAL | 0 refills | Status: AC
Start: 2022-11-14 — End: 2022-11-19

## 2022-11-14 MED ORDER — SODIUM CHLORIDE 0.9 % IV BOLUS
1000.0000 mL | Freq: Once | INTRAVENOUS | Status: AC
Start: 2022-11-14 — End: 2022-11-15
  Administered 2022-11-14: 1000 mL via INTRAVENOUS

## 2022-11-14 MED ORDER — ONDANSETRON HCL 4 MG/2ML IJ SOLN
4.0000 mg | Freq: Once | INTRAMUSCULAR | Status: AC
Start: 2022-11-14 — End: 2022-11-14
  Administered 2022-11-14: 4 mg via INTRAVENOUS
  Filled 2022-11-14: qty 2

## 2022-11-14 NOTE — ED Triage Notes (Signed)
Pt DX w/ covid 8/8, c/o fever, chills, Lt side mandibular lymph node is painful to touch and swollen, tireness, weakness. Denies any swallowing difficulty. Every day smoker.

## 2022-11-14 NOTE — Discharge Instructions (Signed)
You are seen today for flulike symptoms in the setting of a positive COVID test.  Your temperature and heart rate were elevated.  Please continue to take Motrin 6 00 mg with meals.  Take Tylenol 650 mg every 8 hours.  Take Tessalon for cough.  Begin Paxlovid and take as prescribed until gone.  Drink plenty of clear liquids and get plenty of rest      Know how it spreads    The best way to prevent illness is to avoid being exposed to this virus.    The virus is thought to spread from person-to-person.    Wash your hands often  Wash your hands often with soap and water for at least 20 seconds especially after you have been in a public place, or after blowing your nose, coughing, or sneezing.  If soap and water are not available, use a hand sanitizer that contains at least 60% alcohol.   Avoid touching your eyes, nose, and mouth with unwashed hands.    Physical distancing and masks  Put distance between yourself and other people outside of your home.  Stay at least 6 feet (about 2 arms' length) from other people.  Cover your face and mouth with a mask or cloth face cover when around others or out in public.  Masks or face coverings should not be placed on young children under age 66, anyone who has trouble breathing, or is unconscious, incapacitated or otherwise unable to remove the mask without assistance.  The mask or face cover will both protect you from infection and protect other people in case you are infected.  Continue to maintain 6 feet distance from others when possible. A mask or face cover is not a substitute for social distancing.      Clean and disinfect  Clean AND disinfect frequently touched surfaces regularly. This includes tables, doorknobs, light switches, countertops, handles, desks, phones, keyboards, toilets, faucets, and sinks.     What to do if you become sick  If you are sick with COVID-19 or think you might have COVID-19, follow these steps to care for yourself and to help protect other people  in your home and your community:  Look for emergency warning signs* of COVID-19:   A high fever not controlled by acetaminophen (Tylenol)  Worsening shortness of breath that limits your usual activities  Worsening chest pain, pressure, or tightness  Blue, white, or gray colored lips  Cold arms, legs, hands, or feet with blue, white, or gray skin  Severe weakness and fatigue  New confusion or sleepiness  Sudden numbness, weakness, or loss of balance  Difficulty speaking, vision problems, or severe headache  Unable to maintain SpO2 >92% on a pulse oximeter  This list is not all inclusive; please consult your medical provider for any other severe symptoms that are concerning to you.  Call 911 or call ahead to your local emergency facility: Notify the operator that you are seeking care for someone who has or may have COVID-19.  Stay home.  Do not leave your home, except to get medical care. If you have a medical appointment, call ahead to your doctor's office, and tell them you have or may have COVID-19. This will help the office protect themselves and other patients. Avoid public transportation, ridesharing, or taxis.    As much as possible, stay in a specific room and away from other people and pets in your home. If possible, you should use a separate bathroom.   If  a caregiver/other person needs to clean and disinfect a sick person's bedroom or bathroom, they should wear a mask and disposable gloves prior to cleaning. They should wait as long as possible after the person who is sick has used the bathroom before coming in to clean and use the bathroom. Hands should be washed after gloves are removed.  Do not share dishes, drinking glasses, cups, eating utensils, towels, or bedding with other people in your home.  Wash items thoroughly after using them with soap and water or put in the dishwasher.      https://www.hensley.biz/.html  DiscoHelp.si.html     Test Results and Questions  You can receive your COVID-19 test result online through MyChart, our secure patient portal.  If you have been tested for COVID-19 and the result is still pending, please be patient, since COVID-19 test results may take several days to be processed.  If you do not already have an account, a MyChart activation code should have been provided during your visit.   You should visit [email or web address] and follow the instructions to create an account, even if you do not have an activation code.  If you're having difficulty finding your COVID-19 test results through MyChart, or if you have additional questions, please contact your primary care provider or the medical team indicated on your After Visit Summary.     If you had a positive COVID-19 test here is a link to take you to a fact sheet.  DotProtection.gl

## 2022-11-15 ENCOUNTER — Emergency Department: Payer: Medicaid Other

## 2022-11-15 LAB — LACTIC ACID: Lactic Acid: 0.6 mmol/L (ref 0.2–2.0)

## 2022-11-15 LAB — PT AND APTT
INR: 1.3 (ref 0.9–1.1)
PT: 14.7 s — ABNORMAL HIGH (ref 10.1–12.9)
PTT: 26 s — ABNORMAL LOW (ref 27–39)

## 2022-11-15 LAB — URINALYSIS WITH REFLEX TO MICROSCOPIC EXAM - REFLEX TO CULTURE
Urine Bilirubin: NEGATIVE
Urine Blood: NEGATIVE
Urine Glucose: NEGATIVE
Urine Ketones: NEGATIVE mg/dL
Urine Leukocyte Esterase: NEGATIVE
Urine Nitrite: NEGATIVE
Urine Specific Gravity: 1.035 (ref 1.001–1.035)
Urine Urobilinogen: NORMAL mg/dL (ref 0.2–2.0)
Urine pH: 6.5 (ref 5.0–8.0)

## 2022-11-15 LAB — LAB USE ONLY - URINE GRAY CULTURE HOLD TUBE

## 2022-11-15 LAB — MAGNESIUM: Magnesium: 1.7 mg/dL (ref 1.6–2.6)

## 2022-11-15 LAB — LIPASE: Lipase: 15 U/L (ref 8–78)

## 2022-11-15 MED ORDER — ACETAMINOPHEN 325 MG PO TABS
325.0000 mg | ORAL_TABLET | Freq: Once | ORAL | Status: AC
Start: 2022-11-15 — End: 2022-11-15
  Administered 2022-11-15: 325 mg via ORAL
  Filled 2022-11-15: qty 1

## 2022-11-15 MED ORDER — SODIUM CHLORIDE 0.9 % IV BOLUS
1000.0000 mL | Freq: Once | INTRAVENOUS | Status: AC
Start: 2022-11-15 — End: 2022-11-15
  Administered 2022-11-15: 1000 mL via INTRAVENOUS

## 2022-11-15 MED ORDER — BUTALBITAL-APAP-CAFFEINE 50-325-40 MG PO TABS
1.0000 | ORAL_TABLET | ORAL | 0 refills | Status: DC | PRN
Start: 2022-11-15 — End: 2024-02-28

## 2022-11-15 MED ORDER — VANCOMYCIN HCL 1 G IV SOLR
2000.0000 mg | Freq: Once | INTRAVENOUS | Status: DC
Start: 2022-11-15 — End: 2022-11-15

## 2022-11-15 MED ORDER — IOHEXOL 350 MG/ML IV SOLN
100.0000 mL | Freq: Once | INTRAVENOUS | Status: AC | PRN
Start: 2022-11-15 — End: 2022-11-15
  Administered 2022-11-15: 100 mL via INTRAVENOUS

## 2022-11-15 MED ORDER — SODIUM CHLORIDE 0.9 % IV SOLN
2.0000 g | Freq: Once | INTRAVENOUS | Status: AC
Start: 2022-11-15 — End: 2022-11-15
  Administered 2022-11-15: 2 g via INTRAVENOUS
  Filled 2022-11-15: qty 2

## 2022-11-15 MED ORDER — ONDANSETRON 4 MG PO TBDP
4.0000 mg | ORAL_TABLET | Freq: Four times a day (QID) | ORAL | 0 refills | Status: DC | PRN
Start: 2022-11-15 — End: 2023-01-04

## 2022-11-15 MED ORDER — KETOROLAC TROMETHAMINE 30 MG/ML IJ SOLN
30.0000 mg | Freq: Once | INTRAMUSCULAR | Status: AC
Start: 2022-11-15 — End: 2022-11-15
  Administered 2022-11-15: 30 mg via INTRAVENOUS
  Filled 2022-11-15: qty 1

## 2022-11-15 MED ORDER — CLINDAMYCIN HCL 300 MG PO CAPS
300.0000 mg | ORAL_CAPSULE | Freq: Three times a day (TID) | ORAL | 0 refills | Status: AC
Start: 2022-11-15 — End: 2022-11-22

## 2022-11-15 NOTE — ED to IP RN Note (Signed)
[facility] - ED [facility]  ED NURSING NOTE FOR THE RECEIVING INPATIENT NURSE   ED NURSE Marissa Nestle [phone]   ED CHARGE RN Josh   ADMISSION INFORMATION   Catherine Spence is a 32 y.o. female admitted with an ED diagnosis of:    1. Flu-like symptoms    2. Acute COVID-19    3. Fever, unspecified fever cause         Isolation: None   Allergies: Amoxicillin, Ceclor [cefaclor], Latex, Macrodantin, Penicillins, Zyrtec d [cetirizine-pseudoephedrine er], Chocolate, Codeine, Flagyl [metronidazole], Hydrocodone, Oxycodone, Sulfa antibiotics, Sulfites, Valtrex [valacyclovir], and Wine [alcohol]   Holding Orders confirmed? N/A   Belongings Documented? N/A   Home medications sent to pharmacy confirmed? N/A   NURSING CARE   Patient Comes From:   Mental Status: Home Independent  alert and oriented   ADL: Independent with all ADLs   Ambulation: no difficulty   Pertinent Information  and Safety Concerns:     Broset Violence Risk Level: Low please call RN     CT / NIH   CT Head ordered on this patient?  N/A   NIH/Dysphagia assessment done prior to admission? N/A   VITAL SIGNS (at the time of this note)      Vitals:    11/15/22 0200   BP: 129/69   Pulse: 95   Resp: 17   Temp:    SpO2: 96%     Pain Score: 3-mild pain (11/15/22 0152)
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
## 2022-11-19 LAB — CULTURE BLOOD AEROBIC AND ANAEROBIC: Culture Blood: NO GROWTH

## 2023-01-04 ENCOUNTER — Emergency Department
Admission: EM | Admit: 2023-01-04 | Discharge: 2023-01-04 | Disposition: A | Discharge status: Home / Self Care | Payer: Medicaid Other | Attending: Emergency Medicine | Admitting: Emergency Medicine

## 2023-01-04 ENCOUNTER — Emergency Department: Payer: Medicaid Other

## 2023-01-04 DIAGNOSIS — R110 Nausea: Secondary | ICD-10-CM | POA: Insufficient documentation

## 2023-01-04 DIAGNOSIS — J329 Chronic sinusitis, unspecified: Secondary | ICD-10-CM | POA: Insufficient documentation

## 2023-01-04 DIAGNOSIS — H8112 Benign paroxysmal vertigo, left ear: Secondary | ICD-10-CM | POA: Insufficient documentation

## 2023-01-04 DIAGNOSIS — H8110 Benign paroxysmal vertigo, unspecified ear: Secondary | ICD-10-CM

## 2023-01-04 LAB — BASIC METABOLIC PANEL
Anion Gap: 11 (ref 5.0–15.0)
BUN: 16 mg/dL (ref 7–21)
CO2: 26 meq/L (ref 17–29)
Calcium: 9.3 mg/dL (ref 8.5–10.5)
Chloride: 104 meq/L (ref 99–111)
Creatinine: 0.9 mg/dL (ref 0.4–1.0)
GFR: >60 mL/min/{1.73_m2} (ref >=60.0)
Glucose: 110 mg/dL — ABNORMAL HIGH (ref 70–100)
Potassium: 4.4 meq/L (ref 3.5–5.3)
Sodium: 141 meq/L (ref 135–145)

## 2023-01-04 LAB — LAB USE ONLY - CBC WITH DIFFERENTIAL
Absolute Basophils: 0.02 10*3/uL (ref 0.00–0.08)
Absolute Eosinophils: 0.03 10*3/uL (ref 0.00–0.44)
Absolute Immature Granulocytes: 0.02 10*3/uL (ref 0.00–0.07)
Absolute Lymphocytes: 1.78 10*3/uL (ref 0.42–3.22)
Absolute Monocytes: 0.35 10*3/uL (ref 0.21–0.85)
Absolute Neutrophils: 7.13 10*3/uL — ABNORMAL HIGH (ref 1.10–6.33)
Absolute nRBC: 0 10*3/uL (ref <=0.00)
Basophils %: 0.2 %
Eosinophils %: 0.3 %
Hematocrit: 39.1 % (ref 34.7–43.7)
Hemoglobin: 12.5 g/dL (ref 11.4–14.8)
Immature Granulocytes %: 0.2 %
Lymphocytes %: 19.1 %
MCH: 24.7 pg — ABNORMAL LOW (ref 25.1–33.5)
MCHC: 32 g/dL (ref 31.5–35.8)
MCV: 77.1 fL — ABNORMAL LOW (ref 78.0–96.0)
MPV: 9.9 fL (ref 8.9–12.5)
Monocytes %: 3.8 %
Neutrophils %: 76.4 %
Platelet Count: 298 10*3/uL (ref 142–346)
Preliminary Absolute Neutrophil Count: 7.13 10*3/uL — ABNORMAL HIGH (ref 1.10–6.33)
RBC: 5.07 10*6/uL (ref 3.90–5.10)
RDW: 15 % (ref 11–15)
WBC: 9.33 10*3/uL (ref 3.10–9.50)
nRBC %: 0 /100{WBCs} (ref <=0.0)

## 2023-01-04 LAB — COVID-19 (SARS-COV-2) & INFLUENZA  A/B, NAA (ROCHE LIAT)
Influenza A RNA: NOT DETECTED
Influenza B RNA: NOT DETECTED
SARS-CoV-2 (COVID-19) RNA: NOT DETECTED

## 2023-01-04 LAB — SERUM HCG, QUALITATIVE: hCG Qualitative: NEGATIVE

## 2023-01-04 MED ORDER — MECLIZINE HCL 12.5 MG PO TABS
25.0000 mg | ORAL_TABLET | Freq: Once | ORAL | Status: AC
Start: 2023-01-04 — End: 2023-01-04
  Administered 2023-01-04: 25 mg via ORAL
  Filled 2023-01-04: qty 2

## 2023-01-04 MED ORDER — ONDANSETRON HCL 4 MG/2ML IJ SOLN
4.0000 mg | Freq: Once | INTRAMUSCULAR | Status: AC
Start: 2023-01-04 — End: 2023-01-04
  Administered 2023-01-04: 4 mg via INTRAVENOUS
  Filled 2023-01-04: qty 2

## 2023-01-04 MED ORDER — SODIUM CHLORIDE 0.9 % IV BOLUS
500.0000 mL | Freq: Once | INTRAVENOUS | Status: AC
Start: 2023-01-04 — End: 2023-01-04
  Administered 2023-01-04: 500 mL via INTRAVENOUS

## 2023-01-04 MED ORDER — ONDANSETRON 4 MG PO TBDP
4.0000 mg | ORAL_TABLET | Freq: Four times a day (QID) | ORAL | 0 refills | Status: DC | PRN
Start: 2023-01-04 — End: 2024-02-28

## 2023-01-04 MED ORDER — DOXYCYCLINE MONOHYDRATE 100 MG PO CAPS
100.0000 mg | ORAL_CAPSULE | Freq: Once | ORAL | Status: AC
Start: 2023-01-04 — End: 2023-01-04
  Administered 2023-01-04: 100 mg via ORAL
  Filled 2023-01-04: qty 1

## 2023-01-04 MED ORDER — KETOROLAC TROMETHAMINE 30 MG/ML IJ SOLN
30.0000 mg | Freq: Once | INTRAMUSCULAR | Status: AC
Start: 2023-01-04 — End: 2023-01-04
  Administered 2023-01-04: 30 mg via INTRAVENOUS
  Filled 2023-01-04: qty 1

## 2023-01-04 MED ORDER — DOXYCYCLINE HYCLATE 100 MG PO TABS
100.0000 mg | ORAL_TABLET | Freq: Two times a day (BID) | ORAL | 0 refills | Status: AC
Start: 2023-01-04 — End: 2023-01-11

## 2023-01-04 NOTE — ED Triage Notes (Signed)
Pt reports chronic sinusitis. Headache, vomiting and dizziness with eye movement for 4 days

## 2023-01-04 NOTE — Discharge Instructions (Addendum)
Sinusitis    You are seen today for frontal headache with nasal drainage, dizziness, nausea.  Your CAT scan and blood work are reassuring.  Please continue to take Motrin 600 mg with meals, take Tylenol 650 mg every 8 hours.  Take antibiotic doxycycline as prescribed until gone.  Please take Zofran as needed for nausea.  Use over-the-counter nasal spray Flonase, continue over-the-counter Sudafed.    Call the ENT listed below with any continued symptoms     You have been seen for a sinus infection.     Sinus infections are common. They often happen after people have a virus or a common cold.     Symptoms are: Pain in the face, green or yellow mucous from the nose, fever (temperature higher than 100.30F / 38C) and chills. There may also be a feeling of fullness or pressure in the face.     Decongestants may be used to help the sinuses drain. Sometimes a steroid spray is used.     You may need antibiotics for the infection. Not all cases of sinusitis need antibiotics. Viruses cause most sinusitis. Antibiotics do not work on viruses. Sometimes sinusitis and be caused by bacteria. These cases usually get better with medicine to help the sinuses drain. Antibiotics should be given only to patients who have symptoms for more than 2 weeks and if they have fever, severe sinus pain and pus mixed in with the mucus.     YOU SHOULD SEEK MEDICAL ATTENTION IMMEDIATELY, EITHER HERE OR AT THE NEAREST EMERGENCY DEPARTMENT, IF ANY OF THE FOLLOWING OCCURS:  You do not get better with treatment.  You have severe headaches and high fever (temperature higher than 100.30F / 38C) or any confusion.  You have worse pain in the face. Your face gets more swollen.
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
## 2023-03-26 ENCOUNTER — Emergency Department
Admission: EM | Admit: 2023-03-26 | Discharge: 2023-03-26 | Disposition: A | Discharge status: Home / Self Care | Payer: Medicaid Other | Attending: Emergency Medicine | Admitting: Emergency Medicine

## 2023-03-26 DIAGNOSIS — N939 Abnormal uterine and vaginal bleeding, unspecified: Secondary | ICD-10-CM | POA: Insufficient documentation

## 2023-03-26 DIAGNOSIS — F17210 Nicotine dependence, cigarettes, uncomplicated: Secondary | ICD-10-CM | POA: Insufficient documentation

## 2023-03-26 LAB — LAB USE ONLY - CBC WITH DIFFERENTIAL
Absolute Basophils: 0.02 10*3/uL (ref 0.00–0.08)
Absolute Eosinophils: 0.18 10*3/uL (ref 0.00–0.44)
Absolute Immature Granulocytes: 0.02 10*3/uL (ref 0.00–0.07)
Absolute Lymphocytes: 2.37 10*3/uL (ref 0.42–3.22)
Absolute Monocytes: 0.44 10*3/uL (ref 0.21–0.85)
Absolute Neutrophils: 5.6 10*3/uL (ref 1.10–6.33)
Absolute nRBC: 0 10*3/uL (ref <=0.00)
Basophils %: 0.2 %
Eosinophils %: 2.1 %
Hematocrit: 37.3 % (ref 34.7–43.7)
Hemoglobin: 11.9 g/dL (ref 11.4–14.8)
Immature Granulocytes %: 0.2 %
Lymphocytes %: 27.5 %
MCH: 24.7 pg — ABNORMAL LOW (ref 25.1–33.5)
MCHC: 31.9 g/dL (ref 31.5–35.8)
MCV: 77.5 fL — ABNORMAL LOW (ref 78.0–96.0)
MPV: 9.9 fL (ref 8.9–12.5)
Monocytes %: 5.1 %
Neutrophils %: 64.9 %
Platelet Count: 291 10*3/uL (ref 142–346)
Preliminary Absolute Neutrophil Count: 5.6 10*3/uL (ref 1.10–6.33)
RBC: 4.81 10*6/uL (ref 3.90–5.10)
RDW: 15 % (ref 11–15)
WBC: 8.63 10*3/uL (ref 3.10–9.50)
nRBC %: 0 /100{WBCs} (ref <=0.0)

## 2023-03-26 LAB — COMPREHENSIVE METABOLIC PANEL
ALT: 29 U/L (ref <=55)
AST (SGOT): 23 U/L (ref <=41)
Albumin/Globulin Ratio: 1.3 (ref 0.9–2.2)
Albumin: 3.5 g/dL (ref 3.5–5.0)
Alkaline Phosphatase: 60 U/L (ref 37–117)
Anion Gap: 6 (ref 5.0–15.0)
BUN: 13 mg/dL (ref 7–21)
Bilirubin, Total: 0.3 mg/dL (ref 0.2–1.2)
CO2: 24 meq/L (ref 17–29)
Calcium: 8.3 mg/dL — ABNORMAL LOW (ref 8.5–10.5)
Chloride: 111 meq/L (ref 99–111)
Creatinine: 0.7 mg/dL (ref 0.4–1.0)
GFR: >60 mL/min/{1.73_m2} (ref >=60.0)
Globulin: 2.8 g/dL (ref 2.0–3.6)
Glucose: 97 mg/dL (ref 70–100)
Potassium: 4.4 meq/L (ref 3.5–5.3)
Protein, Total: 6.3 g/dL (ref 6.0–8.3)
Sodium: 141 meq/L (ref 135–145)

## 2023-03-26 LAB — TYPE AND SCREEN
ABO Rh: O POS
Antibody Screen: NEGATIVE

## 2023-03-26 LAB — BETA HCG QUANTITATIVE, PREGNANCY: hCG, Quantitative: 2.8 m[IU]/mL

## 2023-03-26 NOTE — ED Triage Notes (Signed)
 Ambulatory to triage with vaginal bleeding and abdominal cramping since this morning at 0500. States it feels like a period but she found out last week she was 3-[redacted] weeks pregnant. [id number].

## 2023-03-26 NOTE — ED Provider Notes (Signed)
 History     Chief Complaint   Patient presents with    Vaginal Bleeding-pregnant     32 year old female presents to the emergency department with vaginal bleeding and some lower abdominal cramping that began at 5 AM today patient is gravida 6 para 4

## 2023-03-26 NOTE — Discharge Instructions (Signed)
 I recommend that you follow-up with your OB/GYN next week to have a repeat beta and determine the etiology behind having a positive test previously

## 2023-04-05 NOTE — L&D Delivery Note (Signed)
 Vaginal Delivery Note    [facility] [place]  [facility]    Patient Name: NATILEE, GAUER    Room#: W7791/W7791.A    Procedure:   Spontaneous Vaginal Delivery following IOL for PFT 50%    Brief Notes:   [id number] [redacted]w[redacted]d weeks gestation Normal Spontaneous Vaginal Delivery (NSVD) of viable female infant.    Birth Date: 02/27/2024  Delivery Time: 12:16 PM     Delivery Summary:     Rupture: 9063 02/27/24  Fluid color: Clear    Normal spontaneous vaginal birth of viable female infant Goddess. Head delivered spontaneously. No nuchal cord present. Shoulders and body birthed without difficulty. Baby to maternal abdomen. Cord clamped x2 and cut after 45 seconds by FOB. Baby taken to the warmer for evaluation d/t respiratory effort. Placenta delivered spontaneously. Inspected, complete and intact with cord with normal cord insertion. FFU/U with massage.  Pitocin  30 units in 500 cc infusing for good uterine response.  Perineum inspected and perineum intact. Repaired under epidural with 3-0 Vicryl in the usual fashion. Mom and baby stable and bonding.  Count complete and correct.    APGAR:   1 Minute Apgar:     6  5 Minute Apgar:     8      Birth Weight:     5 lb 9.2 oz (2530 g)    Placenta Delivery:     02/27/2024 12:23 PM      Anesthesia:   Epidural    EBL:    50 cc         Additional Notes:   Patient was GBS Negative    Available physician: Gray Munroe, MD      Providers Performing:   Kelly Dais, CNM  Lucie Holland SNM    Signed by: Kelly Dais, CNM

## 2023-06-17 ENCOUNTER — Emergency Department
Admission: EM | Admit: 2023-06-17 | Discharge: 2023-06-17 | Disposition: A | Discharge status: Home / Self Care | Attending: Emergency Medicine | Admitting: Emergency Medicine

## 2023-06-17 DIAGNOSIS — Z6841 Body Mass Index (BMI) 40.0 and over, adult: Secondary | ICD-10-CM | POA: Insufficient documentation

## 2023-06-17 DIAGNOSIS — K5900 Constipation, unspecified: Secondary | ICD-10-CM | POA: Insufficient documentation

## 2023-06-17 DIAGNOSIS — K602 Anal fissure, unspecified: Secondary | ICD-10-CM | POA: Insufficient documentation

## 2023-06-17 DIAGNOSIS — K6289 Other specified diseases of anus and rectum: Secondary | ICD-10-CM

## 2023-06-17 DIAGNOSIS — K649 Unspecified hemorrhoids: Secondary | ICD-10-CM

## 2023-06-17 DIAGNOSIS — R109 Unspecified abdominal pain: Secondary | ICD-10-CM | POA: Insufficient documentation

## 2023-06-17 DIAGNOSIS — K644 Residual hemorrhoidal skin tags: Secondary | ICD-10-CM | POA: Insufficient documentation

## 2023-06-17 MED ORDER — PRAMOXINE HCL (PERIANAL) 1 % EX FOAM
CUTANEOUS | 0 refills | Status: AC | PRN
Start: 2023-06-17 — End: ?

## 2023-06-17 MED ORDER — PEG 3350-KCL-NABCB-NACL-NASULF 236 G PO SOLR
4.0000 L | Freq: Once | ORAL | 0 refills | Status: AC
Start: 2023-06-17 — End: 2023-06-17

## 2023-06-17 MED ORDER — DOCUSATE SODIUM 100 MG PO CAPS
100.0000 mg | ORAL_CAPSULE | Freq: Two times a day (BID) | ORAL | 0 refills | Status: AC
Start: 2023-06-17 — End: 2023-06-24
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
## 2023-08-03 ENCOUNTER — Ambulatory Visit (INDEPENDENT_AMBULATORY_CARE_PROVIDER_SITE_OTHER): Payer: Medicaid Other | Admitting: Family

## 2023-08-03 ENCOUNTER — Emergency Department
Admission: EM | Admit: 2023-08-03 | Discharge: 2023-08-03 | Disposition: A | Discharge status: Home / Self Care | Attending: Emergency Medicine | Admitting: Emergency Medicine

## 2023-08-03 ENCOUNTER — Emergency Department

## 2023-08-03 DIAGNOSIS — R42 Dizziness and giddiness: Secondary | ICD-10-CM | POA: Insufficient documentation

## 2023-08-03 DIAGNOSIS — Z6841 Body Mass Index (BMI) 40.0 and over, adult: Secondary | ICD-10-CM | POA: Insufficient documentation

## 2023-08-03 DIAGNOSIS — O9A211 Injury, poisoning and certain other consequences of external causes complicating pregnancy, first trimester: Secondary | ICD-10-CM | POA: Insufficient documentation

## 2023-08-03 DIAGNOSIS — R7989 Other specified abnormal findings of blood chemistry: Secondary | ICD-10-CM | POA: Insufficient documentation

## 2023-08-03 DIAGNOSIS — O99891 Other specified diseases and conditions complicating pregnancy: Secondary | ICD-10-CM | POA: Insufficient documentation

## 2023-08-03 DIAGNOSIS — M545 Low back pain, unspecified: Secondary | ICD-10-CM | POA: Insufficient documentation

## 2023-08-03 DIAGNOSIS — S39012A Strain of muscle, fascia and tendon of lower back, initial encounter: Secondary | ICD-10-CM | POA: Insufficient documentation

## 2023-08-03 DIAGNOSIS — R9389 Abnormal findings on diagnostic imaging of other specified body structures: Secondary | ICD-10-CM

## 2023-08-03 DIAGNOSIS — X58XXXA Exposure to other specified factors, initial encounter: Secondary | ICD-10-CM | POA: Insufficient documentation

## 2023-08-03 DIAGNOSIS — O283 Abnormal ultrasonic finding on antenatal screening of mother: Secondary | ICD-10-CM | POA: Insufficient documentation

## 2023-08-03 DIAGNOSIS — N83209 Unspecified ovarian cyst, unspecified side: Secondary | ICD-10-CM

## 2023-08-03 DIAGNOSIS — O3481 Maternal care for other abnormalities of pelvic organs, first trimester: Secondary | ICD-10-CM | POA: Insufficient documentation

## 2023-08-03 DIAGNOSIS — O219 Vomiting of pregnancy, unspecified: Secondary | ICD-10-CM | POA: Insufficient documentation

## 2023-08-03 DIAGNOSIS — Z3A01 Less than 8 weeks gestation of pregnancy: Secondary | ICD-10-CM | POA: Insufficient documentation

## 2023-08-03 DIAGNOSIS — R079 Chest pain, unspecified: Secondary | ICD-10-CM

## 2023-08-03 DIAGNOSIS — R112 Nausea with vomiting, unspecified: Secondary | ICD-10-CM

## 2023-08-03 DIAGNOSIS — N83291 Other ovarian cyst, right side: Secondary | ICD-10-CM | POA: Insufficient documentation

## 2023-08-03 DIAGNOSIS — R899 Unspecified abnormal finding in specimens from other organs, systems and tissues: Secondary | ICD-10-CM

## 2023-08-03 LAB — LAB USE ONLY - CBC WITH DIFFERENTIAL
Absolute Basophils: 0.03 10*3/uL (ref 0.00–0.08)
Absolute Eosinophils: 0.16 10*3/uL (ref 0.00–0.44)
Absolute Immature Granulocytes: 0.03 10*3/uL (ref 0.00–0.07)
Absolute Lymphocytes: 2.99 10*3/uL (ref 0.42–3.22)
Absolute Monocytes: 0.46 10*3/uL (ref 0.21–0.85)
Absolute Neutrophils: 5.68 10*3/uL (ref 1.10–6.33)
Absolute nRBC: 0 10*3/uL (ref <=0.00)
Basophils %: 0.3 %
Eosinophils %: 1.7 %
Hematocrit: 35.8 % (ref 34.7–43.7)
Hemoglobin: 11.7 g/dL (ref 11.4–14.8)
Immature Granulocytes %: 0.3 %
Lymphocytes %: 32 %
MCH: 25 pg — ABNORMAL LOW (ref 25.1–33.5)
MCHC: 32.7 g/dL (ref 31.5–35.8)
MCV: 76.5 fL — ABNORMAL LOW (ref 78.0–96.0)
MPV: 9.1 fL (ref 8.9–12.5)
Monocytes %: 4.9 %
Neutrophils %: 60.8 %
Platelet Count: 300 10*3/uL (ref 142–346)
Preliminary Absolute Neutrophil Count: 5.68 10*3/uL (ref 1.10–6.33)
RBC: 4.68 10*6/uL (ref 3.90–5.10)
RDW: 15 % (ref 11–15)
WBC: 9.35 10*3/uL (ref 3.10–9.50)
nRBC %: 0 /100{WBCs} (ref <=0.0)

## 2023-08-03 LAB — URINALYSIS WITH REFLEX TO MICROSCOPIC EXAM - REFLEX TO CULTURE
Urine Bilirubin: NEGATIVE
Urine Blood: NEGATIVE
Urine Glucose: NEGATIVE
Urine Ketones: NEGATIVE mg/dL
Urine Leukocyte Esterase: NEGATIVE
Urine Nitrite: NEGATIVE
Urine Protein: NEGATIVE
Urine Specific Gravity: 1.025 (ref 1.001–1.030)
Urine Urobilinogen: 0.2 mg/dL (ref 0.2–2.0)
Urine pH: 6 (ref 5.0–8.0)

## 2023-08-03 LAB — LAB USE ONLY - URINE GRAY CULTURE HOLD TUBE

## 2023-08-03 LAB — COMPREHENSIVE METABOLIC PANEL
ALT: 31 U/L (ref <=55)
AST (SGOT): 20 U/L (ref <=41)
Albumin/Globulin Ratio: 1.2 (ref 0.9–2.2)
Albumin: 3.6 g/dL (ref 3.5–5.0)
Alkaline Phosphatase: 47 U/L (ref 37–117)
Anion Gap: 10 (ref 5.0–15.0)
BUN: 8 mg/dL (ref 7–21)
Bilirubin, Total: 0.3 mg/dL (ref 0.2–1.2)
CO2: 20 meq/L (ref 17–29)
Calcium: 8.4 mg/dL — ABNORMAL LOW (ref 8.5–10.5)
Chloride: 111 meq/L (ref 99–111)
Creatinine: 0.6 mg/dL (ref 0.4–1.0)
GFR: >60 mL/min/{1.73_m2} (ref >=60.0)
Globulin: 2.9 g/dL (ref 2.0–3.6)
Glucose: 106 mg/dL — ABNORMAL HIGH (ref 70–100)
Potassium: 3.7 meq/L (ref 3.5–5.3)
Protein, Total: 6.5 g/dL (ref 6.0–8.3)
Sodium: 141 meq/L (ref 135–145)

## 2023-08-03 LAB — LIPASE: Lipase: 27 U/L (ref 8–78)

## 2023-08-03 LAB — ECG 12-LEAD
Atrial Rate: 79 {beats}/min
IHS MUSE NARRATIVE AND IMPRESSION: NORMAL
P Axis: 25 degrees
P-R Interval: 182 ms
Q-T Interval: 404 ms
QRS Duration: 92 ms
QTC Calculation (Bezet): 463 ms
R Axis: 2 degrees
T Axis: 11 degrees
Ventricular Rate: 79 {beats}/min

## 2023-08-03 LAB — COVID-19 (SARS-COV-2) & INFLUENZA  A/B, NAA (ROCHE LIAT)
Influenza A RNA: NOT DETECTED
Influenza B RNA: NOT DETECTED
SARS-CoV-2 (COVID-19) RNA: NOT DETECTED

## 2023-08-03 LAB — BETA HCG QUANTITATIVE, PREGNANCY: hCG, Quantitative: 17848.2 m[IU]/mL

## 2023-08-03 LAB — HIGH SENSITIVITY TROPONIN-I: hs Troponin: <2.7 ng/L (ref <=14.0)

## 2023-08-03 LAB — SERUM HCG, QUALITATIVE: hCG Qualitative: POSITIVE — AB

## 2023-08-03 MED ORDER — METOCLOPRAMIDE HCL 5 MG/ML IJ SOLN
10.0000 mg | Freq: Once | INTRAMUSCULAR | Status: AC
Start: 2023-08-03 — End: 2023-08-03
  Administered 2023-08-03: 10 mg via INTRAVENOUS
  Filled 2023-08-03: qty 2

## 2023-08-03 MED ORDER — LIDOCAINE 5 % EX PTCH
1.0000 | MEDICATED_PATCH | CUTANEOUS | 0 refills | Status: AC
Start: 2023-08-03 — End: ?

## 2023-08-03 MED ORDER — LIDOCAINE 5 % EX PTCH
1.0000 | MEDICATED_PATCH | Freq: Once | CUTANEOUS | Status: DC
Start: 2023-08-03 — End: 2023-08-03
  Administered 2023-08-03: 1 via TRANSDERMAL
  Filled 2023-08-03: qty 1

## 2023-08-03 MED ORDER — METOCLOPRAMIDE HCL 10 MG PO TABS: 10.0000 mg | ORAL_TABLET | Freq: Four times a day (QID) | ORAL | 0 refills | Status: DC | PRN

## 2023-08-03 MED ORDER — SODIUM CHLORIDE 0.9 % IV BOLUS
1000.0000 mL | Freq: Once | INTRAVENOUS | Status: AC
Start: 2023-08-03 — End: 2023-08-03
  Administered 2023-08-03: 1000 mL via INTRAVENOUS
  Filled 2023-08-03: qty 1000

## 2023-08-03 MED ORDER — CYCLOBENZAPRINE HCL 10 MG PO TABS
10.0000 mg | ORAL_TABLET | Freq: Three times a day (TID) | ORAL | 0 refills | Status: DC | PRN
Start: 2023-08-03 — End: 2024-02-28

## 2023-08-03 NOTE — ED Triage Notes (Addendum)
 Symptoms started several weeks ago. PT is unsure of her pregnancy status. PT states came in today due to dizziness. Heat makes symptoms worse.
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
## 2023-08-03 NOTE — EDIE (Signed)
 [facility] NOTIFICATION 08/03/2023 06:51 Catherine Spence, Catherine Spence DOB: Aug 04, 1990 MRN: [id number]    Criteria Met      5 ED Visits in 12 Months    Security and Safety  No Security Events were found.  ED Care Guidelines  There are currently no ED Care Guidelines for this patient. Please check your facility's medical records system.        Prescription Monitoring Program  Narx Score not available at this time.    E.D. Visit Count (12 mo.)  Facility Visits   [facility]: [facility]) 4   [place] [facility] 1   Total 5   Note: Visits indicate total known visits.     Recent Emergency Department Visit Summary  Date Facility [facility] Type Diagnoses or Chief Complaint    Aug 03, 2023  [place] [facility]: [facility]  [place].  [place]  Emergency      Nausea; Emesis; Dizziness; Lower Back Pain      Nausea; Emesis; Dizziness      Jun 17, 2023  [place] [facility]: [facility])  [place].  [place]  Emergency      Other specified diseases of anus and rectum      Unspecified abdominal pain      Body mass index [BMI] 40.0-44.9, adult      Unspecified hemorrhoids      Constipation, unspecified      Anal fissure, unspecified      rectal bleeding      Mar 26, 2023  [place] [facility] [place] [facility]  [place]  Emergency      Abnormal uterine and vaginal bleeding, unspecified      Vaginal Bleeding-Pregnant      Jan 04, 2023  [place] [facility]: [facility])  [place].  [place]  Emergency      Nausea      Benign paroxysmal vertigo, unspecified ear      Chronic sinusitis, unspecified      sinus pressure      Headache      Dizziness      Emesis      Emesis and dizziness.      Nov 14, 2022  [place] [facility]: [facility])  [place].  [place]  Emergency      Systemic inflammatory response syndrome (SIRS) of non-infectious origin without acute organ dysfunction      Localized enlarged lymph nodes      Fever, unspecified      COVID-19      Other general symptoms and signs      Chills      Fever      COVID+; 104 fever; swollen lymph  nodes        Recent Inpatient Visit Summary  No Recent Inpatient Visits were found.  Care Team  Provider Specialty Phone Fax Service Dates   Shana Daring A., D.O. Pediatrics: Pediatric Critical Care Medicine [phone]  Current      [facility]  This patient has registered at the [place] [facility]: [facility]) Emergency Department  For more information visit: [email or web address]     PLEASE NOTE:     1.   Any care recommendations and other clinical information are provided as guidelines or for historical purposes only, and providers should exercise their own clinical judgment when providing care.    2.   You may only use this information for purposes of treatment, payment or health care operations activities, and subject to the limitations of applicable [facility] Policies.  3.   You should consult directly with the organization that provided a care guideline or other clinical history with any questions about additional information or accuracy or completeness of information provided.    ? 2025 [facility] - [email or web address]

## 2023-08-22 LAB — HEPATITIS C ANTIBODY, TOTAL: Hepatitis C, AB: NEGATIVE

## 2023-08-22 LAB — HIV-1/2, ANTIGEN AND ANTIBODY WITH REFLEX TO CONFIRMATION: HIV Ag/Ab, 4th Generation: NONREACTIVE

## 2023-08-22 LAB — HEPATITIS B (HBV) SURFACE ANTIGEN WITH REFLEX TO CONFIRMATION: Hepatitis B Surface Antigen: NEGATIVE

## 2023-08-22 LAB — RUBELLA ANTIBODY, IGG: Rubella AB, IgG: NON-IMMUNE/NOT IMMUNE

## 2023-09-03 ENCOUNTER — Emergency Department
Admission: EM | Admit: 2023-09-03 | Discharge: 2023-09-04 | Disposition: A | Discharge status: Home / Self Care | Attending: Emergency Medicine | Admitting: Emergency Medicine

## 2023-09-03 DIAGNOSIS — O99611 Diseases of the digestive system complicating pregnancy, first trimester: Secondary | ICD-10-CM | POA: Insufficient documentation

## 2023-09-03 DIAGNOSIS — O219 Vomiting of pregnancy, unspecified: Secondary | ICD-10-CM | POA: Insufficient documentation

## 2023-09-03 DIAGNOSIS — O99281 Endocrine, nutritional and metabolic diseases complicating pregnancy, first trimester: Secondary | ICD-10-CM | POA: Insufficient documentation

## 2023-09-03 DIAGNOSIS — O99891 Other specified diseases and conditions complicating pregnancy: Secondary | ICD-10-CM | POA: Insufficient documentation

## 2023-09-03 DIAGNOSIS — R079 Chest pain, unspecified: Secondary | ICD-10-CM

## 2023-09-03 DIAGNOSIS — O26891 Other specified pregnancy related conditions, first trimester: Secondary | ICD-10-CM | POA: Insufficient documentation

## 2023-09-03 DIAGNOSIS — R0981 Nasal congestion: Secondary | ICD-10-CM | POA: Insufficient documentation

## 2023-09-03 DIAGNOSIS — Z3A11 11 weeks gestation of pregnancy: Secondary | ICD-10-CM | POA: Insufficient documentation

## 2023-09-03 DIAGNOSIS — R112 Nausea with vomiting, unspecified: Secondary | ICD-10-CM

## 2023-09-03 DIAGNOSIS — Z20822 Contact with and (suspected) exposure to covid-19: Secondary | ICD-10-CM

## 2023-09-03 DIAGNOSIS — K5900 Constipation, unspecified: Secondary | ICD-10-CM | POA: Insufficient documentation

## 2023-09-03 DIAGNOSIS — R42 Dizziness and giddiness: Secondary | ICD-10-CM | POA: Insufficient documentation

## 2023-09-03 LAB — LAB USE ONLY - CBC WITH DIFFERENTIAL
Absolute Basophils: 0.02 x10 3/uL (ref 0.00–0.08)
Absolute Eosinophils: 0.11 x10 3/uL (ref 0.00–0.44)
Absolute Immature Granulocytes: 0.04 x10 3/uL (ref 0.00–0.07)
Absolute Lymphocytes: 2.93 x10 3/uL (ref 0.42–3.22)
Absolute Monocytes: 0.56 x10 3/uL (ref 0.21–0.85)
Absolute Neutrophils: 5.91 x10 3/uL (ref 1.10–6.33)
Absolute nRBC: 0 x10 3/uL (ref <=0.00)
Basophils %: 0.2 %
Eosinophils %: 1.1 %
Hematocrit: 37.6 % (ref 34.7–43.7)
Hemoglobin: 11.9 g/dL (ref 11.4–14.8)
Immature Granulocytes %: 0.4 %
Lymphocytes %: 30.6 %
MCH: 25.5 pg (ref 25.1–33.5)
MCHC: 31.6 g/dL (ref 31.5–35.8)
MCV: 80.7 fL (ref 78.0–96.0)
MPV: 9.3 fL (ref 8.9–12.5)
Monocytes %: 5.9 %
Neutrophils %: 61.8 %
Platelet Count: 263 x10 3/uL (ref 142–346)
Preliminary Absolute Neutrophil Count: 5.91 x10 3/uL (ref 1.10–6.33)
RBC: 4.66 x10 6/uL (ref 3.90–5.10)
RDW: 16 % — ABNORMAL HIGH (ref 11–15)
WBC: 9.57 x10 3/uL — ABNORMAL HIGH (ref 3.10–9.50)
nRBC %: 0 /100{WBCs} (ref <=0.0)

## 2023-09-03 LAB — COVID-19 (SARS-COV-2) & INFLUENZA  A/B, NAA (ROCHE LIAT)
Influenza A RNA: NOT DETECTED
Influenza B RNA: NOT DETECTED
SARS-CoV-2 (COVID-19) RNA: NOT DETECTED

## 2023-09-03 LAB — URINALYSIS WITH REFLEX TO MICROSCOPIC EXAM - REFLEX TO CULTURE
Urine Bilirubin: NEGATIVE
Urine Blood: NEGATIVE
Urine Glucose: NEGATIVE
Urine Ketones: NEGATIVE mg/dL
Urine Nitrite: NEGATIVE
Urine Specific Gravity: 1.033 (ref 1.001–1.035)
Urine Urobilinogen: NORMAL mg/dL (ref 0.2–2.0)
Urine pH: 5.5 (ref 5.0–8.0)

## 2023-09-03 LAB — COMPREHENSIVE METABOLIC PANEL
ALT: 12 U/L (ref <=55)
AST (SGOT): 14 U/L (ref <=41)
Albumin/Globulin Ratio: 1.2 (ref 0.9–2.2)
Albumin: 3.6 g/dL (ref 3.5–5.0)
Alkaline Phosphatase: 53 U/L (ref 37–117)
Anion Gap: 8 (ref 5.0–15.0)
BUN: 9 mg/dL (ref 7–21)
Bilirubin, Total: 0.2 mg/dL (ref 0.2–1.2)
CO2: 22 meq/L (ref 17–29)
Calcium: 8.7 mg/dL (ref 8.5–10.5)
Chloride: 107 meq/L (ref 99–111)
Creatinine: 0.6 mg/dL (ref 0.4–1.0)
GFR: >60 mL/min/1.73 m2 (ref >=60.0)
Globulin: 3.1 g/dL (ref 2.0–3.6)
Glucose: 95 mg/dL (ref 70–100)
Potassium: 3.9 meq/L (ref 3.5–5.3)
Protein, Total: 6.7 g/dL (ref 6.0–8.3)
Sodium: 137 meq/L (ref 135–145)

## 2023-09-03 LAB — LIPASE: Lipase: 21 U/L (ref 8–78)

## 2023-09-03 LAB — WHOLE BLOOD GLUCOSE POCT: Whole Blood Glucose POCT: 103 mg/dL — ABNORMAL HIGH (ref 70–100)

## 2023-09-03 LAB — HIGH SENSITIVITY TROPONIN-I: hs Troponin: <2.7 ng/L (ref <=14.0)

## 2023-09-03 LAB — BETA HCG QUANTITATIVE, PREGNANCY: hCG, Quantitative: 108212.9 m[IU]/mL

## 2023-09-03 LAB — MAGNESIUM: Magnesium: 1.8 mg/dL (ref 1.6–2.6)

## 2023-09-03 MED ORDER — DIPHENHYDRAMINE HCL 50 MG/ML IJ SOLN
25.0000 mg | Freq: Once | INTRAMUSCULAR | Status: AC
Start: 2023-09-03 — End: 2023-09-03
  Administered 2023-09-03: 25 mg via INTRAVENOUS
  Filled 2023-09-03: qty 1

## 2023-09-03 MED ORDER — MAGNESIUM SULFATE IN D5W 1-5 GM/100ML-% IV SOLN
1.0000 g | Freq: Once | INTRAVENOUS | Status: AC
Start: 2023-09-03 — End: 2023-09-04
  Administered 2023-09-03: 1 g via INTRAVENOUS
  Filled 2023-09-03: qty 100

## 2023-09-03 MED ORDER — SODIUM CHLORIDE 0.9 % IV BOLUS
1000.0000 mL | Freq: Once | INTRAVENOUS | Status: AC
Start: 2023-09-03 — End: 2023-09-03
  Administered 2023-09-03: 1000 mL via INTRAVENOUS
  Filled 2023-09-03: qty 1000

## 2023-09-03 MED ORDER — METOCLOPRAMIDE HCL 5 MG/ML IJ SOLN
10.0000 mg | Freq: Once | INTRAMUSCULAR | Status: AC
Start: 2023-09-03 — End: 2023-09-03
  Administered 2023-09-03: 10 mg via INTRAVENOUS
  Filled 2023-09-03: qty 2

## 2023-09-03 NOTE — EDIE (Signed)
 [facility] NOTIFICATION 09/03/2023 15:21 Catherine Spence, Catherine Spence DOB: Aug 04, 1990 MRN: [id number]    [facility]'s patient encounter information:   FMW:?[id number]  Account [id number]  Billing Account [id number]      Criteria Met      5 ED Visits in 12 Months    Security and Safety  No Security Events were found.  ED Care Guidelines  There are currently no ED Care Guidelines for this patient. Please check your facility's medical records system.        Prescription Monitoring Program  Narx Score not available at this time.    E.D. Visit Count (12 mo.)  Facility Visits   [place] [facility]: [facility]) 4   [facility] 2   Total 6   Note: Visits indicate total known visits.     Recent Emergency Department Visit Summary  Date Facility [facility] Type Diagnoses or Chief Complaint    Sep 03, 2023  [place] [facility] [place].  [place]  Emergency      medic      Aug 03, 2023  [place] [facility]: [facility])  [place].  [place]  Emergency      Abnormal findings on diagnostic imaging of other specified body structures      Unspecified abnormal finding in specimens from other organs, systems and tissues      Unspecified ovarian cyst, unspecified side      Dizziness and giddiness      Body mass index [BMI] 40.0-44.9, adult      Strain of muscle, fascia and tendon of lower back, initial encounter      Low back pain, unspecified      Less than [redacted] weeks gestation of pregnancy      Back Pain      Nausea with vomiting, unspecified      Jun 17, 2023  [place] [facility]: [facility])  [place].  [place]  Emergency      Other specified diseases of anus and rectum      Unspecified abdominal pain      Body mass index [BMI] 40.0-44.9, adult      Unspecified hemorrhoids      Constipation, unspecified      Anal fissure, unspecified      rectal bleeding      Mar 26, 2023  [place] [name]  [place]  Emergency      Abnormal uterine and vaginal bleeding, unspecified      Vaginal Bleeding-Pregnant       Jan 04, 2023  [place] [facility]: [facility])  [place].  [place]  Emergency      Nausea      Benign paroxysmal vertigo, unspecified ear      Chronic sinusitis, unspecified      sinus pressure      Headache      Dizziness      Emesis      Emesis and dizziness.      Nov 14, 2022  [place] [facility]: [facility])  [place].  [place]  Emergency      Systemic inflammatory response syndrome (SIRS) of non-infectious origin without acute organ dysfunction      Localized enlarged lymph nodes      Fever, unspecified      COVID-19      Other general symptoms and signs      Chills      Fever      COVID+; 104 fever; swollen lymph  nodes        Recent Inpatient Visit Summary  No Recent Inpatient Visits were found.  Care Team  Provider Specialty Phone Fax Service Dates   Rudolph Fines A., D.O. Pediatrics: Pediatric Critical Care Medicine [phone]) [phone]  Current      [facility]  This patient has registered at the [facility] Emergency Department  For more information visit: [email or web address]     PLEASE NOTE:     1.   Any care recommendations and other clinical information are provided as guidelines or for historical purposes only, and providers should exercise their own clinical judgment when providing care.    2.   You may only use this information for purposes of treatment, payment or health care operations activities, and subject to the limitations of applicable [facility] Policies.    3.   You should consult directly with the organization that provided a care guideline or other clinical history with any questions about additional information or accuracy or completeness of information provided.    ? 2025 [facility] - [email or web address]
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
## 2023-09-03 NOTE — ED Triage Notes (Signed)
 BIBA from home with c/o dizziness, vomiting, nausea and slight chest discomfort onset 20 min ago.  Pt 9-[redacted] wks pregnant states she was just watching tv when she felt suddenly ill, dizzy and started vomiting constantly.  Reports constipation for 2 weeks.  Denies sob, fever, or diarrhea.

## 2023-09-04 LAB — LAB USE ONLY - URINE GRAY CULTURE HOLD TUBE

## 2023-09-04 LAB — ECG 12-LEAD
Atrial Rate: 81 {beats}/min
IHS MUSE NARRATIVE AND IMPRESSION: NORMAL
P Axis: 40 degrees
P-R Interval: 170 ms
Q-T Interval: 392 ms
QRS Duration: 90 ms
QTC Calculation (Bezet): 455 ms
R Axis: 14 degrees
T Axis: 22 degrees
Ventricular Rate: 81 {beats}/min

## 2023-09-04 MED ORDER — MAGNESIUM HYDROXIDE 400 MG/5ML PO SUSP
30.0000 mL | Freq: Once | ORAL | Status: AC
Start: 2023-09-04 — End: 2023-09-04
  Administered 2023-09-04: 30 mL via ORAL
  Filled 2023-09-04: qty 30

## 2023-09-04 NOTE — Discharge Instructions (Addendum)
 Plenty of fluids is recommended as we discussed   The urine sample had a small amount of white blood cells but you do not have any symptoms of infection and the urine has been sent for culture. We will call if you need antibiotics as we discussed.     Use miralax 2 caps every 12 hours for constipation as we discussed

## 2023-09-05 ENCOUNTER — Ambulatory Visit: Payer: Self-pay | Admitting: Emergency Medicine

## 2023-09-05 LAB — CULTURE, URINE: Culture Urine: NORMAL

## 2023-12-03 ENCOUNTER — Emergency Department

## 2023-12-03 ENCOUNTER — Emergency Department
Admission: EM | Admit: 2023-12-03 | Discharge: 2023-12-03 | Disposition: A | Discharge status: Home / Self Care | Attending: Emergency Medicine | Admitting: Emergency Medicine

## 2023-12-03 DIAGNOSIS — R079 Chest pain, unspecified: Secondary | ICD-10-CM

## 2023-12-03 DIAGNOSIS — Z3A23 23 weeks gestation of pregnancy: Secondary | ICD-10-CM | POA: Insufficient documentation

## 2023-12-03 DIAGNOSIS — O99891 Other specified diseases and conditions complicating pregnancy: Secondary | ICD-10-CM | POA: Insufficient documentation

## 2023-12-03 DIAGNOSIS — J189 Pneumonia, unspecified organism: Secondary | ICD-10-CM | POA: Insufficient documentation

## 2023-12-03 DIAGNOSIS — O99512 Diseases of the respiratory system complicating pregnancy, second trimester: Secondary | ICD-10-CM | POA: Insufficient documentation

## 2023-12-03 DIAGNOSIS — J45909 Unspecified asthma, uncomplicated: Secondary | ICD-10-CM | POA: Insufficient documentation

## 2023-12-03 DIAGNOSIS — Z87891 Personal history of nicotine dependence: Secondary | ICD-10-CM | POA: Insufficient documentation

## 2023-12-03 DIAGNOSIS — R0602 Shortness of breath: Secondary | ICD-10-CM | POA: Insufficient documentation

## 2023-12-03 DIAGNOSIS — R0789 Other chest pain: Secondary | ICD-10-CM | POA: Insufficient documentation

## 2023-12-03 LAB — COMPREHENSIVE METABOLIC PANEL
ALT: <6 U/L (ref <=55)
AST (SGOT): 11 U/L (ref <=41)
Albumin/Globulin Ratio: 0.7 — ABNORMAL LOW (ref 0.9–2.2)
Albumin: 2.7 g/dL — ABNORMAL LOW (ref 3.5–5.0)
Alkaline Phosphatase: 45 U/L (ref 37–117)
Anion Gap: 7 (ref 5.0–15.0)
BUN: 8 mg/dL (ref 7–21)
Bilirubin, Total: 0.1 mg/dL — ABNORMAL LOW (ref 0.2–1.2)
CO2: 22 meq/L (ref 17–29)
Calcium: 8.2 mg/dL — ABNORMAL LOW (ref 8.5–10.5)
Chloride: 111 meq/L (ref 99–111)
Creatinine: 0.5 mg/dL (ref 0.4–1.0)
GFR: >60 mL/min/1.73 m2 (ref >=60.0)
Globulin: 3.7 g/dL — ABNORMAL HIGH (ref 2.0–3.6)
Glucose: 98 mg/dL (ref 70–100)
Potassium: 3.7 meq/L (ref 3.5–5.3)
Protein, Total: 6.4 g/dL (ref 6.0–8.3)
Sodium: 140 meq/L (ref 135–145)

## 2023-12-03 LAB — COVID-19 (SARS-COV-2) & INFLUENZA  A/B, NAA (ROCHE LIAT)
Influenza A RNA: NOT DETECTED
Influenza B RNA: NOT DETECTED
SARS-CoV-2 (COVID-19) RNA: NOT DETECTED

## 2023-12-03 LAB — LAB USE ONLY - CBC WITH DIFFERENTIAL
Absolute Basophils: 0.02 x10 3/uL (ref 0.00–0.08)
Absolute Eosinophils: 0.19 x10 3/uL (ref 0.00–0.44)
Absolute Immature Granulocytes: 0.06 x10 3/uL (ref 0.00–0.07)
Absolute Lymphocytes: 2.84 x10 3/uL (ref 0.42–3.22)
Absolute Monocytes: 0.65 x10 3/uL (ref 0.21–0.85)
Absolute Neutrophils: 7.05 x10 3/uL — ABNORMAL HIGH (ref 1.10–6.33)
Absolute nRBC: 0 x10 3/uL (ref <=0.00)
Basophils %: 0.2 %
Eosinophils %: 1.8 %
Hematocrit: 32.5 % — ABNORMAL LOW (ref 34.7–43.7)
Hemoglobin: 10.7 g/dL — ABNORMAL LOW (ref 11.4–14.8)
Immature Granulocytes %: 0.6 %
Lymphocytes %: 26.3 %
MCH: 26.7 pg (ref 25.1–33.5)
MCHC: 32.9 g/dL (ref 31.5–35.8)
MCV: 81 fL (ref 78.0–96.0)
MPV: 10 fL (ref 8.9–12.5)
Monocytes %: 6 %
Neutrophils %: 65.1 %
Platelet Count: 235 x10 3/uL (ref 142–346)
Preliminary Absolute Neutrophil Count: 7.05 x10 3/uL — ABNORMAL HIGH (ref 1.10–6.33)
RBC: 4.01 x10 6/uL (ref 3.90–5.10)
RDW: 15 % (ref 11–15)
WBC: 10.81 x10 3/uL — ABNORMAL HIGH (ref 3.10–9.50)
nRBC %: 0 /100{WBCs} (ref <=0.0)

## 2023-12-03 LAB — LAB USE ONLY - ESWAB HOLD TUBE

## 2023-12-03 LAB — HIGH SENSITIVITY TROPONIN-I: hs Troponin: <2.7 ng/L (ref <=14.0)

## 2023-12-03 LAB — D-DIMER: D-Dimer: 0.86 ug{FEU}/mL — ABNORMAL HIGH (ref <0.50)

## 2023-12-03 LAB — GROUP A STREP, RAPID ANTIGEN: Group A Strep, Rapid Antigen: NEGATIVE

## 2023-12-03 MED ORDER — ALBUTEROL SULFATE (2.5 MG/3ML) 0.083% IN NEBU
2.5000 mg | INHALATION_SOLUTION | Freq: Once | RESPIRATORY_TRACT | Status: AC
Start: 2023-12-03 — End: 2023-12-03
  Administered 2023-12-03: 2.5 mg via RESPIRATORY_TRACT
  Filled 2023-12-03: qty 3

## 2023-12-03 MED ORDER — AZITHROMYCIN 250 MG PO TABS
250.0000 mg | ORAL_TABLET | Freq: Every day | ORAL | 0 refills | Status: AC
Start: 2023-12-03 — End: 2023-12-07

## 2023-12-03 MED ORDER — IOHEXOL 350 MG/ML IV SOLN
80.0000 mL | Freq: Once | INTRAVENOUS | Status: AC | PRN
Start: 2023-12-03 — End: 2023-12-03
  Administered 2023-12-03: 80 mL via INTRAVENOUS
  Filled 2023-12-03: qty 100

## 2023-12-03 MED ORDER — AZITHROMYCIN 250 MG PO TABS
500.0000 mg | ORAL_TABLET | Freq: Once | ORAL | Status: AC
Start: 2023-12-03 — End: 2023-12-03
  Administered 2023-12-03: 500 mg via ORAL
  Filled 2023-12-03: qty 2

## 2023-12-03 MED ORDER — NEBULIZER MISC
0 refills | Status: AC
Start: 2023-12-03 — End: ?

## 2023-12-03 MED ORDER — ACETAMINOPHEN 500 MG PO TABS
1000.0000 mg | ORAL_TABLET | Freq: Once | ORAL | Status: AC
Start: 2023-12-03 — End: 2023-12-03
  Administered 2023-12-03: 1000 mg via ORAL
  Filled 2023-12-03: qty 2

## 2023-12-03 MED ORDER — LIDOCAINE 5 % EX PTCH
1.0000 | MEDICATED_PATCH | CUTANEOUS | Status: DC
Start: 2023-12-03 — End: 2023-12-03
  Filled 2023-12-03 (×2): qty 1

## 2023-12-03 MED ORDER — SODIUM CHLORIDE 0.9 % IV BOLUS
1000.0000 mL | Freq: Once | INTRAVENOUS | Status: AC
Start: 2023-12-03 — End: 2023-12-03
  Administered 2023-12-03: 1000 mL via INTRAVENOUS
  Filled 2023-12-03: qty 1000

## 2023-12-03 MED ORDER — LIDOCAINE 5 % EX PTCH
1.0000 | MEDICATED_PATCH | CUTANEOUS | 0 refills | Status: AC
Start: 2023-12-03 — End: ?

## 2023-12-03 MED ORDER — ALBUTEROL SULFATE (2.5 MG/3ML) 0.083% IN NEBU
2.5000 mg | INHALATION_SOLUTION | RESPIRATORY_TRACT | 0 refills | Status: AC | PRN
Start: 2023-12-03 — End: ?

## 2023-12-03 NOTE — ED Triage Notes (Signed)
 Pt to ED from home with c/o chest tightness/chest congestion, SOB, and sore throat/swallowing pain. 23 wks preg. G6P5. Pt reports being sick recently-URI type symptoms. Pt denies pregnancy related complaints. Reports normal fetal movement.

## 2023-12-03 NOTE — ED Notes (Signed)
 No need to collect 2nd trop as per [facility] NP

## 2023-12-03 NOTE — Discharge Instructions (Addendum)
 Take medications as prescribed. Follow up with your PCP/specialist at the next available appointment.  Return immediately for any worsening or concerning symptoms.

## 2023-12-03 NOTE — EDIE (Signed)
 [facility] NOTIFICATION 12/02/2023 19:33 Catherine Spence, Catherine Spence DOB: Jul 13, 1990 MRN: [id number]    [facility]'s patient encounter information:   FMW:?[id number]  Account [id number]  Billing Account [id number]      Criteria Met      5 ED Visits in 12 Months    Security and Safety  No Security Events were found.  ED Care Guidelines  There are currently no ED Care Guidelines for this patient. Please check your facility's medical records system.        Prescription Monitoring Program  000 ??- Narcotic Use Score   000 ??- Sedative Use Score   000 ??- Stimulant Use Score   000??- Overdose Risk Score  - All Scores range from 000-999 with 75% of the population scoring < 200 and only 1% scoring above 650  - The last digit of the narcotic, sedative, and stimulant score indicates the number of active prescriptions of that type  - Higher Use scores correlate with increased prescribers, pharmacies, mg equiv, and overlapping prescriptions   - Higher Overdose Risk Scores correlate with increased risk of unintentional overdose death   Concerning or unexpectedly high scores should prompt a review of the PMP record; this does not constitute checking PMP for prescribing purposes.    E.D. Visit Count (12 mo.)  Facility Visits   [place] - [facility] 3   [place] [facility]: [facility]) 3   Total 6   Note: Visits indicate total known visits.     Recent Emergency Department Visit Summary  Date Facility [facility] Type Diagnoses or Chief Complaint    Dec 03, 2023  [place] - [place].  [place]  Emergency      trouble breathing, pain and tightness in chest, [redacted] weeks pregnant      Sep 03, 2023  [place].  [place].  [place]  Emergency      Constipation, unspecified      Nausea with vomiting, unspecified      [redacted] weeks gestation of pregnancy      Contact with and (suspected) exposure to COVID-19      Hypomagnesemia      Nasal congestion      Dizziness and giddiness      Nausea      Emesis During Pregnancy       Dizziness      Aug 03, 2023  [place] [facility]: [facility])  [place].  [place]  Emergency      Abnormal findings on diagnostic imaging of other specified body structures      Unspecified abnormal finding in specimens from other organs, systems and tissues      Unspecified ovarian cyst, unspecified side      Dizziness and giddiness      Body mass index [BMI] 40.0-44.9, adult      Strain of muscle, fascia and tendon of lower back, initial encounter      Low back pain, unspecified      Less than [redacted] weeks gestation of pregnancy      Back Pain      Nausea with vomiting, unspecified      Jun 17, 2023  [place] [facility]: [facility])  [place].  [place]  Emergency      Other specified diseases of anus and rectum      Unspecified abdominal pain      Body mass index [BMI] 40.0-44.9, adult      Unspecified hemorrhoids      Constipation,  unspecified      Anal fissure, unspecified      rectal bleeding      Mar 26, 2023  [name]  [place]  Emergency      Abnormal uterine and vaginal bleeding, unspecified      Vaginal Bleeding-Pregnant      Jan 04, 2023  [place] [facility]: [facility])  [place].  [place]  Emergency      Nausea      Benign paroxysmal vertigo, unspecified ear      Chronic sinusitis, unspecified      sinus pressure      Headache      Dizziness      Emesis      Emesis and dizziness.        Recent Inpatient Visit Summary  No Recent Inpatient Visits were found.  Care Team  Provider Specialty Phone Fax Service Dates   Rudolph Fines A., D.O. Pediatrics: Pediatric Critical Care Medicine [phone]) [phone]  Current      [facility]  This patient has registered at the [facility] Emergency Department  For more information visit: [email or web address]    [email or web address]     PLEASE NOTE:     1.   Any care recommendations and other clinical information are provided as guidelines or for historical purposes only, and providers  should exercise their own clinical judgment when providing care.    2.   You may only use this information for purposes of treatment, payment or health care operations activities, and subject to the limitations of applicable [facility] Policies.    3.   You should consult directly with the organization that provided a care guideline or other clinical history with any questions about additional information or accuracy or completeness of information provided.    ? 2025 [facility] - [email or web address]
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
## 2023-12-04 LAB — ECG 12-LEAD
Atrial Rate: 88 {beats}/min
IHS MUSE NARRATIVE AND IMPRESSION: NORMAL
P Axis: 20 degrees
P-R Interval: 156 ms
Q-T Interval: 386 ms
QRS Duration: 84 ms
QTC Calculation (Bezet): 467 ms
R Axis: -10 degrees
T Axis: 36 degrees
Ventricular Rate: 88 {beats}/min

## 2023-12-05 ENCOUNTER — Ambulatory Visit: Payer: Self-pay

## 2023-12-05 LAB — CULTURE, THROAT

## 2024-01-25 ENCOUNTER — Emergency Department
Admission: EM | Admit: 2024-01-25 | Discharge: 2024-01-25 | Disposition: A | Discharge status: Home / Self Care | Attending: Emergency Medicine | Admitting: Emergency Medicine

## 2024-01-25 DIAGNOSIS — J019 Acute sinusitis, unspecified: Secondary | ICD-10-CM | POA: Insufficient documentation

## 2024-01-25 DIAGNOSIS — R197 Diarrhea, unspecified: Secondary | ICD-10-CM

## 2024-01-25 DIAGNOSIS — R112 Nausea with vomiting, unspecified: Secondary | ICD-10-CM

## 2024-01-25 DIAGNOSIS — R42 Dizziness and giddiness: Secondary | ICD-10-CM | POA: Insufficient documentation

## 2024-01-25 DIAGNOSIS — O99513 Diseases of the respiratory system complicating pregnancy, third trimester: Secondary | ICD-10-CM | POA: Insufficient documentation

## 2024-01-25 DIAGNOSIS — Z3A34 34 weeks gestation of pregnancy: Secondary | ICD-10-CM | POA: Insufficient documentation

## 2024-01-25 DIAGNOSIS — O26893 Other specified pregnancy related conditions, third trimester: Secondary | ICD-10-CM | POA: Insufficient documentation

## 2024-01-25 DIAGNOSIS — O99283 Endocrine, nutritional and metabolic diseases complicating pregnancy, third trimester: Secondary | ICD-10-CM | POA: Insufficient documentation

## 2024-01-25 DIAGNOSIS — A084 Viral intestinal infection, unspecified: Secondary | ICD-10-CM | POA: Insufficient documentation

## 2024-01-25 DIAGNOSIS — E860 Dehydration: Secondary | ICD-10-CM | POA: Insufficient documentation

## 2024-01-25 DIAGNOSIS — Z87891 Personal history of nicotine dependence: Secondary | ICD-10-CM | POA: Insufficient documentation

## 2024-01-25 DIAGNOSIS — Z3A31 31 weeks gestation of pregnancy: Secondary | ICD-10-CM

## 2024-01-25 DIAGNOSIS — O219 Vomiting of pregnancy, unspecified: Secondary | ICD-10-CM | POA: Insufficient documentation

## 2024-01-25 LAB — LAB USE ONLY - CBC WITH DIFFERENTIAL
Absolute Basophils: 0.01 x10 3/uL (ref 0.00–0.08)
Absolute Eosinophils: 0.12 x10 3/uL (ref 0.00–0.44)
Absolute Immature Granulocytes: 0.05 x10 3/uL (ref 0.00–0.07)
Absolute Lymphocytes: 2.24 x10 3/uL (ref 0.42–3.22)
Absolute Monocytes: 0.6 x10 3/uL (ref 0.21–0.85)
Absolute Neutrophils: 7.4 x10 3/uL — ABNORMAL HIGH (ref 1.10–6.33)
Absolute nRBC: 0 x10 3/uL (ref <=0.00)
Basophils %: 0.1 %
Eosinophils %: 1.2 %
Hematocrit: 30.4 % — ABNORMAL LOW (ref 34.7–43.7)
Hemoglobin: 10.1 g/dL — ABNORMAL LOW (ref 11.4–14.8)
Immature Granulocytes %: 0.5 %
Lymphocytes %: 21.5 %
MCH: 26.7 pg (ref 25.1–33.5)
MCHC: 33.2 g/dL (ref 31.5–35.8)
MCV: 80.4 fL (ref 78.0–96.0)
MPV: 9.1 fL (ref 8.9–12.5)
Monocytes %: 5.8 %
Neutrophils %: 70.9 %
Platelet Count: 207 x10 3/uL (ref 142–346)
Preliminary Absolute Neutrophil Count: 7.4 x10 3/uL — ABNORMAL HIGH (ref 1.10–6.33)
RBC: 3.78 x10 6/uL — ABNORMAL LOW (ref 3.90–5.10)
RDW: 15 % (ref 11–15)
WBC: 10.42 x10 3/uL — ABNORMAL HIGH (ref 3.10–9.50)
nRBC %: 0 /100{WBCs} (ref <=0.0)

## 2024-01-25 LAB — URINALYSIS WITH REFLEX TO MICROSCOPIC EXAM - REFLEX TO CULTURE
Urine Bilirubin: NEGATIVE
Urine Blood: NEGATIVE
Urine Glucose: NEGATIVE
Urine Ketones: NEGATIVE mg/dL
Urine Leukocyte Esterase: NEGATIVE
Urine Nitrite: NEGATIVE
Urine Specific Gravity: 1.026 (ref 1.001–1.035)
Urine Urobilinogen: NORMAL mg/dL (ref 0.2–2.0)
Urine pH: 6.5 (ref 5.0–8.0)

## 2024-01-25 LAB — COMPREHENSIVE METABOLIC PANEL
ALT: 9 U/L (ref <=55)
AST (SGOT): 11 U/L (ref <=41)
Albumin/Globulin Ratio: 1 (ref 0.9–2.2)
Albumin: 2.8 g/dL — ABNORMAL LOW (ref 3.5–4.9)
Alkaline Phosphatase: 57 U/L (ref 37–117)
Anion Gap: 8 (ref 5.0–15.0)
BUN: 6 mg/dL — ABNORMAL LOW (ref 7–21)
Bilirubin, Total: 0.2 mg/dL (ref 0.2–1.2)
CO2: 21 meq/L (ref 17–29)
Calcium: 8 mg/dL — ABNORMAL LOW (ref 8.5–10.5)
Chloride: 108 meq/L (ref 99–111)
Creatinine: 0.4 mg/dL (ref 0.4–1.0)
GFR: >60 mL/min/1.73 m2 (ref >=60.0)
Globulin: 2.8 g/dL (ref 2.0–3.6)
Glucose: 81 mg/dL (ref 70–100)
Potassium: 4 meq/L (ref 3.5–5.3)
Protein, Total: 5.6 g/dL — ABNORMAL LOW (ref 6.0–8.3)
Sodium: 137 meq/L (ref 135–145)

## 2024-01-25 LAB — LAB USE ONLY - URINE GRAY CULTURE HOLD TUBE

## 2024-01-25 LAB — COVID-19 (SARS-COV-2) & INFLUENZA  A/B, NAA (ROCHE LIAT)
Influenza A RNA: NOT DETECTED
Influenza B RNA: NOT DETECTED
SARS-CoV-2 (COVID-19) RNA: NOT DETECTED

## 2024-01-25 LAB — MAGNESIUM: Magnesium: 1.6 mg/dL (ref 1.6–2.6)

## 2024-01-25 LAB — LIPASE: Lipase: 16 U/L (ref 8–78)

## 2024-01-25 MED ORDER — LACTATED RINGERS IV BOLUS
1000.0000 mL | Freq: Once | INTRAVENOUS | Status: AC
Start: 2024-01-25 — End: 2024-01-25
  Administered 2024-01-25: 1000 mL via INTRAVENOUS
  Filled 2024-01-25: qty 1000

## 2024-01-25 NOTE — Discharge Instructions (Signed)
 YOU MAY SEE THE DOCTOR LISTED IF YOUR DOCTOR IS NOT AVAILABLE FOR FOLLOW UP IN THE TIME FRAME PROVIDED.  RETURN TO THE EMERGENCY DEPARTMENT IMMEDIATELY FOR ANY WORSENING OR CONCERNS.    See your obstetrician (pregnancy doctor) as scheduled next week.

## 2024-01-25 NOTE — EDIE (Signed)
 [facility] NOTIFICATION 01/25/2024 09:27 Catherine Spence, Catherine Spence DOB: 17-May-1990 MRN: [id number]    Criteria Met      5 ED Visits in 12 Months    Security and Safety  No Security Events were found.  ED Care Guidelines  There are currently no ED Care Guidelines for this patient. Please check your facility's medical records system.        Prescription Drug Report (12 Mo.)  No Prescription Drug Data was found.    E.D. Visit Count (12 mo.)  Facility Visits   [name]   [place] [facility]: [facility]) 3   Total 6   Note: Visits indicate total known visits.     Recent Emergency Department Visit Summary  Date Facility [facility] Type Diagnoses or Chief Complaint    Jan 25, 2024  [place] [facility]: [facility])  [place].  [place]  Emergency      Emesis      Dec 03, 2023  [place] - [name]  [place]  Emergency      Unspecified asthma, uncomplicated      Shortness of breath      Chest pain, unspecified      Pneumonia, unspecified organism      trouble breathing, pain and tightness in chest, [redacted] weeks pregnant      Sep 03, 2023  [place] - [place].  [name]  [place]  Emergency      Constipation, unspecified      Nausea with vomiting, unspecified      [redacted] weeks gestation of pregnancy      Contact with and (suspected) exposure to COVID-19      Hypomagnesemia      Nasal congestion      Dizziness and giddiness      Nausea      Emesis During Pregnancy      Dizziness      Aug 03, 2023  [place] [facility]: [facility])  [place].  [place]  Emergency      Abnormal findings on diagnostic imaging of other specified body structures      Unspecified abnormal finding in specimens from other organs, systems and tissues      Unspecified ovarian cyst, unspecified side      Dizziness and giddiness      Body mass index [BMI] 40.0-44.9, adult      Strain of muscle, fascia and tendon of lower back, initial encounter      Low back pain, unspecified      Less than [redacted] weeks gestation of pregnancy      Back Pain      Nausea with  vomiting, unspecified      Jun 17, 2023  [place] [facility]: [facility])  [place].  [place]  Emergency      Other specified diseases of anus and rectum      Unspecified abdominal pain      Body mass index [BMI] 40.0-44.9, adult      Unspecified hemorrhoids      Constipation, unspecified      Anal fissure, unspecified      rectal bleeding      Mar 26, 2023  [place] - [name]  [place]  Emergency      Abnormal uterine and vaginal bleeding, unspecified      Vaginal Bleeding-Pregnant        Recent Inpatient Visit Summary  No Recent Inpatient Visits were found.  Care Team  Provider Specialty  Phone Fax Service Dates   Rudolph Fines A., D.O. Pediatrics: Pediatric Critical Care Medicine [phone]) [phone]  Current      [facility]  This patient has registered at the [place] [facility]: [facility]) Emergency Department  For more information visit: [email or web address]     PLEASE NOTE:     1.   Any care recommendations and other clinical information are provided as guidelines or for historical purposes only, and providers should exercise their own clinical judgment when providing care.    2.   You may only use this information for purposes of treatment, payment or health care operations activities, and subject to the limitations of applicable [facility] Policies.    3.   You should consult directly with the organization that provided a care guideline or other clinical history with any questions about additional information or accuracy or completeness of information provided.    ? 2025 [facility] - [email or web address]

## 2024-01-25 NOTE — ED Triage Notes (Signed)
 Patient who is [redacted] weeks pregnant has had NVD for for days.  She has 4 children, 3 are not ill, the three year old is here with her today with emesis as well.  She had diarrhea one time yesterday.  No urinary changes.  Consistent nausea.  Has thrown up 1x today so far.  Is able to drink water 
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
## 2024-02-01 ENCOUNTER — Emergency Department: Admission: EM | Admit: 2024-02-01 | Discharge: 2024-02-01 | Disposition: A | Discharge status: Home / Self Care

## 2024-02-01 DIAGNOSIS — N898 Other specified noninflammatory disorders of vagina: Secondary | ICD-10-CM | POA: Insufficient documentation

## 2024-02-01 DIAGNOSIS — O26893 Other specified pregnancy related conditions, third trimester: Secondary | ICD-10-CM | POA: Insufficient documentation

## 2024-02-01 DIAGNOSIS — Z3A00 Weeks of gestation of pregnancy not specified: Secondary | ICD-10-CM | POA: Insufficient documentation

## 2024-02-01 LAB — RUPTURE OF MEMBRANE: Rupture of Membrane AmniSure: NEGATIVE

## 2024-02-01 MED ORDER — FLUCONAZOLE 150 MG PO TABS
150.0000 mg | ORAL_TABLET | ORAL | 0 refills | Status: AC | PRN
Start: 2024-02-01 — End: 2024-02-04

## 2024-02-01 MED ORDER — LIDOCAINE 5 % EX PTCH
1.0000 | MEDICATED_PATCH | CUTANEOUS | 0 refills | Status: AC
Start: 2024-02-01 — End: ?
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
## 2024-02-01 NOTE — EDIE (Signed)
 [facility] NOTIFICATION 02/01/2024 15:48 Catherine Spence, Catherine Spence DOB: 12/22/1990 MRN: [id number]    [facility]'s patient encounter information:   FMW:?[id number]  Account [id number]  Billing Account [id number]      Criteria Met      5 ED Visits in 12 Months    Security and Safety  No Security Events were found.  ED Care Guidelines  There are currently no ED Care Guidelines for this patient. Please check your facility's medical records system.        Prescription Monitoring Program  000 ??- Narcotic Use Score   000 ??- Sedative Use Score   000 ??- Stimulant Use Score   000??- Overdose Risk Score  - All Scores range from 000-999 with 75% of the population scoring < 200 and only 1% scoring above 650  - The last digit of the narcotic, sedative, and stimulant score indicates the number of active prescriptions of that type  - Higher Use scores correlate with increased prescribers, pharmacies, mg equiv, and overlapping prescriptions   - Higher Overdose Risk Scores correlate with increased risk of unintentional overdose death   Concerning or unexpectedly high scores should prompt a review of the PMP record; this does not constitute checking PMP for prescribing purposes.    E.D. Visit Count (12 mo.)  Facility Visits   [place] [facility] 4   [place] [facility]: [facility]) 3   Total 7   Note: Visits indicate total known visits.     Recent Emergency Department Visit Summary  Date Facility [facility] Type Diagnoses or Chief Complaint    Feb 01, 2024  [place].  [place]  Emergency      SROM? contractions      Jan 25, 2024  [place] [facility]: [facility])  [place].  [place]  Emergency      Dehydration      [redacted] weeks gestation of pregnancy      Diarrhea, unspecified      Nausea with vomiting, unspecified      Emesis      Dec 03, 2023  [place] - [place].  [place]  Emergency      Unspecified asthma, uncomplicated      Shortness of breath      Chest pain, unspecified       Pneumonia, unspecified organism      trouble breathing, pain and tightness in chest, [redacted] weeks pregnant      Sep 03, 2023  [place].  [place].  [place]  Emergency      Constipation, unspecified      Nausea with vomiting, unspecified      [redacted] weeks gestation of pregnancy      Contact with and (suspected) exposure to COVID-19      Hypomagnesemia      Nasal congestion      Dizziness and giddiness      Nausea      Emesis During Pregnancy      Dizziness      Aug 03, 2023   Emergency Room: [facility])  [place].  [place]  Emergency      Abnormal findings on diagnostic imaging of other specified body structures      Unspecified abnormal finding in specimens from other organs, systems and tissues      Unspecified ovarian cyst, unspecified side      Dizziness and giddiness      Body mass index [BMI] 40.0-44.9, adult  Strain of muscle, fascia and tendon of lower back, initial encounter      Low back pain, unspecified      Less than [redacted] weeks gestation of pregnancy      Back Pain      Nausea with vomiting, unspecified      Jun 17, 2023  [place] [facility]: [facility])  [place].  [place]  Emergency      Other specified diseases of anus and rectum      Unspecified abdominal pain      Body mass index [BMI] 40.0-44.9, adult      Unspecified hemorrhoids      Constipation, unspecified      Anal fissure, unspecified      rectal bleeding      Mar 26, 2023  [place] [facility] [place] [name]  [place]  Emergency      Abnormal uterine and vaginal bleeding, unspecified      Vaginal Bleeding-Pregnant        Recent Inpatient Visit Summary  No Recent Inpatient Visits were found.  Care Team  Provider Specialty Phone Fax Service Dates   Rudolph Fines A., D.O. Pediatrics: Pediatric Critical Care Medicine [phone]) [phone]  Current      [facility]  This patient has registered at the [facility] Emergency Department  For more information visit: [email or web address]    VHI  [email or web address]     PLEASE NOTE:     1.   Any care recommendations and other clinical information are provided as guidelines or for historical purposes only, and providers should exercise their own clinical judgment when providing care.    2.   You may only use this information for purposes of treatment, payment or health care operations activities, and subject to the limitations of applicable [facility] Policies.    3.   You should consult directly with the organization that provided a care guideline or other clinical history with any questions about additional information or accuracy or completeness of information provided.    ? 2025 [facility] - [email or web address]

## 2024-02-01 NOTE — Progress Notes (Signed)
 Pt in OBED for leaking of fluid and contractions. Pt reports that she is having chunky discharge. Kelly, CNM placing orders for Urine culture and medication for yeast infection. Amnisure neg. Pt can be discharged home

## 2024-02-01 NOTE — ED Notes (Signed)
 Verbal discharge instructions reviewed with pt by CNM. Pt instructed to pick up prescriptions from pharmacy and CNM will call with results. All questions and concerns addressed. Pt discharged home in stable, Undelivered condition.

## 2024-02-02 LAB — CULTURE, URINE: Culture Urine: NORMAL

## 2024-02-14 LAB — GROUP B STREP TRANSCRIBED: GBS Transcribed: NEGATIVE

## 2024-02-16 ENCOUNTER — Encounter: Payer: Self-pay | Admitting: Obstetrics & Gynecology

## 2024-02-16 ENCOUNTER — Observation Stay
Admission: EM | Admit: 2024-02-16 | Discharge: 2024-02-16 | Disposition: A | Discharge status: Home / Self Care | Source: Ambulatory Visit | Attending: Obstetrics & Gynecology | Admitting: Obstetrics & Gynecology

## 2024-02-16 DIAGNOSIS — O26893 Other specified pregnancy related conditions, third trimester: Principal | ICD-10-CM | POA: Insufficient documentation

## 2024-02-16 DIAGNOSIS — Z3A34 34 weeks gestation of pregnancy: Secondary | ICD-10-CM | POA: Insufficient documentation

## 2024-02-16 DIAGNOSIS — O09293 Supervision of pregnancy with other poor reproductive or obstetric history, third trimester: Secondary | ICD-10-CM | POA: Insufficient documentation

## 2024-02-16 DIAGNOSIS — O163 Unspecified maternal hypertension, third trimester: Principal | ICD-10-CM | POA: Diagnosis present

## 2024-02-16 DIAGNOSIS — R1030 Lower abdominal pain, unspecified: Secondary | ICD-10-CM | POA: Insufficient documentation

## 2024-02-16 DIAGNOSIS — M549 Dorsalgia, unspecified: Secondary | ICD-10-CM | POA: Insufficient documentation

## 2024-02-16 LAB — LAB USE ONLY - URINE GRAY CULTURE HOLD TUBE

## 2024-02-16 LAB — CBC
Absolute nRBC: 0 x10 3/uL (ref <=0.00)
Hematocrit: 31.3 % — ABNORMAL LOW (ref 34.7–43.7)
Hemoglobin: 10.3 g/dL — ABNORMAL LOW (ref 11.4–14.8)
MCH: 25.7 pg (ref 25.1–33.5)
MCHC: 32.9 g/dL (ref 31.5–35.8)
MCV: 78.1 fL (ref 78.0–96.0)
MPV: 9.6 fL (ref 8.9–12.5)
Platelet Count: 238 x10 3/uL (ref 142–346)
RBC: 4.01 x10 6/uL (ref 3.90–5.10)
RDW: 15 % (ref 11–15)
WBC: 12.1 x10 3/uL — ABNORMAL HIGH (ref 3.10–9.50)
nRBC %: 0 /100{WBCs} (ref <=0.0)

## 2024-02-16 LAB — ALT: ALT: 10 U/L (ref <=55)

## 2024-02-16 LAB — URINALYSIS WITH REFLEX TO MICROSCOPIC EXAM IF INDICATED
Urine Bilirubin: NEGATIVE
Urine Blood: NEGATIVE
Urine Glucose: NEGATIVE
Urine Leukocyte Esterase: NEGATIVE
Urine Nitrite: NEGATIVE
Urine Protein: NEGATIVE
Urine Specific Gravity: 1.021 (ref 1.001–1.035)
Urine Urobilinogen: NORMAL mg/dL (ref 0.2–2.0)
Urine pH: 6.5 (ref 5.0–8.0)

## 2024-02-16 LAB — URINE PROTEIN/CREATININE RATIO
Urine Creatinine: 117.2 mg/dL
Urine Protein/Creatinine Ratio: 0.1
Urine Protein: 10 mg/dL (ref 1.0–14.0)

## 2024-02-16 LAB — CREATININE
Creatinine: 0.5 mg/dL (ref 0.4–1.0)
GFR: >60 mL/min/1.73 m2 (ref >=60.0)

## 2024-02-16 LAB — URIC ACID: Uric Acid: 5.5 mg/dL (ref 2.6–7.1)

## 2024-02-16 LAB — LACTATE DEHYDROGENASE: LDH: 148 U/L (ref 120–331)

## 2024-02-16 LAB — FIBRINOGEN: Fibrinogen: 346 mg/dL (ref 181–413)

## 2024-02-16 LAB — AST: AST (SGOT): 15 U/L (ref <=41)

## 2024-02-16 MED ORDER — LACTATED RINGERS IV BOLUS
1000.0000 mL | Freq: Once | INTRAVENOUS | Status: DC
  Filled 2024-02-16: qty 1000

## 2024-02-16 MED ORDER — LACTATED RINGERS IV BOLUS
1000.0000 mL | Freq: Once | INTRAVENOUS | Status: AC
Start: 2024-02-16 — End: 2024-02-16
  Administered 2024-02-16: 1000 mL via INTRAVENOUS
  Filled 2024-02-16: qty 1000

## 2024-02-16 MED ORDER — ACETAMINOPHEN 500 MG PO TABS
1000.0000 mg | ORAL_TABLET | Freq: Once | ORAL | Status: AC
Start: 2024-02-16 — End: 2024-02-16
  Administered 2024-02-16: 1000 mg via ORAL
  Filled 2024-02-16: qty 2

## 2024-02-16 NOTE — Progress Notes (Signed)
 Verbal discharge instructions reviewed with pt. All questions and concerns addressed. Encouraged pt to keep follow up appointment, take tylenol  tonight if pain comes back, increase fluid intake and call OB if any concerns. Pt instructed that someone will call pt with induction date.
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
## 2024-02-17 LAB — CULTURE, URINE: Culture Urine: NORMAL

## 2024-02-26 ENCOUNTER — Observation Stay

## 2024-02-26 ENCOUNTER — Inpatient Hospital Stay
Admission: EM | Admit: 2024-02-26 | Discharge: 2024-02-29 | DRG: 560 | Disposition: A | Discharge status: Home / Self Care | Attending: Obstetrics & Gynecology | Admitting: Obstetrics & Gynecology

## 2024-02-26 DIAGNOSIS — O99344 Other mental disorders complicating childbirth: Secondary | ICD-10-CM | POA: Diagnosis present

## 2024-02-26 DIAGNOSIS — F17210 Nicotine dependence, cigarettes, uncomplicated: Secondary | ICD-10-CM | POA: Diagnosis present

## 2024-02-26 DIAGNOSIS — Z79899 Other long term (current) drug therapy: Secondary | ICD-10-CM

## 2024-02-26 DIAGNOSIS — G43909 Migraine, unspecified, not intractable, without status migrainosus: Secondary | ICD-10-CM | POA: Diagnosis present

## 2024-02-26 DIAGNOSIS — Z8616 Personal history of COVID-19: Secondary | ICD-10-CM

## 2024-02-26 DIAGNOSIS — F4310 Post-traumatic stress disorder, unspecified: Secondary | ICD-10-CM | POA: Diagnosis present

## 2024-02-26 DIAGNOSIS — O99214 Obesity complicating childbirth: Secondary | ICD-10-CM | POA: Diagnosis present

## 2024-02-26 DIAGNOSIS — O26893 Other specified pregnancy related conditions, third trimester: Secondary | ICD-10-CM | POA: Diagnosis present

## 2024-02-26 DIAGNOSIS — O3483 Maternal care for other abnormalities of pelvic organs, third trimester: Secondary | ICD-10-CM | POA: Diagnosis present

## 2024-02-26 DIAGNOSIS — Z7951 Long term (current) use of inhaled steroids: Secondary | ICD-10-CM

## 2024-02-26 DIAGNOSIS — O9952 Diseases of the respiratory system complicating childbirth: Principal | ICD-10-CM | POA: Diagnosis present

## 2024-02-26 DIAGNOSIS — N83202 Unspecified ovarian cyst, left side: Secondary | ICD-10-CM | POA: Diagnosis present

## 2024-02-26 DIAGNOSIS — Z3A35 35 weeks gestation of pregnancy: Principal | ICD-10-CM

## 2024-02-26 DIAGNOSIS — D509 Iron deficiency anemia, unspecified: Secondary | ICD-10-CM | POA: Diagnosis present

## 2024-02-26 DIAGNOSIS — Z880 Allergy status to penicillin: Secondary | ICD-10-CM

## 2024-02-26 DIAGNOSIS — J45909 Unspecified asthma, uncomplicated: Secondary | ICD-10-CM | POA: Diagnosis present

## 2024-02-26 DIAGNOSIS — K219 Gastro-esophageal reflux disease without esophagitis: Secondary | ICD-10-CM | POA: Diagnosis present

## 2024-02-26 DIAGNOSIS — K589 Irritable bowel syndrome without diarrhea: Secondary | ICD-10-CM | POA: Diagnosis present

## 2024-02-26 DIAGNOSIS — Z3A36 36 weeks gestation of pregnancy: Secondary | ICD-10-CM

## 2024-02-26 DIAGNOSIS — M419 Scoliosis, unspecified: Secondary | ICD-10-CM | POA: Diagnosis present

## 2024-02-26 DIAGNOSIS — O99334 Smoking (tobacco) complicating childbirth: Secondary | ICD-10-CM | POA: Diagnosis present

## 2024-02-26 LAB — LAB USE ONLY - CBC WITH DIFFERENTIAL
Absolute Basophils: 0.03 x10 3/uL (ref 0.00–0.08)
Absolute Eosinophils: 0.15 x10 3/uL (ref 0.00–0.44)
Absolute Immature Granulocytes: 0.15 x10 3/uL — ABNORMAL HIGH (ref 0.00–0.07)
Absolute Lymphocytes: 2.6 x10 3/uL (ref 0.42–3.22)
Absolute Monocytes: 0.74 x10 3/uL (ref 0.21–0.85)
Absolute Neutrophils: 7.01 x10 3/uL — ABNORMAL HIGH (ref 1.10–6.33)
Absolute nRBC: 0 x10 3/uL (ref <=0.00)
Basophils %: 0.3 %
Eosinophils %: 1.4 %
Hematocrit: 30.2 % — ABNORMAL LOW (ref 34.7–43.7)
Hemoglobin: 9.5 g/dL — ABNORMAL LOW (ref 11.4–14.8)
Immature Granulocytes %: 1.4 %
Lymphocytes %: 24.3 %
MCH: 24.9 pg — ABNORMAL LOW (ref 25.1–33.5)
MCHC: 31.5 g/dL (ref 31.5–35.8)
MCV: 79.3 fL (ref 78.0–96.0)
MPV: 9.7 fL (ref 8.9–12.5)
Monocytes %: 6.9 %
Neutrophils %: 65.7 %
Platelet Count: 235 x10 3/uL (ref 142–346)
Preliminary Absolute Neutrophil Count: 7.01 x10 3/uL — ABNORMAL HIGH (ref 1.10–6.33)
RBC: 3.81 x10 6/uL — ABNORMAL LOW (ref 3.90–5.10)
RDW: 15 % (ref 11–15)
WBC: 10.68 x10 3/uL — ABNORMAL HIGH (ref 3.10–9.50)
nRBC %: 0 /100{WBCs} (ref <=0.0)

## 2024-02-26 LAB — TYPE AND SCREEN
ABO Rh: O POS
Antibody Screen: NEGATIVE

## 2024-02-26 MED ORDER — TRANEXAMIC ACID-NACL 1000-0.7 MG/100ML-% IV SOLN
1000.0000 mg | Freq: Once | INTRAVENOUS | Status: DC | PRN
Start: 2024-02-26 — End: 2024-02-27

## 2024-02-26 MED ORDER — LACTATED RINGERS IV SOLN
INTRAVENOUS | Status: DC
Start: 2024-02-26 — End: 2024-02-27
  Filled 2024-02-26: qty 1000

## 2024-02-26 MED ORDER — OXYTOCIN-SODIUM CHLORIDE 30-0.9 UT/500ML-% IV SOLN
125.0000 m[IU]/min | INTRAVENOUS | Status: DC | PRN
Start: 2024-02-26 — End: 2024-02-27

## 2024-02-26 MED ORDER — SIMETHICONE 80 MG PO CHEW
80.0000 mg | CHEWABLE_TABLET | Freq: Four times a day (QID) | ORAL | Status: DC | PRN
Start: 2024-02-26 — End: 2024-02-27

## 2024-02-26 MED ORDER — NIFEDIPINE 10 MG PO CAPS
10.0000 mg | ORAL_CAPSULE | Freq: Once | ORAL | Status: DC | PRN
Start: 2024-02-26 — End: 2024-02-27

## 2024-02-26 MED ORDER — OXYTOCIN 10 UNIT/ML IJ SOLN
10.0000 [IU] | Freq: Once | INTRAMUSCULAR | Status: DC | PRN
Start: 2024-02-26 — End: 2024-02-27

## 2024-02-26 MED ORDER — ONDANSETRON HCL 4 MG PO TABS
4.0000 mg | ORAL_TABLET | Freq: Three times a day (TID) | ORAL | Status: DC | PRN
Start: 2024-02-26 — End: 2024-02-27

## 2024-02-26 MED ORDER — LIDOCAINE HCL (PF) 1 % IJ SOLN
10.0000 mL | Freq: Once | INTRAMUSCULAR | Status: DC | PRN
Start: 2024-02-26 — End: 2024-02-27

## 2024-02-26 MED ORDER — ACETAMINOPHEN 500 MG PO TABS
500.0000 mg | ORAL_TABLET | ORAL | Status: DC | PRN
Start: 2024-02-26 — End: 2024-02-27

## 2024-02-26 MED ORDER — ZOLPIDEM TARTRATE 5 MG PO TABS
5.0000 mg | ORAL_TABLET | Freq: Every evening | ORAL | Status: DC | PRN
Start: 2024-02-26 — End: 2024-02-27

## 2024-02-26 MED ORDER — MISOPROSTOL 25 MCG PO SPLIT TAB
50.0000 ug | ORAL_TABLET | ORAL | Status: DC | PRN
Start: 2024-02-26 — End: 2024-02-27
  Administered 2024-02-26 – 2024-02-27 (×2): 50 ug via ORAL
  Filled 2024-02-26 (×2): qty 2

## 2024-02-26 MED ORDER — NALOXONE HCL 0.4 MG/ML IJ SOLN (WRAP)
0.2000 mg | INTRAMUSCULAR | Status: DC | PRN
Start: 2024-02-26 — End: 2024-02-27

## 2024-02-26 MED ORDER — PROMETHAZINE HCL 25 MG PO TABS
25.0000 mg | ORAL_TABLET | Freq: Four times a day (QID) | ORAL | Status: DC | PRN
Start: 2024-02-26 — End: 2024-02-27

## 2024-02-26 MED ORDER — FAMOTIDINE 20 MG PO TABS
20.0000 mg | ORAL_TABLET | Freq: Two times a day (BID) | ORAL | Status: DC | PRN
Start: 2024-02-26 — End: 2024-02-27

## 2024-02-26 MED ORDER — PROMETHAZINE HCL 25 MG RE SUPP
25.0000 mg | Freq: Four times a day (QID) | RECTAL | Status: DC | PRN
Start: 2024-02-26 — End: 2024-02-27

## 2024-02-26 MED ORDER — PROCHLORPERAZINE EDISYLATE 10 MG/2ML IJ SOLN
10.0000 mg | Freq: Four times a day (QID) | INTRAMUSCULAR | Status: DC | PRN
Start: 2024-02-26 — End: 2024-02-27

## 2024-02-26 MED ORDER — SODIUM CHLORIDE (PF) 0.9 % IJ SOLN
3.0000 mL | Freq: Three times a day (TID) | INTRAMUSCULAR | Status: DC
Start: 2024-02-26 — End: 2024-02-27
  Filled 2024-02-26: qty 10

## 2024-02-26 MED ORDER — ONDANSETRON HCL 4 MG/2ML IJ SOLN
4.0000 mg | Freq: Three times a day (TID) | INTRAMUSCULAR | Status: DC | PRN
Start: 2024-02-26 — End: 2024-02-27
  Administered 2024-02-27 (×2): 4 mg via INTRAVENOUS
  Filled 2024-02-26 (×2): qty 2

## 2024-02-26 MED ORDER — SOD CITRATE-CITRIC ACID 500-334 MG/5ML PO SOLN
30.0000 mL | Freq: Once | ORAL | Status: DC | PRN
Start: 2024-02-26 — End: 2024-02-27

## 2024-02-26 MED ORDER — NIFEDIPINE 10 MG PO CAPS
20.0000 mg | ORAL_CAPSULE | Freq: Once | ORAL | Status: DC | PRN
Start: 2024-02-26 — End: 2024-02-27

## 2024-02-26 MED ORDER — CALCIUM CARBONATE ANTACID 500 MG PO CHEW
1000.0000 mg | CHEWABLE_TABLET | Freq: Three times a day (TID) | ORAL | Status: DC | PRN
Start: 2024-02-26 — End: 2024-02-27

## 2024-02-26 MED ORDER — BETAMETHASONE SOD PHOS & ACET 6 (3-3) MG/ML IJ SUSP
12.0000 mg | INTRAMUSCULAR | Status: DC
Start: 2024-02-26 — End: 2024-02-28
  Administered 2024-02-26: 12 mg via INTRAMUSCULAR
  Filled 2024-02-26: qty 5

## 2024-02-26 MED ORDER — ACETAMINOPHEN 650 MG RE SUPP
650.0000 mg | RECTAL | Status: DC | PRN
Start: 2024-02-26 — End: 2024-02-27

## 2024-02-26 MED ORDER — METHYLERGONOVINE MALEATE 0.2 MG/ML IJ SOLN
200.0000 ug | INTRAMUSCULAR | Status: DC | PRN
Start: 2024-02-26 — End: 2024-02-27

## 2024-02-26 MED ORDER — CARBOPROST TROMETHAMINE 250 MCG/ML IM SOLN
250.0000 ug | Freq: Once | INTRAMUSCULAR | Status: DC | PRN
Start: 2024-02-26 — End: 2024-02-27

## 2024-02-26 MED ORDER — FAMOTIDINE 10 MG/ML IV SOLN (WRAP)
20.0000 mg | Freq: Two times a day (BID) | INTRAVENOUS | Status: DC | PRN
Start: 2024-02-26 — End: 2024-02-27
  Administered 2024-02-27: 20 mg via INTRAVENOUS
  Filled 2024-02-26: qty 2

## 2024-02-26 MED ORDER — MISOPROSTOL 200 MCG PO TABS
1000.0000 ug | ORAL_TABLET | Freq: Once | ORAL | Status: DC | PRN
Start: 2024-02-26 — End: 2024-02-27

## 2024-02-26 MED ORDER — ALUM & MAG HYDROXIDE-SIMETH 200-200-20 MG/5ML PO SUSP
30.0000 mL | Freq: Four times a day (QID) | ORAL | Status: DC | PRN
Start: 2024-02-26 — End: 2024-02-27

## 2024-02-26 MED ORDER — TERBUTALINE SULFATE 1 MG/ML IJ SOLN
0.2500 mg | Freq: Once | INTRAMUSCULAR | Status: DC | PRN
Start: 2024-02-26 — End: 2024-02-27

## 2024-02-26 MED ORDER — MINERAL OIL PO OIL
30.0000 mL | TOPICAL_OIL | Freq: Once | ORAL | Status: DC | PRN
Start: 2024-02-26 — End: 2024-02-27
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
## 2024-02-27 ENCOUNTER — Observation Stay: Admitting: Certified Registered"

## 2024-02-27 ENCOUNTER — Encounter

## 2024-02-27 ENCOUNTER — Encounter: Payer: Self-pay | Admitting: Obstetrics & Gynecology

## 2024-02-27 LAB — SYPHILIS SCREEN, IGG AND IGM: Syphilis Screen IgG and IgM: NONREACTIVE

## 2024-02-27 MED ORDER — MISOPROSTOL 200 MCG PO TABS
1000.0000 ug | ORAL_TABLET | Freq: Once | ORAL | Status: DC | PRN
Start: 2024-02-27 — End: 2024-02-29

## 2024-02-27 MED ORDER — OXYCODONE HCL 5 MG PO TABS
5.0000 mg | ORAL_TABLET | ORAL | Status: DC | PRN
Start: 2024-02-27 — End: 2024-02-29

## 2024-02-27 MED ORDER — SOD CITRATE-CITRIC ACID 500-334 MG/5ML PO SOLN
30.0000 mL | Freq: Once | ORAL | Status: DC | PRN
Start: 2024-02-27 — End: 2024-02-27

## 2024-02-27 MED ORDER — ACETAMINOPHEN 650 MG RE SUPP
650.0000 mg | RECTAL | Status: DC | PRN
Start: 2024-02-27 — End: 2024-02-29

## 2024-02-27 MED ORDER — HYDROCORTISONE 1 % EX OINT
TOPICAL_OINTMENT | Freq: Three times a day (TID) | CUTANEOUS | Status: DC | PRN
Start: 2024-02-27 — End: 2024-02-29

## 2024-02-27 MED ORDER — METOCLOPRAMIDE HCL 5 MG/ML IJ SOLN
10.0000 mg | Freq: Once | INTRAMUSCULAR | Status: DC | PRN
Start: 2024-02-27 — End: 2024-02-27

## 2024-02-27 MED ORDER — METHYLERGONOVINE MALEATE 0.2 MG PO TABS
0.2000 mg | ORAL_TABLET | Freq: Four times a day (QID) | ORAL | Status: DC | PRN
Start: 2024-02-27 — End: 2024-02-29

## 2024-02-27 MED ORDER — ONDANSETRON HCL 4 MG/2ML IJ SOLN
4.0000 mg | INTRAMUSCULAR | Status: DC | PRN
Start: 2024-02-27 — End: 2024-02-29

## 2024-02-27 MED ORDER — PRENATAL PLUS IRON 29-1 MG PO TABS
1.0000 | ORAL_TABLET | Freq: Every day | ORAL | Status: DC
Start: 2024-02-27 — End: 2024-02-29
  Administered 2024-02-28 – 2024-02-29 (×2): 1 via ORAL
  Filled 2024-02-27 (×2): qty 1

## 2024-02-27 MED ORDER — EPHEDRINE SULFATE 50 MG/ML IJ/IV SOLN (WRAP)
10.0000 mg | Freq: Once | Status: DC | PRN
Start: 2024-02-27 — End: 2024-02-27

## 2024-02-27 MED ORDER — NIFEDIPINE 10 MG PO CAPS
20.0000 mg | ORAL_CAPSULE | Freq: Once | ORAL | Status: DC | PRN
Start: 2024-02-27 — End: 2024-02-29

## 2024-02-27 MED ORDER — SENNOSIDES-DOCUSATE SODIUM 8.6-50 MG PO TABS
1.0000 | ORAL_TABLET | Freq: Every evening | ORAL | Status: DC | PRN
Start: 2024-02-27 — End: 2024-02-29
  Administered 2024-02-28: 1 via ORAL
  Filled 2024-02-27: qty 1

## 2024-02-27 MED ORDER — ALBUTEROL SULFATE (2.5 MG/3ML) 0.083% IN NEBU
2.5000 mg | INHALATION_SOLUTION | Freq: Four times a day (QID) | RESPIRATORY_TRACT | Status: DC | PRN
Start: 2024-02-27 — End: 2024-02-29

## 2024-02-27 MED ORDER — CETIRIZINE HCL 5 MG PO TABS
5.0000 mg | ORAL_TABLET | Freq: Every day | ORAL | Status: DC
Start: 2024-02-27 — End: 2024-02-29
  Filled 2024-02-27 (×3): qty 1

## 2024-02-27 MED ORDER — OXYCODONE HCL 5 MG PO TABS
5.0000 mg | ORAL_TABLET | Freq: Once | ORAL | Status: DC | PRN
Start: 2024-02-27 — End: 2024-02-27

## 2024-02-27 MED ORDER — WITCH HAZEL EX PADS (WRAP)
1.0000 | MEDICATED_PAD | CUTANEOUS | Status: DC | PRN
Start: 2024-02-27 — End: 2024-02-29
  Administered 2024-02-27: 1 via TOPICAL
  Filled 2024-02-27: qty 1

## 2024-02-27 MED ORDER — FAMOTIDINE 10 MG/ML IV SOLN (WRAP)
20.0000 mg | Freq: Once | INTRAVENOUS | Status: DC | PRN
Start: 2024-02-27 — End: 2024-02-27

## 2024-02-27 MED ORDER — METHYLERGONOVINE MALEATE 0.2 MG/ML IJ SOLN
0.2000 mg | Freq: Four times a day (QID) | INTRAMUSCULAR | Status: DC | PRN
Start: 2024-02-27 — End: 2024-02-29

## 2024-02-27 MED ORDER — FLUTICASONE FUROATE-VILANTEROL 100-25 MCG/ACT IN AEPB
1.0000 | INHALATION_SPRAY | Freq: Every morning | RESPIRATORY_TRACT | Status: DC
Start: 2024-02-27 — End: 2024-02-29
  Filled 2024-02-27: qty 14

## 2024-02-27 MED ORDER — BUPIVACAINE HCL (PF) 0.25 % IJ SOLN
INTRAMUSCULAR | Status: AC
Start: 2024-02-27 — End: 2024-02-27
  Filled 2024-02-27: qty 10

## 2024-02-27 MED ORDER — EPHEDRINE SULFATE 50 MG/ML IJ/IV SOLN (WRAP)
Status: AC
Start: 2024-02-27 — End: 2024-02-27
  Filled 2024-02-27: qty 1

## 2024-02-27 MED ORDER — SODIUM CHLORIDE 0.9 % IV SOLN
INTRAVENOUS | Status: DC | PRN
Start: 2024-02-27 — End: 2024-02-27
  Administered 2024-02-27 (×2): 5 mL via EPIDURAL

## 2024-02-27 MED ORDER — EPHEDRINE SULFATE 50 MG/ML IJ/IV SOLN (WRAP)
10.0000 mg | Status: DC | PRN
Start: 2024-02-27 — End: 2024-02-27

## 2024-02-27 MED ORDER — FENTANYL CITRATE (PF) 50 MCG/ML IJ SOLN (WRAP)
INTRAMUSCULAR | Status: AC
Start: 2024-02-27 — End: 2024-02-27
  Filled 2024-02-27: qty 2

## 2024-02-27 MED ORDER — TETANUS-DIPHTH-ACELL PERTUSSIS (BOOSTRIX) IM SUSP/SUSY (WRAP)
0.5000 mL | INTRAMUSCULAR | Status: DC | PRN
Start: 2024-02-27 — End: 2024-02-29

## 2024-02-27 MED ORDER — IBUPROFEN 600 MG PO TABS
600.0000 mg | ORAL_TABLET | Freq: Four times a day (QID) | ORAL | Status: DC
Start: 2024-02-27 — End: 2024-02-28
  Administered 2024-02-27 – 2024-02-28 (×3): 600 mg via ORAL
  Filled 2024-02-27 (×3): qty 1

## 2024-02-27 MED ORDER — BUPIVACAINE HCL (PF) 0.25 % IJ SOLN
30.0000 mL | Freq: Once | INTRAMUSCULAR | Status: DC
Start: 2024-02-27 — End: 2024-02-27

## 2024-02-27 MED ORDER — NIFEDIPINE 10 MG PO CAPS
10.0000 mg | ORAL_CAPSULE | Freq: Once | ORAL | Status: DC | PRN
Start: 2024-02-27 — End: 2024-02-29

## 2024-02-27 MED ORDER — BENZOCAINE 20% +/- MENTHOL 0.5% EX AERO (WRAP)
1.0000 | INHALATION_SPRAY | CUTANEOUS | Status: DC | PRN
Start: 2024-02-27 — End: 2024-02-29
  Administered 2024-02-27: 1 via TOPICAL
  Filled 2024-02-27: qty 56

## 2024-02-27 MED ORDER — MEASLES, MUMPS & RUBELLA VAC IJ SOLR
0.5000 mL | INTRAMUSCULAR | Status: DC | PRN
Start: 2024-02-27 — End: 2024-02-29

## 2024-02-27 MED ORDER — OXYTOCIN-SODIUM CHLORIDE 30-0.9 UT/500ML-% IV SOLN
2.0000 m[IU]/min | INTRAVENOUS | Status: DC
Start: 2024-02-27 — End: 2024-02-27
  Administered 2024-02-27: 2 m[IU]/min via INTRAVENOUS
  Filled 2024-02-27: qty 500

## 2024-02-27 MED ORDER — BUPIVACAINE HCL (PF) 0.5 % IJ SOLN
30.0000 mL | Freq: Once | INTRAMUSCULAR | Status: DC
Start: 2024-02-27 — End: 2024-02-27

## 2024-02-27 MED ORDER — ALBUTEROL SULFATE (2.5 MG/3ML) 0.083% IN NEBU
2.5000 mg | INHALATION_SOLUTION | Freq: Four times a day (QID) | RESPIRATORY_TRACT | Status: DC | PRN
Start: 2024-02-27 — End: 2024-02-28

## 2024-02-27 MED ORDER — FENTANYL-BUPIVACAINE-NACL 0.2-0.125-0.9 MG/100ML-% EP SOLN
EPIDURAL | Status: DC
Start: 2024-02-27 — End: 2024-02-27

## 2024-02-27 MED ORDER — FENTANYL-BUPIVACAINE-NACL 0.2-0.125-0.9 MG/100ML-% EP SOLN
EPIDURAL | Status: AC
Start: 2024-02-27 — End: 2024-02-27
  Filled 2024-02-27: qty 100

## 2024-02-27 MED ORDER — MAGNESIUM HYDROXIDE 400 MG/5ML PO SUSP
30.0000 mL | Freq: Four times a day (QID) | ORAL | Status: DC | PRN
Start: 2024-02-27 — End: 2024-02-29
  Administered 2024-02-28: 30 mL via ORAL
  Filled 2024-02-27: qty 30

## 2024-02-27 MED ORDER — MONTELUKAST SODIUM 10 MG PO TABS
10.0000 mg | ORAL_TABLET | Freq: Every evening | ORAL | Status: DC
Start: 2024-02-27 — End: 2024-02-29
  Filled 2024-02-27 (×3): qty 1

## 2024-02-27 MED ORDER — NALOXONE HCL 0.4 MG/ML IJ SOLN (WRAP)
0.1000 mg | INTRAMUSCULAR | Status: DC | PRN
Start: 2024-02-27 — End: 2024-02-27

## 2024-02-27 MED ORDER — LACTATED RINGERS IV BOLUS
1000.0000 mL | Freq: Once | INTRAVENOUS | Status: DC
Start: 2024-02-27 — End: 2024-02-27
  Filled 2024-02-27: qty 1000

## 2024-02-27 MED ORDER — LANOLIN EX CREAM (WRAP)
TOPICAL_CREAM | CUTANEOUS | Status: DC | PRN
Start: 2024-02-27 — End: 2024-02-29

## 2024-02-27 MED ORDER — FENTANYL-BUPIVACAINE-NACL 0.2-0.125-0.9 MG/100ML-% EP SOLN
EPIDURAL | Status: DC
Start: 2024-02-27 — End: 2024-02-27
  Filled 2024-02-27: qty 100

## 2024-02-27 MED ORDER — ACETAMINOPHEN 500 MG PO TABS
500.0000 mg | ORAL_TABLET | ORAL | Status: DC | PRN
Start: 2024-02-27 — End: 2024-02-29
  Administered 2024-02-27 – 2024-02-28 (×2): 500 mg via ORAL
  Filled 2024-02-27 (×2): qty 1

## 2024-02-27 MED ORDER — IBUPROFEN 600 MG PO TABS
600.0000 mg | ORAL_TABLET | Freq: Once | ORAL | Status: AC | PRN
Start: 2024-02-27 — End: 2024-02-27
  Administered 2024-02-27: 600 mg via ORAL
  Filled 2024-02-27: qty 1

## 2024-02-27 MED ORDER — FENTANYL CITRATE (PF) 50 MCG/ML IJ SOLN (WRAP)
100.0000 ug | Freq: Once | INTRAMUSCULAR | Status: DC
Start: 2024-02-27 — End: 2024-02-27

## 2024-02-27 MED ORDER — FENTANYL-BUPIVACAINE-NACL 0.2-0.125-0.9 MG/100ML-% EP SOLN
EPIDURAL | Status: DC | PRN
Start: 2024-02-27 — End: 2024-02-27
  Administered 2024-02-27: 12 mL/h via EPIDURAL

## 2024-02-27 MED ORDER — BISACODYL 10 MG RE SUPP
10.0000 mg | Freq: Every day | RECTAL | Status: DC | PRN
Start: 2024-02-27 — End: 2024-02-28

## 2024-02-27 NOTE — UM Notes (Signed)
 Catherine Spence  09-29-1990  FMW:[id number]    02/26/24 2216  ADMIT TO OBSERVATION (OUTPATIENT WITH OBSERVATION SERVICES)  Once          02/27/24 1446  ADMIT TO INPATIENT  Once        Diagnosis: [redacted] Weeks Gestation Of Pregnancy  Level of Care: Level 4 (Acute)  Patient Class: Inpatient   References:    Bedside Procedures    Levels of Care Summary Table    Levels of Care Observation Unit Placement    Inpatient Respiratory Guidelines    [place] Respiratory Guidelines    New Service Definitions Feb 2023    Device and Therapies   Question Answer Comment   Admitting Physician KATHRIN, ROOPA M    Service: OB Post Partum    Estimated Length of Stay > or = to 2 midnights    Tentative Discharge Plan? Home or Self Care                33 y.o. female  admitted on 02/26/2024    Catherine Spence is a 33 y.o. H2E5975, female with Estimated Date of Delivery: 03/26/24 at [redacted]w[redacted]d weeks gestation who presents from home for IOL d/t PFT 50%.   Patient reported no contractions, no leaking or LOF, and good fetal movement.         Delivery Report  Delivery Date:  02/27/2024  Delivery Time:  12:16 PM  Delivery Type: Vaginal, Spontaneous     GA: [redacted]w[redacted]d  Sex: female   Wt.: 5 lb 9.2 oz (2530 g)   APGARS:  6 and 8.   Now H2E5874    02/27/2024 24hr  Temp:  [98 F (36.7 C)-98.4 F (36.9 C)] 98.2 F (36.8 C)  Heart Rate:  [72-99] 90  Resp Rate:  [16-18] 16  BP: (85-132)/(38-78) 107/60      Scheduled Meds:Current Facility-Administered Medications[1]  Continuous Infusions:  PRN Meds:.acetaminophen  **OR** acetaminophen , albuterol , albuterol , benzocaine -lanolin-aloe vera, bisacodyl , hydrocortisone , lanolin, magnesium  hydroxide, measles, mumps & rubella vaccine, methylergonovine **OR** methylergonovine, miSOPROStol , NIFEdipine , NIFEdipine , ondansetron , oxyCODONE , senna-docusate, TdaP Booster, witch hazel    This clinical review is based on compiled documentation provided by the treatment team within the patient's medical record.    UTILIZATION  REVIEW CONTACT :   Catherine Molt, MSN, ACM-RN  Utilization Review RN ll   Finance/Revenue Cycle Department, Utilization Review   UR Dept O [phone]  UR Dept F [phone]  Office: [phone] (vm only)  C: [phone] (8a-5p)  [email or web address]       Tax ID: [id number]  [facility]   (IAH) [facility]   (IFH) [facility]   [facility]) [facility]   [facility]) [facility]  [facility])   [address]  [place], [place] [address] [address]  [address] [place], [place] [address] [address]  [place], [place] [address] [phone] [facility].  [place], [place] [address] [address]  [place], [place] [address]   NPI: [id number] NPI: [id number] NPI: [id number] NPI: [id number] NPI: [id number]                  [1]   Current Facility-Administered Medications   Medication Dose Route Frequency    cetirizine   5 mg Oral Daily    fluticasone  furoate-vilanterol  1 puff Inhalation QAM    ibuprofen   600 mg Oral Q6H    montelukast   10 mg Oral QHS    prenatal vitamin  1 tablet Oral Daily

## 2024-02-27 NOTE — Progress Notes (Signed)
 Pt and infant transported to postpartum Room 4216 via stretcher. Care relinquished to [facility].

## 2024-02-27 NOTE — Plan of Care (Signed)
Problem: Vaginal/Cesarean Delivery  Goal: Maternal Status within defined parameters  Outcome: Completed  Goal: Evidence of Fetal Well Being  Outcome: Completed  Goal: Free from Maternal/Fetal Infection  Outcome: Completed  Goal: Intrapartum management of pain/discomfort  Outcome: Completed

## 2024-02-27 NOTE — Progress Notes (Signed)
 OB Progress Note      Date Time: 02/27/2024 9:42 AM  Patient Name: Catherine Spence, Catherine Spence  Room#: W7791/W7791.A      Subjective:   Patient resting comfortably in bed, reports feeling comfortable with no pain after epidural placement.      Objective:     Patient Vitals for the past 72 hrs (Last 3 readings):   BP Pulse   02/27/24 0930 132/72 86   02/27/24 0915 124/75 84   02/27/24 0900 117/74 89      98 F (36.7 C) (02/27/2024  8:15 AM)     Cervical Exam:  Dilation: 3 (02/27/24 0935)  Effacement (%): 60 (02/27/24 0935)  Fetal Station: -1 (02/27/24 0935)  OB Examiner: Glo Holland CNM (02/27/24 0935)  Cephalic  Bishop Score:            Document Bishop Score    FHT:  Baseline Rate: 120 BPM  Variability: moderate  Accelerations: present  Decelerations: none   Uterine Activity:  Mode: Toco  Contraction Frequency: 2-4.5  Contraction Duration: 60-120  Contraction Quality: Mild  Resting Tone Palpated: Soft Membranes:  Membrane Status: Artificial  Rupture Date: 02/27/24  Rupture Time: 0936  Color: Clear      Medication: Pitocin  @ 6    Pain meds: Epidural in place     GBS Negative       Assessment:   Catherine Spence is a 33 y.o. female H2E5975 at [redacted]w[redacted]d, Estimated Date of Delivery: 03/26/24   1. IOL for pulmonary function test 50%  2. Clear fluid  3. FHR Category: Category 1  4. VSS      Plan:   AROM per CNM @ 0930.  Continue Pitocin  per protocol.  Continuous EFM.  Encouraged frequent position changes for optimal fetal presentation and to facilitate fetal descent.   Peanut ball.  Reassess in 2 hrs/prn.    Risks, benefits, alternatives and possible complications have been discussed in detail with the patient. Plan of care discussed with patient and family, patient in agreement, questions answered and addressed.     Signed by: Kelly Dais, CNM 02/27/2024        OB

## 2024-02-27 NOTE — Plan of Care (Signed)
Problem: Vaginal/Cesarean Delivery  Goal: Maternal Status within defined parameters  Outcome: Progressing  Goal: Evidence of Fetal Well Being  Outcome: Progressing  Goal: Free from Maternal/Fetal Infection  Outcome: Progressing  Goal: Intrapartum management of pain/discomfort  Outcome: Progressing     Problem: Pain interferes with ability to perform ADL  Goal: Pain at adequate level as identified by patient  Outcome: Progressing     Problem: Side Effects from Pain Analgesia  Goal: Patient will experience minimal side effects of analgesic therapy  Outcome: Progressing

## 2024-02-27 NOTE — Anesthesia Preprocedure Evaluation (Signed)
 Anesthesia Evaluation    AIRWAY    Mallampati: III    TM distance: >3 FB  Neck ROM: full  Mouth Opening:full  Planned to use difficult airway equipment: No CARDIOVASCULAR    cardiovascular exam normal       DENTAL    no notable dental hx               PULMONARY         OTHER FINDINGS    Severe asthma, albuterol  5x per day, chronic cough, heavy smoking  Morbid obesity                                    Relevant Problems   CARDIO   (+) Hypertension affecting pregnancy in third trimester               Anesthesia Plan    ASA 3     epidural                     Detailed anesthesia plan: epidural            informed consent obtained      pertinent labs reviewed               Signed by: Juliene JONELLE Glatter, CRNA 02/27/2024 6:49 AM

## 2024-02-27 NOTE — Anesthesia Procedure Notes (Signed)
 Epidural    Patient location during procedure: L&D  Reason for block: Labor or C-section  Block at Surgeon's request: Yes    Start time: 02/27/2024 6:20 AM  End time: 02/27/2024 6:34 AM    Staffing  Performed: resident/CRNA   Authorized by: Jerri Eva, MD    Performed by: Merilee Juliene SAUNDERS, CRNA    Pre-procedure Checklist   Completed: patient identified, site marked, surgical consent, pre-op evaluation, timeout performed, risks and benefits discussed, monitors and equipment checked, anesthesia consent given and correct site    EPIDURAL      Patient monitoring: Pulse oximetry and NIBP    Premedication: Meaningful Contact Maintained    Patient position: sitting    Skin Local: lidocaine  1%      Dose: 3 mL    CSF Return: No  Blood Return: No    Number of attempts: 2      Epidural Space ID: 8 cm  Paresthesia Pain: No     Attempt 1  Interspace: L3-4     Approach: midline            Successful attempt  Interspace: L2-3    Approach: midline      Injection technique: LOR saline    Epidural Needle Placement    Catheter type: side hole  Catheter size: 20 G  Catheter at skin depth (cm): 14  Needle gauge: 17  Paresthesia Pain: No  CSF Return: CSF aspirated   Blood Return:blood aspirated  CSF Return: No  Blood Return: No  Incremental injection: yes  Injection made incrementally with aspirations every 5 mL.  No Catheter IV/SA Signs or Symptoms  Injection technique: LOR saline  CSF Return: No  Blood Return: No    Assessment   Sensory level: T12  Patient tolerated procedure well: Yes  Block Outcome: no complications and successful block    Additional Notes  Difficult placement, unable to palpate spinous processes D/T habitus

## 2024-02-27 NOTE — Plan of Care (Signed)
 Introduced myself to patient & spouse. Updated and reviewed white board with patient. Plan of care for shift focused on calling for help the first two times getting out of bed, measuring two voids, feeding baby every 2-3 hours, pain management, rest and hydration.   Problem: Pain interferes with ability to perform ADL  Goal: Pain at adequate level as identified by patient  Outcome: Progressing     Problem: Side Effects from Pain Analgesia  Goal: Patient will experience minimal side effects of analgesic therapy  Outcome: Progressing     Problem: Vaginal/Cesarean Delivery  Goal: Postpartum management of pain/discomfort  Outcome: Progressing  Goal: Breasts are soft with nipple integrity intact  Outcome: Progressing  Goal: Gastrointestinal/Urinary management  Outcome: Progressing  Goal: Uterine management  Outcome: Progressing  Goal: Perineum will be clean, dry, and intact and without discharge or hematoma  Outcome: Progressing  Goal: VTE Prevention  Outcome: Progressing  Goal: Evidence of positive mother-baby interactions  Outcome: Progressing
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
## 2024-02-27 NOTE — Plan of Care (Signed)
Problem: Vaginal/Cesarean Delivery  Goal: Maternal Status within defined parameters  Outcome: Progressing    Goal: Evidence of Fetal Well Being  Outcome: Progressing    Goal: Free from Maternal/Fetal Infection  Outcome: Progressing    Goal: Intrapartum management of pain/discomfort  Outcome: Progressing

## 2024-02-27 NOTE — Progress Notes (Signed)
 OB Progress Note      Date Time: 02/27/2024 8:22 AM  Patient Name: Catherine Spence, Catherine Spence  Room#: W7791/W7791.A      Subjective:   Pt resting comfortable with epidural.       Objective:     Patient Vitals for the past 72 hrs (Last 3 readings):   BP Pulse   02/27/24 0815 (!) 95/51 81   02/27/24 0800 93/55 82   02/27/24 0745 (!) 92/45 86      98 F (36.7 C) (02/27/2024  8:15 AM)     Cervical Exam: deferred   Dilation: 3 (02/27/24 0655)  Effacement (%): 60 (02/27/24 0655)  Fetal Station: -3 (02/27/24 0655)  OB Examiner: hana s (02/27/24 0655)  Cephalic  Bishop Score:            Document Bishop Score    FHT:  Baseline Rate: 130 BPM  Variability: moderate  Accelerations: present  Decelerations: none   Uterine Activity:  Mode: Toco  Contraction Frequency: 2-6  Contraction Duration: 60-100  Contraction Quality: Mild  Resting Tone Palpated: Soft Membranes:  Membrane Status: Intact  Rupture Date:    Rupture Time:    Color:        Medication: Pitocin  @ 4    Pain meds: Epidural in place     No results found for: GBS       Assessment:   Catherine Spence is a 33 y.o. female [id number] at [redacted]w[redacted]d, Estimated Date of Delivery: 03/26/24   1. IOL for PFT 50%, early labor  2. Membranes intact  3. FHR Category: Category 1  4. VSS      Plan:   Begin Pitocin  per protocol.  Continuous EFM.  Encouraged frequent position changes for optimal fetal presentation and to facilitate fetal descent.   Peanut ball.  Anticipate NSVB.  Reassess in 4-5 hrs/prn.    Risks, benefits, alternatives and possible complications have been discussed in detail with the patient. Plan of care discussed with patient and husband, patient in agreement, questions answered and addressed.     Signed by: [name], CNM 02/27/2024        OB

## 2024-02-27 NOTE — Progress Notes (Signed)
 Introduced myself to patient & support person. Received report from L&D nurse Nat, RN. Matched arm bands for patient, baby, and support person. Reviewed white board. Plan of care for shift focused on calling for help the first two times getting out of bed, measuring two voids, feeding baby every 2-3 hours, pain management, rest and hydration. Discussed current pain and pain goal. Oriented patient & support person to room and reviewed safety measures for patient and baby. Instructed how and when to call with questions or concerns.

## 2024-02-28 ENCOUNTER — Other Ambulatory Visit: Payer: Self-pay

## 2024-02-28 MED ORDER — POLYETHYLENE GLYCOL 3350 17 G PO PACK
17.0000 g | PACK | Freq: Every day | ORAL | 3 refills | Status: DC
Start: 2024-02-28 — End: 2024-02-28

## 2024-02-28 MED ORDER — LIDOCAINE 5 % EX PTCH
1.0000 | MEDICATED_PATCH | CUTANEOUS | Status: DC
Start: 2024-02-28 — End: 2024-02-29
  Administered 2024-02-28 – 2024-02-29 (×2): 1 via TRANSDERMAL
  Filled 2024-02-28 (×3): qty 1

## 2024-02-28 MED ORDER — POLYETHYLENE GLYCOL 3350 17 G PO PACK
17.0000 g | PACK | Freq: Every day | ORAL | Status: DC
Start: 2024-02-28 — End: 2024-02-29
  Administered 2024-02-28 – 2024-02-29 (×2): 17 g via ORAL
  Filled 2024-02-28 (×2): qty 1

## 2024-02-28 MED ORDER — DSS 100 MG PO CAPS
200.0000 mg | ORAL_CAPSULE | Freq: Two times a day (BID) | ORAL | 3 refills | Status: DC
Start: 2024-02-28 — End: 2024-02-28

## 2024-02-28 MED ORDER — TRAMADOL HCL 50 MG PO TABS
50.0000 mg | ORAL_TABLET | Freq: Four times a day (QID) | ORAL | 0 refills | Status: AC | PRN
Start: 2024-02-28 — End: 2024-03-09
  Filled 2024-02-28: qty 30, 10d supply, fill #0

## 2024-02-28 MED ORDER — LIDOCAINE 5 % EX PTCH
1.0000 | MEDICATED_PATCH | CUTANEOUS | 1 refills | Status: DC
Start: 2024-02-28 — End: 2024-02-28

## 2024-02-28 MED ORDER — DSS 100 MG PO CAPS
200.0000 mg | ORAL_CAPSULE | Freq: Two times a day (BID) | ORAL | 3 refills | Status: AC
Start: 2024-02-28 — End: ?
  Filled 2024-02-28: qty 120, 30d supply, fill #0

## 2024-02-28 MED ORDER — DOCUSATE SODIUM 100 MG PO CAPS
200.0000 mg | ORAL_CAPSULE | Freq: Two times a day (BID) | ORAL | Status: DC
Start: 2024-02-28 — End: 2024-02-29
  Administered 2024-02-28 – 2024-02-29 (×3): 200 mg via ORAL
  Filled 2024-02-28: qty 2
  Filled 2024-02-28: qty 1
  Filled 2024-02-28 (×2): qty 2

## 2024-02-28 MED ORDER — IBUPROFEN 800 MG PO TABS
800.0000 mg | ORAL_TABLET | Freq: Three times a day (TID) | ORAL | 2 refills | Status: DC
Start: 2024-02-28 — End: 2024-02-28

## 2024-02-28 MED ORDER — TRAMADOL HCL 50 MG PO TABS
50.0000 mg | ORAL_TABLET | Freq: Four times a day (QID) | ORAL | 0 refills | Status: DC | PRN
Start: 2024-02-28 — End: 2024-02-28

## 2024-02-28 MED ORDER — POLYETHYLENE GLYCOL 3350 17 G PO PACK
17.0000 g | PACK | Freq: Every day | ORAL | 3 refills | Status: AC
Start: 2024-02-28 — End: ?
  Filled 2024-02-28: qty 30, 30d supply, fill #0

## 2024-02-28 MED ORDER — IBUPROFEN 400 MG PO TABS
800.0000 mg | ORAL_TABLET | Freq: Three times a day (TID) | ORAL | Status: DC | PRN
Start: 2024-02-28 — End: 2024-02-28

## 2024-02-28 MED ORDER — TRAMADOL HCL 50 MG PO TABS
50.0000 mg | ORAL_TABLET | Freq: Four times a day (QID) | ORAL | Status: DC | PRN
Start: 2024-02-28 — End: 2024-02-29
  Filled 2024-02-28: qty 1

## 2024-02-28 MED ORDER — IBUPROFEN 800 MG PO TABS
800.0000 mg | ORAL_TABLET | Freq: Three times a day (TID) | ORAL | 2 refills | Status: AC
Start: 2024-02-28 — End: ?
  Filled 2024-02-28: qty 60, 20d supply, fill #0

## 2024-02-28 MED ORDER — IBUPROFEN 400 MG PO TABS
800.0000 mg | ORAL_TABLET | Freq: Three times a day (TID) | ORAL | Status: DC
Start: 2024-02-28 — End: 2024-03-01
  Administered 2024-02-28 – 2024-02-29 (×3): 800 mg via ORAL
  Filled 2024-02-28 (×3): qty 2

## 2024-02-28 MED ORDER — BISACODYL 10 MG RE SUPP
10.0000 mg | Freq: Every day | RECTAL | Status: DC | PRN
Start: 2024-02-28 — End: 2024-02-29

## 2024-02-28 MED ORDER — LIDOCAINE 5 % EX PTCH
1.0000 | MEDICATED_PATCH | CUTANEOUS | 1 refills | Status: AC
Start: 2024-02-28 — End: ?
  Filled 2024-02-28: qty 10, 10d supply, fill #0

## 2024-02-28 NOTE — Plan of Care (Addendum)
 Assessment completed with patient consent. Assessment within normal limits. Patient is stable and ambulating independently. Patient is voiding adequately. Patient is formula feeding only and rooming in with baby. Patient's pain has been well controlled with medication. Patient has been educated on falls prevention/safety and feeding cues. POC reviewed with patient. Whiteboard updated.     Patient would like an early discharge.    Problem: Vaginal/Cesarean Delivery  Goal: Postpartum management of pain/discomfort  Outcome: Progressing  Goal: Breasts are soft with nipple integrity intact  Outcome: Progressing  Goal: Gastrointestinal/Urinary management  Outcome: Progressing  Goal: Uterine management  Outcome: Progressing  Goal: Perineum will be clean, dry, and intact and without discharge or hematoma  Outcome: Progressing  Goal: VTE Prevention  Outcome: Progressing  Goal: Evidence of positive mother-baby interactions  Outcome: Progressing
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
## 2024-02-28 NOTE — Plan of Care (Signed)
 Introduced myself to patient & spouse. Updated and reviewed white board with patient. Plan of care for shift focused on preparation for discharge and pain control. Pt accepts discharge education.     Problem: Pain interferes with ability to perform ADL  Goal: Pain at adequate level as identified by patient  Outcome: Progressing     Problem: Side Effects from Pain Analgesia  Goal: Patient will experience minimal side effects of analgesic therapy  Outcome: Progressing     Problem: Vaginal/Cesarean Delivery  Goal: Postpartum management of pain/discomfort  Outcome: Progressing  Goal: Breasts are soft with nipple integrity intact  Outcome: Progressing  Goal: Gastrointestinal/Urinary management  Outcome: Progressing  Goal: Uterine management  Outcome: Progressing  Goal: Perineum will be clean, dry, and intact and without discharge or hematoma  Outcome: Progressing  Goal: VTE Prevention  Outcome: Progressing  Goal: Evidence of positive mother-baby interactions  Outcome: Progressing     Problem: Moderate/High Fall Risk Score >5  Goal: Patient will remain free of falls  Outcome: Progressing

## 2024-02-28 NOTE — Discharge Summary (Signed)
 Vaginal Delivery Discharge Note    [facility]  [facility]    Date Time: 02/28/2024 10:58 PM  Patient Name: Catherine Spence, Catherine Spence  Room#: W5783/W5783.A    Delivery Type: Normal Spontaneous Vaginal Delivery (NSVD)   Birth Date: 02/27/2024   Delivery Time: 12:16 PM    Catherine Spence        Subjective:   Catherine Spence is a 33 y.o. [phone] doing well.  Tolerating PO, Passing gas, and Has not had BM. Ambulating without difficulty.  Pain controlled with meds. Minimal bleeding. Ready for discharge    Baby female- doing well, followed by peds, Breastfeeding and Formula feeding.     Has good support system:   [x]  YES  []  NO  History of depression, panic attacks or anxiety:   [x]  YES  []  NO      Objective:   Vital Signs Stable:   Temp:  [98.2 F (36.8 C)-99.5 F (37.5 C)] 99.5 F (37.5 C)  Heart Rate:  [74-79] 74  Resp Rate:  [16-18] 18  BP: (94-106)/(62-67) 106/67    No acute distress  Breast soft, non tender  Fundus firm, non tender, below umbilicus  Lochia decreasing  Extremities WNL  Abdomen soft, appropriately tender  Perineum healing    Labs:       Assessment:   Stable - Postpartum Day 1, doing well         Plan:   2 weeks for mood check (can be virtual)  6 weeks for postpartum check  Continue PNV, Tylenol , Motrin , and Tramadol    Written and verbal instructions given for incision care, warning signs, and what to do at home  Discharge home tomorrow AM if vital signs stable and no change in patient condition.      Brief IP summary: G7P4 at 36w IOL for maternal PFTs 50%. Baby being followed d/t preterm and is pending car seat challenge in AM. And then they will be able to go home.     Pt wants to start Lexapro , discussed R/B, Rx placed. F/U with mood check 2w.     Signed by: Catherine Spence, CNM 02/28/2024      OB

## 2024-02-28 NOTE — Discharge Instr - AVS First Page (Addendum)
 Vaginal Birth Discharge Instructions    [facility]  [facility]    Follow up appointments:    2 weeks for mood check (can be virtual)  6 weeks for postpartum check      Call if you experience any of the following:    Heavy or prolonged vaginal bleeding (soaking a maxi pad every hour or passing clots larger than your fist).  Your temperature is above 100 degrees F or if you have severe chills.  Foul smelling vaginal discharge or difficulty urinating.  Severe cramps and your pain is not relieved by medication, hot shower, rest, hydration.  Breasts reddened, hard, hot to the touch.  Nipple discharge which is foul-smelling or contains pus.  Severe headache, blurry vision, or epigastric pain.  New calf pain especially if only on one side, or redness/swelling on one leg  Any signs or symptoms of depression or anxiety: inability to cope, crying every day, feeling overwhelmed, insomnia, lack of interest in baby, or thoughts of harming yourself or others.  You have nausea or vomiting for more than 24 hours after surgery.  There is excessive bright red bleeding or drainage from your incision site or dressing.  You have any other questions or problems related to your surgery.      What to do at home:    See patient education handouts for full information.  Take care of yourself by sleeping/resting as much as possible.  Let someone else care for you, your baby, and housework as much as possible.    Eat regular nutritious meals, including healthy fats, protein, and fiber. Drink lots of water , at least one gallon a day. Increase fiber in your diet.  Avoid strenuous activity for two weeks and don't lift anything heavier than the baby. Resume activity gradually.  Menstrual-like bleeding and spotting is very common in the first few weeks.  If you are still having vaginal bleeding at 1 month after birth, if you ever pass a clot larger than your fist, and if your bleeding ever has a bad odor,  please call our office.  Nothing in the vagina for 6 weeks.  Avoid intercourse, douches, or tampons until follow up postpartum visit in 6 weeks.  Breast care: Wear support bra 24/7; use Lanolin as needed.  Call if signs or symptoms of mastitis: fever, chills, headache, breast lump or bump, tenderness, or red streaks.  Refer to Newborn Discharge Instructions for any concerns      Medications:    Do not take pain medication on an empty stomach, since it may cause nausea or vomiting.  Eat small and frequent meals.  Continue to take prenatal vitamins as long as you are breastfeeding  If not already taking, please take Vitamin D3 5,000 units every day for bone health  You are anemic after giving birth. Once you are having normal bowel movements, start taking an over-the-counter iron  supplement, like Vitron C. This may cause constipation, for which you can take stool softners like Colace.     Current Discharge Medication List        START taking these medications    Details   docusate sodium  (COLACE) 100 MG capsule Take 2 capsules (200 mg) by mouth 2 (two) times daily  Qty: 120 capsule, Refills: 3      polyethylene glycol (MIRALAX ) 17 g packet Take 17 g by mouth once daily  Qty: 90 each, Refills: 3      traMADol  (ULTRAM ) 50 MG  tablet Take 1 tablet (50 mg) by mouth every 6 (six) hours as needed for Pain  Qty: 30 tablet, Refills: 0    Associated Diagnoses: Perineal laceration during delivery, postpartum condition           CONTINUE these medications which have CHANGED    Details   ibuprofen  (ADVIL ) 800 MG tablet Take 1 tablet (800 mg) by mouth every 8 (eight) hours  Qty: 60 tablet, Refills: 2      lidocaine  (LIDODERM ) 5 % Place 1 patch onto the skin once every 24 hours Remove & Discard patch within 12 hours or as directed by MD  Qty: 10 patch, Refills: 1           CONTINUE these medications which have NOT CHANGED    Details   acetaminophen  (TYLENOL ) 650 MG CR tablet Take 500 mg by mouth every 8 (eight) hours as needed for  Pain      Advair  Diskus 500-50 MCG/ACT Aerosol Pwdr, Breath Activated       albuterol  (PROVENTIL ) (2.5 MG/3ML) 0.083% nebulizer solution Take 3 mLs (2.5 mg) by nebulization every 4 (four) hours as needed for Wheezing or Shortness of Breath (coughing)  Qty: 30 mL, Refills: 0      albuterol  sulfate HFA (PROVENTIL ) 108 (90 Base) MCG/ACT inhaler Inhale 2 puffs into the lungs every 4 (four) hours as needed      cetirizine  (ZyrTEC ) 10 MG chewable tablet Chew 1 tablet (10 mg) by mouth once daily      EPINEPHrine  0.3 MG/0.3ML Solution Auto-injector injection Inject 0.3 mLs (0.3 mg total) into the muscle as needed (for Anaphylaxis)  Qty: 2 each, Refills: 0      Nebulizer Misc Dispense one nebulizer machine to include mask and tubing  Qty: 1 each, Refills: 0           STOP taking these medications       famotidine  (PEPCID ) 40 MG tablet        fluticasone -salmeterol (ADVAIR  DISKUS) 100-50 MCG/ACT Aerosol Pwdr, Breath Activated        metoclopramide  (REGLAN ) 10 MG tablet        benzonatate  (TESSALON ) 100 MG capsule        butalbital -acetaminophen -caffeine  (FIORICET) 50-325-40 MG per tablet        clindamycin  (CLEOCIN ) 300 MG capsule        cyclobenzaprine  (FLEXERIL ) 10 MG tablet        montelukast  (SINGULAIR ) 10 MG tablet        ondansetron  (ZOFRAN -ODT) 4 MG disintegrating tablet        pantoprazole (PROTONIX) 40 MG tablet        pramoxine (ANUSOL , PROCTOFOAM) 1 % foam

## 2024-02-28 NOTE — Anesthesia Postprocedure Evaluation (Signed)
 Anesthesia Postpartum Evaluation (Vaginal Delivery)    Catherine Spence seen and examined. Anesthesia record reviewed. No anesthesia-related complications.    Elements of Post-Anesthesia Evaluation:  Vitals: BP 94/62   Pulse 79   Temp 36.8 C (98.2 F) (Oral)   Resp 16   Ht 1.676 m (5' 6)   Wt 127 kg (280 lb)   LMP 06/20/2023 (Exact Date)   SpO2 98%   Breastfeeding Unknown   BMI 45.19 kg/m   Level of Consciousness: awake and alert  Pain Management: adequate  Airway Patency: patent  Anesthetic complications: No    PONV Status: none  Cardiovascular status: acceptable  Respiratory status: acceptable  Hydration status: acceptable    Assessment and Plan:  Status post neuraxial analgesic for labor and delivery, with no assessed or reported anesthetic complications.  All questions and concerns addressed. Signing off; reconsult PRN.

## 2024-02-28 NOTE — Progress Notes (Signed)
Am assessment done, alert and verbally responsive, no sob noted,fundus is firm, scant  amount of lochia rubra noted, white board updated, plan of care reviewed. Given opportunities to ask questions, baby in nursery,  will continue to monitor.

## 2024-02-28 NOTE — Plan of Care (Signed)
 Problem: Vaginal/Cesarean Delivery  Goal: Postpartum management of pain/discomfort  Outcome: Progressing  Goal: Breasts are soft with nipple integrity intact  Outcome: Progressing  Goal: Gastrointestinal/Urinary management  Outcome: Progressing  Goal: Uterine management  Outcome: Progressing  Goal: Perineum will be clean, dry, and intact and without discharge or hematoma  Outcome: Progressing  Goal: VTE Prevention  Outcome: Progressing  Goal: Evidence of positive mother-baby interactions  Outcome: Progressing     Problem: Pain interferes with ability to perform ADL  Goal: Pain at adequate level as identified by patient  Outcome: Progressing     Problem: Side Effects from Pain Analgesia  Goal: Patient will experience minimal side effects of analgesic therapy  Outcome: Progressing

## 2024-02-28 NOTE — Progress Notes (Signed)
 PP Progress Note    [facility]  [facility]      Date Time: 02/28/2024 10:32 AM  Patient Name: Catherine Spence, Catherine Spence  Room#: W5783/W5783.A    Delivery Type: Normal Spontaneous Vaginal Delivery (NSVD)   Birth Date: 02/27/2024   Delivery Time: 12:16 PM       Subjective:   Catherine Spence is a 33 y.o. [id number] doing well.  Tolerating PO.  Ambulating without difficulty.  Pain controlled with  meds. Minimal bleeding.    Baby female- rooming in, but followed by peds d/t Hypoglycemia , Formula feeding.     Objective:   Vital Signs Stable:   Vitals:    02/28/24 0413   BP: 94/62   Pulse: 79   Resp: 16   Temp: 98.2 F (36.8 C)   SpO2: 98%       No acute distress  Breast soft, non tender  Fundus firm, non tender, below umbilicus  Lochia decreasing  Extremities WNL  Perineum healing    Labs:          Assessment:   Postpartum Day 1, doing well overall but having a lot of lower abdominal cramping, bottom is sore and her back where epidural was is also painful.  She desires lidoderm  patch, ordered, discussed pain management options, desires tramadol  and increasing motrin  to 800 mg q 8 hours, she also feels constipated      Plan:   Routine post-partum care  Continue Tylenol , Motrin , Colace, and add miralax , 800 mg motrin  TID, tramadol  prn and lidoderm  patch for her back    Remove PIV  For discharge tomorrow    Signed by: Roderick CHRISTELLA Haskell, CNM 02/28/2024      OB

## 2024-02-29 ENCOUNTER — Other Ambulatory Visit: Payer: Self-pay

## 2024-02-29 MED ORDER — ESCITALOPRAM OXALATE 10 MG PO TABS
ORAL_TABLET | ORAL | 0 refills | Status: AC
Start: 2024-02-29 — End: 2024-03-28
  Filled 2024-02-29: qty 21, 28d supply, fill #0

## 2024-02-29 NOTE — Progress Notes (Signed)
 Reviewed After Visit Summary with medications to take at home. Verified that patient received discharge guide from night nurse. Offered patient the opportunity to ask questions about self-care for home or signs and symptoms of complications.

## 2024-02-29 NOTE — Plan of Care (Signed)
 Introduced myself to patient & spouse. Updated and reviewed white board with patient. Plan of care for shift focused on discharge.   Problem: Pain interferes with ability to perform ADL  Goal: Pain at adequate level as identified by patient  Outcome: Completed     Problem: Side Effects from Pain Analgesia  Goal: Patient will experience minimal side effects of analgesic therapy  Outcome: Completed     Problem: Vaginal/Cesarean Delivery  Goal: Postpartum management of pain/discomfort  Outcome: Completed  Goal: Breasts are soft with nipple integrity intact  Outcome: Completed  Goal: Gastrointestinal/Urinary management  Outcome: Completed  Goal: Uterine management  Outcome: Completed  Goal: Perineum will be clean, dry, and intact and without discharge or hematoma  Outcome: Completed  Goal: VTE Prevention  Outcome: Completed  Goal: Evidence of positive mother-baby interactions  Outcome: Completed     Problem: Moderate/High Fall Risk Score >5  Goal: Patient will remain free of falls  Outcome: Completed

## 2024-03-01 ENCOUNTER — Other Ambulatory Visit: Payer: Self-pay

## 2024-03-01 NOTE — UM Notes (Signed)
 Requesting final auth to be faxed to [phone] or call [phone]        Auth Number :  [id number]    Discharge Date -  02/29/2024 11:49 AM    IP ADMIT DATE:          Medication List        START taking these medications      docusate sodium  100 MG capsule  Commonly known as: COLACE  Take 2 capsules (200 mg) by mouth 2 (two) times daily     escitalopram  10 MG tablet  Commonly known as: LEXAPRO   Take 0.5 tablets (5 mg) by mouth once daily for 14 days, THEN 1 tablet (10 mg) once daily for 14 days.  Start taking on: February 29, 2024     ibuprofen  800 MG tablet  Commonly known as: ADVIL   Take 1 tablet (800 mg) by mouth every 8 (eight) hours     polyethylene glycol 17 g packet  Commonly known as: MIRALAX   Open the contents of 1 packet (17 g) and mix into 4 to 8 ounces of liquid and drink by mouth once daily as needed for constipation     traMADol  50 MG tablet  Commonly known as: ULTRAM   Take 1 tablet (50 mg) by mouth every 6 (six) hours as needed for Pain            CONTINUE taking these medications      acetaminophen  650 MG CR tablet  Commonly known as: TYLENOL      Advair  Diskus 500-50 MCG/ACT Aepb  Generic drug: fluticasone -salmeterol     * albuterol  sulfate HFA 108 (90 Base) MCG/ACT inhaler  Commonly known as: PROVENTIL      * albuterol  (2.5 MG/3ML) 0.083% nebulizer solution  Commonly known as: PROVENTIL   Take 3 mLs (2.5 mg) by nebulization every 4 (four) hours as needed for Wheezing or Shortness of Breath (coughing)     cetirizine  10 MG chewable tablet  Commonly known as: ZyrTEC      EPINEPHrine  0.3 MG/0.3ML auto-injector  Inject 0.3 mLs (0.3 mg total) into the muscle as needed (for Anaphylaxis)     lidocaine  5 %  Commonly known as: LIDODERM   Place 1 patch onto the skin once every 24 hours Remove & Discard patch within 12 hours or as directed by MD     Nebulizer Misc  Dispense one nebulizer machine to include mask and tubing           * This list has 2 medication(s) that are the same as other medications  prescribed for you. Read the directions carefully, and ask your doctor or other care provider to review them with you.                STOP taking these medications      benzonatate  100 MG capsule  Commonly known as: TESSALON      butalbital -acetaminophen -caffeine  50-325-40 MG per tablet  Commonly known as: FIORICET     clindamycin  300 MG capsule  Commonly known as: CLEOCIN      cyclobenzaprine  10 MG tablet  Commonly known as: FLEXERIL      famotidine  40 MG tablet  Commonly known as: PEPCID      metoclopramide  10 MG tablet  Commonly known as: REGLAN      montelukast  10 MG tablet  Commonly known as: SINGULAIR      ondansetron  4 MG disintegrating tablet  Commonly known as: ZOFRAN -ODT     pantoprazole 40 MG tablet  Commonly known as: PROTONIX     pramoxine  1 % foam  Commonly known as: ANUSOL , PROCTOFOAM               Where to Get Your Medications        These medications were sent to [facility]  [address], [place] [address]      Hours: Thanksgiving:11A-5P, M,T,W,F:9A-7P, Sa-Su:11A-5P Phone: [phone]   docusate sodium  100 MG capsule  escitalopram  10 MG tablet  ibuprofen  800 MG tablet  lidocaine  5 %  polyethylene glycol 17 g packet  traMADol  50 MG tablet             NAME: Catherine Spence             MR#: [id number]      Patient Address:  [address]  [place] [address]    Patient phone: [phone] (home)     PATIENT NAME: Catherine Spence, Catherine Spence  DOB: 1990/07/11   PMH:  has a past medical history of Anesthesia complication, Anxiety, Asthma, Bipolar 1 disorder (CMS/HCC), Chronic pain, COVID-19 (04/2020), Depression, Dysphagia, Ear, nose and throat disorder, Fracture, Gastroesophageal reflux disease, Head injury, Hearing loss, Heart murmur, Hernia, umbilical, Herpes, History of mixed drug abuse (CMS/HCC), HPV in female (2013), Hypertension, IBS (irritable bowel syndrome), Low back pain, Malignant neoplasm (CMS/HCC), Migraine, Other and unspecified ovarian cysts, Ovarian  cyst, Pollen allergies, Post-operative nausea and vomiting, Postpartum depression, PTSD (post-traumatic stress disorder), and Urinary tract infection.  PSH:  has a past surgical history that includes Vaginal delivery (10/04/2016); Tympanostomy tube placement; LEEP (N/A, 01/13/2017); ROBOT XI ASSISTED,LAPAROSCOPIC,HERNIA REPAIR,UMBILICAL (N/A, 12/19/2019); and Vaginal delivery.      DIAGNOSIS:     ICD-10-CM    1. Perineal laceration during delivery, postpartum condition  O70.9 traMADol  (ULTRAM ) 50 MG tablet     DISCONTINUED: traMADol  (ULTRAM ) 50 MG tablet      2. [redacted] weeks gestation of pregnancy  Z3A.35           Information sent by a UR Department Case Management Assistant

## 2024-03-04 NOTE — UM Notes (Signed)
 Requesting final auth to be faxed to [employer] [phone] and additional  information can be requested by calling  UR Main Line: [phone]    Patient Name: Catherine Spence       DOB: 1990-12-01  MRN: [id number]    Auth Number :  [id number]    Discharge Date -  02/29/2024 11:49 AM    ADMIT DATE AND TIME: 02/26/2024  9:30 PM  02/27/24 1446  ADMIT TO INPATIENT  Once        Diagnosis: [redacted] Weeks Gestation Of Pregnancy  Level of Care: Level 4 (Acute)  Patient Class: Inpatient   References:    Bedside Procedures    Levels of Care Summary Table    Levels of Care Observation Unit Placement    Inpatient Respiratory Guidelines    [facility] Respiratory Guidelines    New Service Definitions Feb 2023    Device and Therapies   Question Answer Comment   Admitting Physician KATHRIN, ROOPA M    Service: OB Post Partum    Estimated Length of Stay > or = to 2 midnights    Tentative Discharge Plan? Home or Self Care        02/27/24 1447   02/26/24 2216  ADMIT TO OBSERVATION (OUTPATIENT WITH OBSERVATION SERVICES)  Once                NAME: Catherine Spence             MR#: [id number]    Patient Address:  [address]  [place] [address]    Patient phone: [phone] (home)     PATIENT NAME: Catherine Spence  DOB: [birth year]   PMH:  has a past medical history of Anesthesia complication, Anxiety, Asthma, Bipolar 1 disorder (CMS/HCC), Chronic pain, COVID-19 (04/2020), Depression, Dysphagia, Ear, nose and throat disorder, Fracture, Gastroesophageal reflux disease, Head injury, Hearing loss, Heart murmur, Hernia, umbilical, Herpes, History of mixed drug abuse (CMS/HCC), HPV in female (2013), Hypertension, IBS (irritable bowel syndrome), Low back pain, Malignant neoplasm (CMS/HCC), Migraine, Other and unspecified ovarian cysts, Ovarian cyst, Pollen allergies, Post-operative nausea and vomiting, Postpartum depression, PTSD (post-traumatic stress disorder), and Urinary tract infection.  PSH:  has a past surgical history  that includes Vaginal delivery (10/04/2016); Tympanostomy tube placement; LEEP (N/A, 01/13/2017); ROBOT XI ASSISTED,LAPAROSCOPIC,HERNIA REPAIR,UMBILICAL (N/A, 12/19/2019); and Vaginal delivery.    BP 119/73   Pulse 71   Temp 98.4 F (36.9 C) (Oral)   Resp 16   Ht 1.676 m (5' 6)   Wt 127 kg (280 lb)   LMP 06/20/2023 (Exact Date)   SpO2 97%   Breastfeeding Unknown   BMI 45.19 kg/m         DIAGNOSIS:     ICD-10-CM    1. Perineal laceration during delivery, postpartum condition  O70.9 traMADol  (ULTRAM ) 50 MG tablet     DISCONTINUED: traMADol  (ULTRAM ) 50 MG tablet      2. [redacted] weeks gestation of pregnancy  Z3A.35         OB 02/28/24  Assessment:   Stable - Postpartum Day 1, doing well  Plan:   2 weeks for mood check (can be virtual)  6 weeks for postpartum check  Continue PNV, Tylenol , Motrin , and Tramadol    Written and verbal instructions given for incision care, warning signs, and what to do at home  Discharge home tomorrow AM if vital signs stable and no change in patient condition.  Brief IP summary: G7P4 at 36w IOL for maternal  PFTs 50%. Baby being followed d/t preterm and is pending car seat challenge in AM. And then they will be able to go home.   Pt wants to start Lexapro , discussed R/B, Rx placed. F/U with mood check 2w.       Pt discharged 02/29/24      This clinical review is based on compiled documentation provided by the treatment team within the patient's medical record.      UTILIZATION REVIEW CONTACT :   Grayce Molt, MSN, ACM-RN  Utilization Review RN ll   Finance/Revenue Cycle Department, Utilization Review   UR Dept O [phone]  UR Dept F [phone]  Office: [phone] (vm only)  C: [phone] (8a-5p)  [email or web address]      Tax ID: [id number]  [facility]   (IAH) [facility]   (IFH) [facility]   [facility]) [facility]   [facility]) [facility]  [facility])   [address]  [place], [place] [address] [address] [place] [address] [address]  [place], [place] [address] [phone] [facility].  [place], [place] [address] [address]  [place], [place] [address]   NPI: [id number] NPI: [id number] NPI: [id number] NPI: [id number] NPI: [id number]
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
